# Patient Record
Sex: Female | Born: 1990
Health system: Southern US, Community
[De-identification: ages and names within clinical notes are randomized; demographics above are authoritative.]

## PROBLEM LIST (undated history)

## (undated) ENCOUNTER — Inpatient Hospital Stay (HOSPITAL_COMMUNITY): Payer: Self-pay

## (undated) DIAGNOSIS — D573 Sickle-cell trait: Secondary | ICD-10-CM

## (undated) DIAGNOSIS — F4321 Adjustment disorder with depressed mood: Secondary | ICD-10-CM

## (undated) DIAGNOSIS — T4145XA Adverse effect of unspecified anesthetic, initial encounter: Secondary | ICD-10-CM

## (undated) DIAGNOSIS — S32009A Unspecified fracture of unspecified lumbar vertebra, initial encounter for closed fracture: Secondary | ICD-10-CM

## (undated) DIAGNOSIS — Z789 Other specified health status: Secondary | ICD-10-CM

## (undated) DIAGNOSIS — F4310 Post-traumatic stress disorder, unspecified: Secondary | ICD-10-CM

## (undated) DIAGNOSIS — S329XXA Fracture of unspecified parts of lumbosacral spine and pelvis, initial encounter for closed fracture: Secondary | ICD-10-CM

## (undated) DIAGNOSIS — T8859XA Other complications of anesthesia, initial encounter: Secondary | ICD-10-CM

## (undated) DIAGNOSIS — S129XXA Fracture of neck, unspecified, initial encounter: Secondary | ICD-10-CM

## (undated) DIAGNOSIS — R87612 Low grade squamous intraepithelial lesion on cytologic smear of cervix (LGSIL): Secondary | ICD-10-CM

## (undated) DIAGNOSIS — S069XAA Unspecified intracranial injury with loss of consciousness status unknown, initial encounter: Secondary | ICD-10-CM

## (undated) DIAGNOSIS — S069X9A Unspecified intracranial injury with loss of consciousness of unspecified duration, initial encounter: Secondary | ICD-10-CM

## (undated) DIAGNOSIS — O3440 Maternal care for other abnormalities of cervix, unspecified trimester: Secondary | ICD-10-CM

---
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
## 2011-11-01 ENCOUNTER — Encounter (HOSPITAL_COMMUNITY): Payer: Self-pay | Admitting: Obstetrics and Gynecology

## 2011-11-01 ENCOUNTER — Inpatient Hospital Stay (HOSPITAL_COMMUNITY)
Admission: AD | Admit: 2011-11-01 | Discharge: 2011-11-01 | Disposition: A | Discharge status: Home / Self Care | Payer: Medicaid Other | Source: Ambulatory Visit | Attending: Obstetrics & Gynecology | Admitting: Obstetrics & Gynecology

## 2011-11-01 DIAGNOSIS — Z3202 Encounter for pregnancy test, result negative: Secondary | ICD-10-CM | POA: Insufficient documentation

## 2011-11-01 DIAGNOSIS — K5900 Constipation, unspecified: Secondary | ICD-10-CM

## 2011-11-01 HISTORY — DX: Other specified health status: Z78.9

## 2011-11-01 NOTE — MAU Provider Note (Signed)
History     CSN: [id number]  Arrival date and time: 11/01/11 1319   None     Chief Complaint  Patient presents with  . Possible Pregnancy  . Constipation   [name] 21 y.o. G0P0000   HPI  Possible pregnancy- Patient presents today to be tested for pregnancy. She reports that her abdomen has been getting bigger for about 1 month. She is also having pelvic cramping at night. She is current on the depo shot. In August she had 1 episode of vaginal bleeding and pink/orange color when she wiped. Because she has never bled with the depo she thought she was pregnant and took 2 home pregnancy tests that were positive. She went to the health department in September and was given her next depo shot. She is currently sexually active with 1 female partner. She has no concerns for STIs.  Patient has reported to the nurse that she has wanted to become pregnant.   Constipation- Patient has been having difficult bowel movements over the last month. She has had straining that at times feels like she is straining through her vaginal area. She reports having 1-2 BM per day and has had one today. She has also had bright red blood on her stool. She has had no rectal pain or itching.     OB History    Grav Para Term Preterm Abortions TAB SAB Ect Mult Living   0 0 0 0 0 0 0 0 0 0       Past Medical History  Diagnosis Date  . No pertinent past medical history     Past Surgical History  Procedure Date  . No past surgeries     No family history on file.  History  Substance Use Topics  . Smoking status: Never Smoker   . Smokeless tobacco: Not on file  . Alcohol Use: No    Allergies: No Known Allergies  Prescriptions prior to admission  Medication Sig Dispense Refill  . medroxyPROGESTERone (DEPO-PROVERA) 150 MG/ML injection Inject 150 mg into the muscle every 3 (three) months. Patient had in Sept.2013        Review of Systems  Constitutional: Negative for fever, chills and  malaise/fatigue.  Eyes: Negative for blurred vision and double vision.  Respiratory: Negative for shortness of breath.   Cardiovascular: Negative for chest pain, palpitations and leg swelling.  Gastrointestinal: Positive for nausea, vomiting, abdominal pain, constipation and blood in stool. Negative for diarrhea and melena.  Genitourinary: Negative for dysuria, urgency and frequency.  Musculoskeletal: Negative for myalgias.  Neurological: Negative for dizziness, loss of consciousness, weakness and headaches.   Physical Exam   Blood pressure 119/68, pulse 88, temperature 98.5 F (36.9 C), temperature source Oral, resp. rate 18, height 5\' 8"  (1.727 m), weight 73.936 kg (163 lb).  Physical Exam  Nursing note and vitals reviewed. Constitutional: She is oriented to person, place, and time. She appears well-developed and well-nourished. No distress.  HENT:  Head: Normocephalic and atraumatic.  Eyes: Conjunctivae normal and EOM are normal. No scleral icterus.  Neck: Normal range of motion. Neck supple.  Cardiovascular: Normal rate, regular rhythm, normal heart sounds and intact distal pulses.  Exam reveals no gallop and no friction rub.   No murmur heard. Respiratory: Effort normal and breath sounds normal. No respiratory distress. She has no wheezes. She has no rales.  GI: Soft. Bowel sounds are normal. She exhibits no distension and no mass. There is no hepatosplenomegaly. There is no tenderness. There is  no rebound and no guarding.  Genitourinary: Rectum normal and uterus normal. Uterus is not enlarged and not tender.       No stool felt in rectal vault.   Musculoskeletal: Normal range of motion. She exhibits no edema and no tenderness.  Neurological: She is alert and oriented to person, place, and time.  Skin: Skin is warm and dry. No rash noted. She is not diaphoretic. No erythema.    MAU Course  Procedures  Results for orders placed during the hospital encounter of 11/01/11 (from  the past 24 hour(s))  POCT PREGNANCY, URINE     Status: Normal   Collection Time   11/01/11  1:48 PM      Component Value Range   Preg Test, Ur NEGATIVE  NEGATIVE    Urine pregnancy test was negative. We offered to do a blood pregnancy test however the patient declined. Patient expressed to the nurse that she had wanted to become pregnant. She now appears upset that she is not.   Assessment and Plan  Negative pregnancy- Urine pregnancy test indicated that the patient is not pregnant. She is visibly upset and declined having any further testing. Patient will be discharged.   Constipation- Patient is having symptoms of constipation. There were no signs of acute abdominal abnormality. She was instructed to try using Colace.   Winfield Cunas 11/01/2011, 2:48 PM   Evaluation and management procedures were performed by PA-S under my supervision/collaboration. Chart reviewed, patient examined by me and I agree with management and plan. spoke with her regarding desire to become pregnant and stopping Depo. I offerred her a blood test for pregnancy and she declined. Danae Orleans, CNM 11/01/2011 3:59 PM

## 2011-11-01 NOTE — MAU Note (Signed)
Pt presents to MAU with chief complaint of constipation and concerns about pregnancy. Pt is using depo for birth control and is concerned that she is pregnant. Pt says her stomach seems bigger and has been having a difficult time using the bathroom to have a bowel movement.  Pt had a BM yesterday that was hard and difficult to pass.  Pt denies bleeding at this time. Requests blood test for pregnancy

## 2011-11-01 NOTE — MAU Note (Signed)
Past 2 months stomach has been getting bigger. Has been on depo (last was 09/13). Never had periods, had some bleeding- in August.  Went in Sept, test was negative (pee test), got another depo.  Is now concerned that she may be pregnant.  occ has cramping- none today.

## 2012-06-22 ENCOUNTER — Emergency Department (INDEPENDENT_AMBULATORY_CARE_PROVIDER_SITE_OTHER)
Admission: EM | Admit: 2012-06-22 | Discharge: 2012-06-22 | Disposition: A | Discharge status: Home / Self Care | Payer: Self-pay | Source: Home / Self Care | Attending: Family Medicine | Admitting: Family Medicine

## 2012-06-22 ENCOUNTER — Encounter (HOSPITAL_COMMUNITY): Payer: Self-pay | Admitting: *Deleted

## 2012-06-22 DIAGNOSIS — L42 Pityriasis rosea: Secondary | ICD-10-CM

## 2012-06-22 MED ORDER — LORATADINE 10 MG PO TABS: 10.0000 mg | ORAL_TABLET | Freq: Every day | ORAL | Status: DC

## 2012-06-22 NOTE — ED Notes (Addendum)
C/o rash started on L upper arm on Monday and then spread all over her body with itching.  Pt. Thought it was an allergic reaction.  It appears like Pityriasis Rosea. Accidentally charted under Nira Conn RN name. Vassie Moselle 06/22/2012

## 2012-06-22 NOTE — ED Provider Notes (Signed)
History     CSN: [id number]  Arrival date & time 06/22/12  1507   First MD Initiated Contact with Patient 06/22/12 1646      Chief Complaint  Patient presents with  . Rash    (Consider location/radiation/quality/duration/timing/severity/associated sxs/prior treatment) HPI Comments: Pt developed rash 5 days ago. First a large patch on L upper inner arm appeared, then hundreds of smaller lesions appeared on the rest of her body.  The lesions are mildly itchy. No other aggravating factors or associated sx.   Patient is a 22 y.o. female presenting with rash. The history is provided by the patient.  Rash Relieved by:  None tried Worsened by:  Nothing tried Ineffective treatments:  None tried Associated symptoms: no chills and no fever     Past Medical History  Diagnosis Date  . No pertinent past medical history     Past Surgical History  Procedure Laterality Date  . No past surgeries      History reviewed. No pertinent family history.  History  Substance Use Topics  . Smoking status: Never Smoker   . Smokeless tobacco: Not on file  . Alcohol Use: No    OB History   Grav Para Term Preterm Abortions TAB SAB Ect Mult Living   0 0 0 0 0 0 0 0 0 0       Review of Systems  Constitutional: Negative for fever and chills.  Skin: Positive for rash.    Allergies  Review of patient's allergies indicates no known allergies.  Home Medications   Current Outpatient Rx  Name  Route  Sig  Dispense  Refill  . loratadine (CLARITIN) 10 MG tablet   Oral   Take 1 tablet (10 mg total) by mouth daily.   30 tablet   2   . medroxyPROGESTERone (DEPO-PROVERA) 150 MG/ML injection   Intramuscular   Inject 150 mg into the muscle every 3 (three) months. Patient had in Sept.2013           BP 108/65  Pulse 72  Temp(Src) 98.9 F (37.2 C) (Oral)  Resp 16  SpO2 100%  Physical Exam  Constitutional: She appears well-developed and well-nourished. No distress.  Skin: Skin is warm  and dry. Rash noted. Rash is maculopapular.  Larger 2cm diameter patch on L upper inner arm. Hundreds of scattered smaller lesions on rest of body including face.  Rash is slightly raised, some with scaly border. Christmas tree pattern is noted in rash distribution on back.  C/w pityriasis rosea.     ED Course  Procedures (including critical care time)  Labs Reviewed - No data to display No results found.   1. Pityriasis rosea       MDM  rx claritin 10mg  po daily #30, 2 refills.        Cathlyn Parsons, NP 06/22/12 1655

## 2012-06-23 NOTE — ED Provider Notes (Signed)
Medical screening examination/treatment/procedure(s) were performed by resident physician or non-physician practitioner and as supervising physician I was immediately available for consultation/collaboration.   Terrin Meddaugh DOUGLAS MD.   Brando Taves D Cane Dubray, MD 06/23/12 1109 

## 2013-06-21 ENCOUNTER — Inpatient Hospital Stay (HOSPITAL_COMMUNITY)
Admission: AD | Admit: 2013-06-21 | Discharge: 2013-06-21 | Disposition: A | Discharge status: Home / Self Care | Payer: Medicaid Other | Source: Ambulatory Visit | Attending: Obstetrics & Gynecology | Admitting: Obstetrics & Gynecology

## 2013-06-21 ENCOUNTER — Encounter (HOSPITAL_COMMUNITY): Payer: Self-pay | Admitting: *Deleted

## 2013-06-21 DIAGNOSIS — N390 Urinary tract infection, site not specified: Secondary | ICD-10-CM | POA: Insufficient documentation

## 2013-06-21 DIAGNOSIS — R109 Unspecified abdominal pain: Secondary | ICD-10-CM | POA: Insufficient documentation

## 2013-06-21 DIAGNOSIS — Z3202 Encounter for pregnancy test, result negative: Secondary | ICD-10-CM | POA: Insufficient documentation

## 2013-06-21 DIAGNOSIS — Z87891 Personal history of nicotine dependence: Secondary | ICD-10-CM | POA: Insufficient documentation

## 2013-06-21 LAB — URINALYSIS, ROUTINE W REFLEX MICROSCOPIC
BILIRUBIN URINE: NEGATIVE
Glucose, UA: NEGATIVE mg/dL
KETONES UR: NEGATIVE mg/dL
NITRITE: POSITIVE — AB
PH: 6 (ref 5.0–8.0)
Protein, ur: NEGATIVE mg/dL
Specific Gravity, Urine: 1.01 (ref 1.005–1.030)
Urobilinogen, UA: 0.2 mg/dL (ref 0.0–1.0)

## 2013-06-21 LAB — POCT PREGNANCY, URINE: PREG TEST UR: NEGATIVE

## 2013-06-21 LAB — ABO/RH: ABO/RH(D): A POS

## 2013-06-21 LAB — CBC
HEMATOCRIT: 29.3 % — AB (ref 36.0–46.0)
HEMOGLOBIN: 10.7 g/dL — AB (ref 12.0–15.0)
MCH: 30.6 pg (ref 26.0–34.0)
MCHC: 36.5 g/dL — ABNORMAL HIGH (ref 30.0–36.0)
MCV: 83.7 fL (ref 78.0–100.0)
PLATELETS: 324 10*3/uL (ref 150–400)
RBC: 3.5 MIL/uL — AB (ref 3.87–5.11)
RDW: 15 % (ref 11.5–15.5)
WBC: 13.2 10*3/uL — AB (ref 4.0–10.5)

## 2013-06-21 LAB — URINE MICROSCOPIC-ADD ON

## 2013-06-21 LAB — HCG, QUANTITATIVE, PREGNANCY: hCG, Beta Chain, Quant, S: 1 m[IU]/mL (ref <5)

## 2013-06-21 MED ORDER — CIPROFLOXACIN HCL 500 MG PO TABS: 500.0000 mg | ORAL_TABLET | Freq: Two times a day (BID) | ORAL | Status: DC

## 2013-06-21 NOTE — MAU Note (Addendum)
PT  SAYS SHER LMP  WAS 5-2.    SHE HAS DONE  2 HPT-    POSITIVE.     VOMITING   STARTED TODAY.  LAST SEX-   YESTERDAY.  LAST DEPO -  WAS 2014.    NO DR-  [facility]  [facility]-   DONE LAST YEAR.    CRAMPING  STARTED AT 0300  WHEN SHE STARTED VOMITING.

## 2013-06-21 NOTE — Discharge Instructions (Signed)
Urinary Tract Infection  Urinary tract infections (UTIs) can develop anywhere along your urinary tract. Your urinary tract is your body's drainage system for removing wastes and extra water. Your urinary tract includes two kidneys, two ureters, a bladder, and a urethra. Your kidneys are a pair of bean-shaped organs. Each kidney is about the size of your fist. They are located below your ribs, one on each side of your spine.  CAUSES  Infections are caused by microbes, which are microscopic organisms, including fungi, viruses, and bacteria. These organisms are so small that they can only be seen through a microscope. Bacteria are the microbes that most commonly cause UTIs.  SYMPTOMS   Symptoms of UTIs may vary by age and gender of the patient and by the location of the infection. Symptoms in young women typically include a frequent and intense urge to urinate and a painful, burning feeling in the bladder or urethra during urination. Older women and men are more likely to be tired, shaky, and weak and have muscle aches and abdominal pain. A fever may mean the infection is in your kidneys. Other symptoms of a kidney infection include pain in your back or sides below the ribs, nausea, and vomiting.  DIAGNOSIS  To diagnose a UTI, your caregiver will ask you about your symptoms. Your caregiver also will ask to provide a urine sample. The urine sample will be tested for bacteria and white blood cells. White blood cells are made by your body to help fight infection.  TREATMENT   Typically, UTIs can be treated with medication. Because most UTIs are caused by a bacterial infection, they usually can be treated with the use of antibiotics. The choice of antibiotic and length of treatment depend on your symptoms and the type of bacteria causing your infection.  HOME CARE INSTRUCTIONS   If you were prescribed antibiotics, take them exactly as your caregiver instructs you. Finish the medication even if you feel better after you  have only taken some of the medication.   Drink enough water and fluids to keep your urine clear or pale yellow.   Avoid caffeine, tea, and carbonated beverages. They tend to irritate your bladder.   Empty your bladder often. Avoid holding urine for long periods of time.   Empty your bladder before and after sexual intercourse.   After a bowel movement, women should cleanse from front to back. Use each tissue only once.  SEEK MEDICAL CARE IF:    You have back pain.   You develop a fever.   Your symptoms do not begin to resolve within 3 days.  SEEK IMMEDIATE MEDICAL CARE IF:    You have severe back pain or lower abdominal pain.   You develop chills.   You have nausea or vomiting.   You have continued burning or discomfort with urination.  MAKE SURE YOU:    Understand these instructions.   Will watch your condition.   Will get help right away if you are not doing well or get worse.  Document Released: 10/20/2004 Document Revised: 07/12/2011 Document Reviewed: 02/18/2011  Exit[employer] Patient Information 2014 [employer].

## 2013-06-21 NOTE — MAU Provider Note (Signed)
History     CSN: [id number]  Arrival date and time: 06/21/13 0786   First Provider Initiated Contact with Patient 06/21/13 [phone]      No chief complaint on file.  HPI [name] is a 23 y.o. G1P0000 who presents today with lower abdominal pain. She states that she took a pregnancy test at home, and it was positive. Her LMP was 05/25/13.   Past Medical History  Diagnosis Date  . No pertinent past medical history   . Medical history non-contributory     Past Surgical History  Procedure Laterality Date  . No past surgeries      History reviewed. No pertinent family history.  History  Substance Use Topics  . Smoking status: Former Games developer  . Smokeless tobacco: Not on file  . Alcohol Use: No    Allergies: No Known Allergies  Prescriptions prior to admission  Medication Sig Dispense Refill  . loratadine (CLARITIN) 10 MG tablet Take 1 tablet (10 mg total) by mouth daily.  30 tablet  2  . medroxyPROGESTERone (DEPO-PROVERA) 150 MG/ML injection Inject 150 mg into the muscle every 3 (three) months. Patient had in Sept.2013        ROS Physical Exam   Blood pressure 114/66, pulse 87, temperature 98.4 F (36.9 C), temperature source Oral, resp. rate 20, height 5\' 8"  (1.727 m), weight 82.555 kg (182 lb), last menstrual period 05/25/2013, unknown if currently breastfeeding.  Physical Exam  Nursing note and vitals reviewed. Constitutional: She is oriented to person, place, and time. She appears well-developed and well-nourished. No distress.  Cardiovascular: Normal rate.   Respiratory: Effort normal.  GI: Soft. There is no tenderness. There is no rebound.  Neurological: She is alert and oriented to person, place, and time.  Skin: Skin is warm and dry.  Psychiatric: She has a normal mood and affect.    MAU Course  Procedures  Results for orders placed during the hospital encounter of 06/21/13 (from the past 24 hour(s))  URINALYSIS, ROUTINE W REFLEX MICROSCOPIC     Status:  Abnormal   Collection Time    06/21/13  5:30 AM      Result Value Ref Range   Color, Urine YELLOW  YELLOW   APPearance HAZY (*) CLEAR   Specific Gravity, Urine 1.010  1.005 - 1.030   pH 6.0  5.0 - 8.0   Glucose, UA NEGATIVE  NEGATIVE mg/dL   Hgb urine dipstick TRACE (*) NEGATIVE   Bilirubin Urine NEGATIVE  NEGATIVE   Ketones, ur NEGATIVE  NEGATIVE mg/dL   Protein, ur NEGATIVE  NEGATIVE mg/dL   Urobilinogen, UA 0.2  0.0 - 1.0 mg/dL   Nitrite POSITIVE (*) NEGATIVE   Leukocytes, UA SMALL (*) NEGATIVE  URINE MICROSCOPIC-ADD ON     Status: Abnormal   Collection Time    06/21/13  5:30 AM      Result Value Ref Range   Squamous Epithelial / LPF MANY (*) RARE   WBC, UA 7-10  <3 WBC/hpf   Bacteria, UA MANY (*) RARE  HCG, QUANTITATIVE, PREGNANCY     Status: None   Collection Time    06/21/13  6:00 AM      Result Value Ref Range   hCG, Beta Chain, Quant, S 1  <5 mIU/mL  CBC     Status: Abnormal   Collection Time    06/21/13  6:00 AM      Result Value Ref Range   WBC 13.2 (*) 4.0 - 10.5 K/uL  RBC 3.50 (*) 3.87 - 5.11 MIL/uL   Hemoglobin 10.7 (*) 12.0 - 15.0 g/dL   HCT 16.129.3 (*) 09.636.0 - 04.546.0 %   MCV 83.7  78.0 - 100.0 fL   MCH 30.6  26.0 - 34.0 pg   MCHC 36.5 (*) 30.0 - 36.0 g/dL   RDW 40.915.0  81.111.5 - 91.415.5 %   Platelets 324  150 - 400 K/uL  ABO/RH     Status: None   Collection Time    06/21/13  6:00 AM      Result Value Ref Range   ABO/RH(D) A POS       Assessment and Plan   1. UTI (lower urinary tract infection)      Medication List         ciprofloxacin 500 MG tablet  Commonly known as:  CIPRO  Take 1 tablet (500 mg total) by mouth 2 (two) times daily.       Follow-up Information   Follow up with [facility]. (If symptoms worsen)    Specialty:  Emergency Medicine   Contact information:   [id number] [place] [id number] [phone]       Tawnya CrookHeather Donovan Hogan 06/21/2013, 7:12 AM

## 2013-06-21 NOTE — MAU Note (Signed)
NOT IN LOBBBY

## 2013-06-26 NOTE — MAU Provider Note (Signed)
Attestation of Attending Supervision of Advanced Practitioner (CNM/NP): Evaluation and management procedures were performed by the Advanced Practitioner under my supervision and collaboration. I have reviewed the Advanced Practitioner's note and chart, and I agree with the management and plan.  Lesly Dukes 9:57 AM

## 2013-11-25 ENCOUNTER — Encounter (HOSPITAL_COMMUNITY): Payer: Self-pay | Admitting: *Deleted

## 2014-06-11 ENCOUNTER — Encounter (HOSPITAL_COMMUNITY): Payer: Self-pay | Admitting: *Deleted

## 2014-06-11 ENCOUNTER — Emergency Department (HOSPITAL_COMMUNITY)
Admission: EM | Admit: 2014-06-11 | Discharge: 2014-06-11 | Disposition: A | Discharge status: Home / Self Care | Payer: BLUE CROSS/BLUE SHIELD | Attending: Emergency Medicine | Admitting: Emergency Medicine

## 2014-06-11 DIAGNOSIS — K088 Other specified disorders of teeth and supporting structures: Secondary | ICD-10-CM | POA: Diagnosis present

## 2014-06-11 DIAGNOSIS — Z87891 Personal history of nicotine dependence: Secondary | ICD-10-CM | POA: Diagnosis not present

## 2014-06-11 DIAGNOSIS — K0520 Aggressive periodontitis, unspecified: Secondary | ICD-10-CM | POA: Insufficient documentation

## 2014-06-11 DIAGNOSIS — Z792 Long term (current) use of antibiotics: Secondary | ICD-10-CM | POA: Diagnosis not present

## 2014-06-11 MED ORDER — AMOXICILLIN 500 MG PO CAPS: 500.0000 mg | ORAL_CAPSULE | Freq: Three times a day (TID) | ORAL | Status: DC

## 2014-06-11 MED ORDER — NAPROXEN 500 MG PO TABS: 500.0000 mg | ORAL_TABLET | Freq: Two times a day (BID) | ORAL | Status: DC

## 2014-06-11 MED ORDER — HYDROCODONE-ACETAMINOPHEN 5-325 MG PO TABS: 1.0000 | ORAL_TABLET | Freq: Four times a day (QID) | ORAL | Status: DC | PRN

## 2014-06-11 NOTE — ED Provider Notes (Signed)
CSN: [id number]     Arrival date & time 06/11/14  1501 History  This chart was scribed for Kerrie BuffaloHope Neese, NP working with No att. providers found by Elveria Risingimelie Horne, ED Scribe. This patient was seen in room TR06C/TR06C and the patient's care was started at 3:14 PM.   Chief Complaint  Patient presents with  . Dental Pain   The history is provided by the patient. No language interpreter was used.   HPI Comments: Margart SicklesJasmine Delahunt is a 24 y.o. female who presents to the Emergency Department complaining of dental pain at lower left wisdom tooth. Patient reports severe pain upon wakening this morning mildly relieved with treatment of Advil. Patient states that she experiences her worst pain at night which endures into the morning. Patient denies recent cold symptoms including cough, congestion, ear pain, or sore throat. Patient denies additional medical complaints.    Past Medical History  Diagnosis Date  . No pertinent past medical history   . Medical history non-contributory    Past Surgical History  Procedure Laterality Date  . No past surgeries     History reviewed. No pertinent family history. History  Substance Use Topics  . Smoking status: Former Games developermoker  . Smokeless tobacco: Not on file  . Alcohol Use: No   OB History    Gravida Para Term Preterm AB TAB SAB Ectopic Multiple Living   1 0 0 0 0 0 0 0 0 0      Review of Systems  Constitutional: Negative for fever and chills.  HENT: Positive for dental problem. Negative for ear pain, sore throat and trouble swallowing.   Gastrointestinal: Negative for nausea, vomiting and diarrhea.  Musculoskeletal: Negative for neck pain.  Neurological: Negative for headaches.      Allergies  Review of patient's allergies indicates no known allergies.  Home Medications   Prior to Admission medications   Medication Sig Start Date End Date Taking? Authorizing Provider  amoxicillin (AMOXIL) 500 MG capsule Take 1 capsule (500 mg total) by mouth 3  (three) times daily. 06/11/14   [name], NP  ciprofloxacin (CIPRO) 500 MG tablet Take 1 tablet (500 mg total) by mouth 2 (two) times daily. 06/21/13   Armando ReichertHeather D Hogan, CNM  HYDROcodone-acetaminophen (NORCO) 5-325 MG per tablet Take 1 tablet by mouth every 6 (six) hours as needed for moderate pain. 06/11/14   [name], NP  naproxen (NAPROSYN) 500 MG tablet Take 1 tablet (500 mg total) by mouth 2 (two) times daily. 06/11/14   [name], NP   BP 119/61 mmHg  Pulse 74  Temp(Src) 98.9 F (37.2 C) (Oral)  Resp 22  SpO2 99% Physical Exam  Constitutional: She is oriented to person, place, and time. She appears well-developed and well-nourished.  HENT:  Head: Atraumatic.  Right Ear: Tympanic membrane normal.  Left Ear: Tympanic membrane normal.  Mouth/Throat: Oropharynx is clear and moist.  Wisdom tooth partially erupted with red, swollen surrounding gum.  Eyes: EOM are normal.  Neck: Neck supple.  Anterior cervical node, palpable on left.   Cardiovascular: Normal rate and regular rhythm.   Pulmonary/Chest: Effort normal and breath sounds normal.  Abdominal: Soft. There is no tenderness.  Musculoskeletal: Normal range of motion.  Neurological: She is alert and oriented to person, place, and time. No cranial nerve deficit.  Skin: Skin is warm and dry.  Nursing note and vitals reviewed.   ED Course  Procedures (including critical care time)  COORDINATION OF CARE: 3:22 PM- will  prescribe antibiotic and narcotic. Discussed treatment plan with patient at bedside and patient agreed to plan.    MDM  24 y.o. female with dental pain due to partial eruption of the lower left third molar that appears to have infection. Stable for d/c without fever and does not appear toxic. Discussed with the patient clinical findings and plan of care and all questioned fully answered. She will follow up with a dentist or return here if any problems arise.  Final diagnoses:  Acute pericoronitis   I  personally performed the services described in this documentation, which was scribed in my presence. The recorded information has been reviewed and is accurate.    [address] Neese, NP 06/11/14 1806  Eber HongBrian Miller, MD 06/12/14 [phone]

## 2014-06-11 NOTE — ED Notes (Signed)
Pt c/o left lower dental pain. No other complaints.

## 2014-06-11 NOTE — Discharge Instructions (Signed)
See a dentist as soon as possible. Do not drive or go to school when taking the narcotic.

## 2014-07-16 ENCOUNTER — Emergency Department (INDEPENDENT_AMBULATORY_CARE_PROVIDER_SITE_OTHER)
Admission: EM | Admit: 2014-07-16 | Discharge: 2014-07-16 | Disposition: A | Discharge status: Home / Self Care | Payer: BLUE CROSS/BLUE SHIELD | Source: Home / Self Care | Attending: Family Medicine | Admitting: Family Medicine

## 2014-07-16 ENCOUNTER — Encounter (HOSPITAL_COMMUNITY): Payer: Self-pay | Admitting: Emergency Medicine

## 2014-07-16 DIAGNOSIS — K088 Other specified disorders of teeth and supporting structures: Secondary | ICD-10-CM

## 2014-07-16 DIAGNOSIS — K0889 Other specified disorders of teeth and supporting structures: Secondary | ICD-10-CM

## 2014-07-16 MED ORDER — NAPROXEN 500 MG PO TABS: 500.0000 mg | ORAL_TABLET | Freq: Two times a day (BID) | ORAL | Status: DC

## 2014-07-16 MED ORDER — HYDROCODONE-ACETAMINOPHEN 5-325 MG PO TABS: 1.0000 | ORAL_TABLET | Freq: Four times a day (QID) | ORAL | Status: DC | PRN

## 2014-07-16 MED ORDER — AMOXICILLIN 500 MG PO CAPS: 500.0000 mg | ORAL_CAPSULE | Freq: Three times a day (TID) | ORAL | Status: DC

## 2014-07-16 NOTE — ED Provider Notes (Signed)
[name] is a 24 y.o. female who presents to Urgent Care today for tooth pain. Patient has severe left lower tooth pain. She was seen in the emergency department a month ago for the same problem and was treated with amoxicillin naproxen and a small amount of hydrocodone. This helped until recently the pain came back. No fevers or chills. She does not have dental insurance nor dentist. She has not attempted to contact the dentist.   Past Medical History  Diagnosis Date  . No pertinent past medical history   . Medical history non-contributory    Past Surgical History  Procedure Laterality Date  . No past surgeries     History  Substance Use Topics  . Smoking status: Former Games developer  . Smokeless tobacco: Not on file  . Alcohol Use: No   ROS as above Medications: No current facility-administered medications for this encounter.   Current Outpatient Prescriptions  Medication Sig Dispense Refill  . amoxicillin (AMOXIL) 500 MG capsule Take 1 capsule (500 mg total) by mouth 3 (three) times daily. 30 capsule 0  . HYDROcodone-acetaminophen (NORCO) 5-325 MG per tablet Take 1 tablet by mouth every 6 (six) hours as needed for moderate pain. 6 tablet 0  . naproxen (NAPROSYN) 500 MG tablet Take 1 tablet (500 mg total) by mouth 2 (two) times daily. 30 tablet 0   No Known Allergies   Exam:  BP 116/74 mmHg  Pulse 105  Temp(Src) 99.1 F (37.3 C) (Oral)  Resp 12  SpO2 97% Gen: Well NAD HEENT: EOMI,  MMM l just posterior to the left lower rear most molar tender area. Lungs: Normal work of breathing. CTABL Heart: RRR no MRG Abd: NABS, Soft. Nondistended, Nontender Exts: Brisk capillary refill, warm and well perfused.   No results found for this or any previous visit (from the past 24 hour(s)). No results found.  Assessment and Plan: 24 y.o. female with tooth pain likely impacted wisdom tooth. Treat with amoxicillin naproxen and 6 tablets of hydrocodone. Gave patient information for  dentist. Informed her that this problem will continue to recur until she gets it addressed definitively.  Discussed warning signs or symptoms. Please see discharge instructions. Patient expresses understanding.     Rodolph Bong, MD 07/16/14 [phone]

## 2014-07-16 NOTE — Discharge Instructions (Signed)
Thank you for coming in today. Call or go to the emergency room if you get worse, have trouble breathing, have chest pains, or palpitations.  Please contact a dentist ASAP.  This problem will keep coming back until you get it taking care of.    Dental Pain A tooth ache may be caused by cavities (tooth decay). Cavities expose the nerve of the tooth to air and hot or cold temperatures. It may come from an infection or abscess (also called a boil or furuncle) around your tooth. It is also often caused by dental caries (tooth decay). This causes the pain you are having. DIAGNOSIS  Your caregiver can diagnose this problem by exam. TREATMENT   If caused by an infection, it may be treated with medications which kill germs (antibiotics) and pain medications as prescribed by your caregiver. Take medications as directed.  Only take over-the-counter or prescription medicines for pain, discomfort, or fever as directed by your caregiver.  Whether the tooth ache today is caused by infection or dental disease, you should see your dentist as soon as possible for further care. SEEK MEDICAL CARE IF: The exam and treatment you received today has been provided on an emergency basis only. This is not a substitute for complete medical or dental care. If your problem worsens or new problems (symptoms) appear, and you are unable to meet with your dentist, call or return to this location. SEEK IMMEDIATE MEDICAL CARE IF:   You have a fever.  You develop redness and swelling of your face, jaw, or neck.  You are unable to open your mouth.  You have severe pain uncontrolled by pain medicine. MAKE SURE YOU:   Understand these instructions.  Will watch your condition.  Will get help right away if you are not doing well or get worse. Document Released: 01/10/2005 Document Revised: 04/04/2011 Document Reviewed: 08/29/2007 [facility] Patient Information 2015 [place], [place]. This information is not intended to  replace advice given to you by your health care provider. Make sure you discuss any questions you have with your health care provider.   Proofreader Services:  [facility] [phone] ([phone])  [phone] [address]  Please call Dr. Lawrence Marseilles office [phone] or cell [phone] [address] [place]  Cost for tooth removal $200 includes exam, Xray, and extraction and follow up visit.  Bring list of current medications with you.   [facility] - [address] [phone]  2100 [facility]  [address] [place], [place], [place], [address]  [phone], Ext. [phone]  2nd and 4th Thursday of the month at 6:30am (Simple extractions only - no wisdom teeth or surgery) First come/First serve -First 10 clients served  [facility] [place], [place] and [place] residents only)  [address] [place], [place], [address]  [phone]  [facility]  [phone]  [facility]  [phone]  [facility] Department - [facility]  [phone]  Please call [facility] at [phone] to get the details to get your tooth pulled.

## 2014-07-16 NOTE — ED Notes (Signed)
Pt was treated a month ago in the ED for tooth pain.  She completed all medications and the pain and swelling has returned.  She has pain in her left lower jaw.

## 2014-08-18 ENCOUNTER — Encounter (HOSPITAL_COMMUNITY): Payer: Self-pay | Admitting: Emergency Medicine

## 2014-08-18 ENCOUNTER — Emergency Department (HOSPITAL_COMMUNITY)
Admission: EM | Admit: 2014-08-18 | Discharge: 2014-08-18 | Disposition: A | Discharge status: Home / Self Care | Payer: BLUE CROSS/BLUE SHIELD | Attending: Emergency Medicine | Admitting: Emergency Medicine

## 2014-08-18 DIAGNOSIS — Z3202 Encounter for pregnancy test, result negative: Secondary | ICD-10-CM | POA: Diagnosis not present

## 2014-08-18 DIAGNOSIS — B9689 Other specified bacterial agents as the cause of diseases classified elsewhere: Secondary | ICD-10-CM

## 2014-08-18 DIAGNOSIS — N949 Unspecified condition associated with female genital organs and menstrual cycle: Secondary | ICD-10-CM

## 2014-08-18 DIAGNOSIS — N72 Inflammatory disease of cervix uteri: Secondary | ICD-10-CM

## 2014-08-18 DIAGNOSIS — R350 Frequency of micturition: Secondary | ICD-10-CM | POA: Diagnosis present

## 2014-08-18 DIAGNOSIS — Z87891 Personal history of nicotine dependence: Secondary | ICD-10-CM | POA: Insufficient documentation

## 2014-08-18 DIAGNOSIS — N760 Acute vaginitis: Secondary | ICD-10-CM

## 2014-08-18 LAB — URINALYSIS, ROUTINE W REFLEX MICROSCOPIC
BILIRUBIN URINE: NEGATIVE
GLUCOSE, UA: NEGATIVE mg/dL
HGB URINE DIPSTICK: NEGATIVE
Ketones, ur: NEGATIVE mg/dL
Nitrite: NEGATIVE
PROTEIN: NEGATIVE mg/dL
Specific Gravity, Urine: 1.013 (ref 1.005–1.030)
Urobilinogen, UA: 1 mg/dL (ref 0.0–1.0)
pH: 6 (ref 5.0–8.0)

## 2014-08-18 LAB — POC URINE PREG, ED: Preg Test, Ur: NEGATIVE

## 2014-08-18 LAB — WET PREP, GENITAL
Trich, Wet Prep: NONE SEEN
YEAST WET PREP: NONE SEEN

## 2014-08-18 LAB — URINE MICROSCOPIC-ADD ON

## 2014-08-18 MED ORDER — METRONIDAZOLE 500 MG PO TABS: 500.0000 mg | ORAL_TABLET | Freq: Two times a day (BID) | ORAL | Status: DC

## 2014-08-18 MED ORDER — AZITHROMYCIN 250 MG PO TABS
1000.0000 mg | ORAL_TABLET | Freq: Once | ORAL | Status: AC
  Administered 2014-08-18: 1000 mg via ORAL
  Filled 2014-08-18: qty 4

## 2014-08-18 MED ORDER — CEFTRIAXONE SODIUM 250 MG IJ SOLR
250.0000 mg | Freq: Once | INTRAMUSCULAR | Status: AC
  Administered 2014-08-18: 250 mg via INTRAMUSCULAR
  Filled 2014-08-18: qty 250

## 2014-08-18 NOTE — Discharge Instructions (Signed)
Take ibuprofen as needed for pain. Follow up with the health department or your GYN doctor. Your culture results will take 48 to 72 hours to result.

## 2014-08-18 NOTE — ED Provider Notes (Signed)
CSN: [id number]     Arrival date & time 08/18/14  1321 History  This chart was scribed for non-physician practitioner, Kerrie Buffalo NP-C, working with Purvis Sheffield, MD, by Abel Presto, ED Scribe. This patient was seen in room TR10C/TR10C and the patient's care was started at 4:18 PM.     Chief Complaint  Patient presents with  . Urinary Frequency     The history is provided by the patient. No language interpreter was used.   HPI Comments: [name] is a 24 y.o. female who presents to the Emergency Department complaining of mild dysuria and vaginal irritation when voiding with onset 3 days ago. Pt states she may have cut herself while shaving before onset and reports burning/stinging sensation when voiding. She reports recent dark urine. Pt last had intercourse yesterday but denies pain at that time. She has not changed partners recently. She denies fever, chills, SOB.  Past Medical History  Diagnosis Date  . No pertinent past medical history   . Medical history non-contributory    Past Surgical History  Procedure Laterality Date  . No past surgeries     No family history on file. History  Substance Use Topics  . Smoking status: Former Games developer  . Smokeless tobacco: Not on file  . Alcohol Use: No   OB History    Gravida Para Term Preterm AB TAB SAB Ectopic Multiple Living       Review of Systems  Constitutional: Negative for chills.  Respiratory: Negative for shortness of breath.   Genitourinary: Positive for dysuria, vaginal discharge, genital sores and vaginal pain (burning when voiding).  all other systems negative    Allergies  Review of patient's allergies indicates no known allergies.  Home Medications   Prior to Admission medications   Medication Sig Start Date End Date Taking? Authorizing Provider  metroNIDAZOLE (FLAGYL) 500 MG tablet Take 1 tablet (500 mg total) by mouth 2 (two) times daily. 08/18/14   Bennetta Rudden Orlene Och, NP   BP 114/62  mmHg  Pulse 66  Temp(Src) 98.3 F (36.8 C) (Oral)  Resp 16  SpO2 100%  LMP 08/07/2014 Physical Exam  Constitutional: She is oriented to person, place, and time. She appears well-developed and well-nourished. No distress.  HENT:  Head: Normocephalic and atraumatic.  Eyes: EOM are normal.  Neck: Normal range of motion.  Cardiovascular: Normal rate, regular rhythm and normal heart sounds.   Pulmonary/Chest: Effort normal and breath sounds normal.  Abdominal: Soft. She exhibits no distension. There is no tenderness.  Genitourinary: There is lesion (to labia minor) on the left labia. Uterus is not enlarged. Cervix exhibits discharge. Cervix exhibits no motion tenderness. Right adnexum displays no tenderness. Left adnexum displays no tenderness. Vaginal discharge (yellow frothy) found.  Cervix inflamed  Musculoskeletal: Normal range of motion.  Neurological: She is alert and oriented to person, place, and time.  Skin: Skin is warm and dry.  Psychiatric: She has a normal mood and affect. Her behavior is normal. Judgment normal.  Nursing note and vitals reviewed.  Chaperone present during exam  ED Course  Procedures (including critical care time) DIAGNOSTIC STUDIES: Oxygen Saturation is 97% on room air, normal by my interpretation.    COORDINATION OF CARE: 4:20 PM Discussed treatment plan with patient at beside, the patient agrees with the plan and has no further questions at this time.   Labs Review Results for orders placed or performed during the hospital encounter  of 08/18/14 (from the past 24 hour(s))  Urinalysis, Routine w reflex microscopic (not at [facility])     Status: Abnormal   Collection Time: 08/18/14  2:15 PM  Result Value Ref Range   Color, Urine YELLOW YELLOW   APPearance HAZY (A) CLEAR   Specific Gravity, Urine 1.013 1.005 - 1.030   pH 6.0 5.0 - 8.0   Glucose, UA NEGATIVE NEGATIVE mg/dL   Hgb urine dipstick NEGATIVE NEGATIVE   Bilirubin Urine NEGATIVE NEGATIVE    Ketones, ur NEGATIVE NEGATIVE mg/dL   Protein, ur NEGATIVE NEGATIVE mg/dL   Urobilinogen, UA 1.0 0.0 - 1.0 mg/dL   Nitrite NEGATIVE NEGATIVE   Leukocytes, UA SMALL (A) NEGATIVE  Urine microscopic-add on     Status: Abnormal   Collection Time: 08/18/14  2:15 PM  Result Value Ref Range   Squamous Epithelial / LPF MANY (A) RARE   WBC, UA 3-6 <3 WBC/hpf   Bacteria, UA FEW (A) RARE  POC Urine Pregnancy, ED (do NOT order at [facility])     Status: None   Collection Time: 08/18/14  2:29 PM  Result Value Ref Range   Preg Test, Ur NEGATIVE NEGATIVE  Wet prep, genital     Status: Abnormal   Collection Time: 08/18/14  4:46 PM  Result Value Ref Range   Yeast Wet Prep HPF POC NONE SEEN NONE SEEN   Trich, Wet Prep NONE SEEN NONE SEEN   Clue Cells Wet Prep HPF POC MODERATE (A) NONE SEEN   WBC, Wet Prep HPF POC MANY (A) NONE SEEN      MDM  24 y.o. female with dysuria and vaginal lesion consistent with HSV. Inflamed cervix and yellow vaginal d/c. Will treat with Rocephin 250 mg IM, Zithromax 1 gram PO and Flagyl. She will follow up with health department. Discussed with the patient and all questioned fully answered. She will return if any problems arise.  Final diagnoses:  Genital lesion, female  Bacterial vaginosis  Cervicitis   I personally performed the services described in this documentation, which was scribed in my presence. The recorded information has been reviewed and is accurate.     [address] [place], [place] 08/18/14 2022  Purvis Sheffield, MD 08/19/14 [phone]

## 2014-08-18 NOTE — ED Notes (Signed)
Pt. Stated, when I pee i burns and I did cut myself a couple of days ago when i was shaving down there.

## 2014-08-19 LAB — GC/CHLAMYDIA PROBE AMP (~~LOC~~) NOT AT ARMC
CHLAMYDIA, DNA PROBE: NEGATIVE
Neisseria Gonorrhea: NEGATIVE

## 2014-08-19 LAB — RPR: RPR Ser Ql: NONREACTIVE

## 2014-08-19 LAB — HIV ANTIBODY (ROUTINE TESTING W REFLEX): HIV SCREEN 4TH GENERATION: NONREACTIVE

## 2014-08-20 LAB — HERPES SIMPLEX VIRUS CULTURE: Culture: DETECTED

## 2014-08-21 ENCOUNTER — Telehealth: Payer: Self-pay | Admitting: Emergency Medicine

## 2014-08-21 NOTE — Telephone Encounter (Signed)
Post ED Visit - Positive Culture Follow-up: Successful Patient Follow-Up  Culture assessed and recommendations reviewed by:  Celedonio Miyamoto, Pharm.D., BCPS-AQ ID  Georgina Pillion, Pharm.D., BCPS  [place], [address].D., BCPS, AAHIVP  Estella Husk, Pharm.D., BCPS, AAHIVP  Tegan Magsam, Pharm.D.  Isaac Bliss, Pharm.D.  Positive Herpes culture   Patient discharged without antimicrobial prescription and treatment is now indicated  Organism is resistant to prescribed ED discharge antimicrobial  Patient with positive blood cultures  Changes discussed with ED provider: Santiago Glad PA New antibiotic prescription: Valtrex one gram PO BID x 10 days Called to [facility] [phone]   Contacted patient, date 08/21/14, time 1603   Jiles Harold 08/21/2014, 4:12 PM

## 2014-08-21 NOTE — Progress Notes (Signed)
ED Antimicrobial Stewardship Positive Culture Follow Up   [name] is an 24 y.o. female who presented to [facility] on 08/18/2014 with a chief complaint of  Chief Complaint  Patient presents with  . Urinary Frequency    Recent Results (from the past 720 hour(s))  Herpes simplex virus culture     Status: None   Collection Time: 08/18/14  4:44 PM  Result Value Ref Range Status   Specimen Description VAGINA  Final   Special Requests NONE  Final   Culture   Final    Herpes Simplex Type 2 detected. Performed at [facility]    Report Status 08/20/2014 FINAL  Final  Wet prep, genital     Status: Abnormal   Collection Time: 08/18/14  4:46 PM  Result Value Ref Range Status   Yeast Wet Prep HPF POC NONE SEEN NONE SEEN Final   Trich, Wet Prep NONE SEEN NONE SEEN Final   Clue Cells Wet Prep HPF POC MODERATE (A) NONE SEEN Final   WBC, Wet Prep HPF POC MANY (A) NONE SEEN Final   Patient tested positive for Herpes simplex virus.  Treat with Valtrex 1 gm PO BID x 10 days.  ED Provider: Santiago Glad, PA  Newton Pigg 08/21/2014, 8:46 AM Infectious Diseases Pharmacist Phone# [phone]

## 2015-04-28 ENCOUNTER — Encounter (HOSPITAL_COMMUNITY): Payer: Self-pay

## 2015-04-28 ENCOUNTER — Emergency Department (HOSPITAL_COMMUNITY): Payer: BLUE CROSS/BLUE SHIELD

## 2015-04-28 ENCOUNTER — Emergency Department (HOSPITAL_COMMUNITY)
Admission: EM | Admit: 2015-04-28 | Discharge: 2015-04-28 | Disposition: A | Discharge status: Home / Self Care | Payer: BLUE CROSS/BLUE SHIELD | Attending: Emergency Medicine | Admitting: Emergency Medicine

## 2015-04-28 DIAGNOSIS — Y998 Other external cause status: Secondary | ICD-10-CM | POA: Insufficient documentation

## 2015-04-28 DIAGNOSIS — S90512A Abrasion, left ankle, initial encounter: Secondary | ICD-10-CM | POA: Diagnosis not present

## 2015-04-28 DIAGNOSIS — Z87891 Personal history of nicotine dependence: Secondary | ICD-10-CM | POA: Diagnosis not present

## 2015-04-28 DIAGNOSIS — Y9389 Activity, other specified: Secondary | ICD-10-CM | POA: Insufficient documentation

## 2015-04-28 DIAGNOSIS — S99912A Unspecified injury of left ankle, initial encounter: Secondary | ICD-10-CM | POA: Diagnosis not present

## 2015-04-28 DIAGNOSIS — M546 Pain in thoracic spine: Secondary | ICD-10-CM

## 2015-04-28 DIAGNOSIS — Y92410 Unspecified street and highway as the place of occurrence of the external cause: Secondary | ICD-10-CM | POA: Insufficient documentation

## 2015-04-28 DIAGNOSIS — M25572 Pain in left ankle and joints of left foot: Secondary | ICD-10-CM

## 2015-04-28 DIAGNOSIS — S299XXA Unspecified injury of thorax, initial encounter: Secondary | ICD-10-CM | POA: Diagnosis not present

## 2015-04-28 MED ORDER — METHOCARBAMOL 500 MG PO TABS
1000.0000 mg | ORAL_TABLET | Freq: Once | ORAL | Status: AC
  Administered 2015-04-28: 1000 mg via ORAL
  Filled 2015-04-28: qty 2

## 2015-04-28 MED ORDER — OXYCODONE-ACETAMINOPHEN 5-325 MG PO TABS: 1.0000 | ORAL_TABLET | ORAL | Status: DC | PRN

## 2015-04-28 MED ORDER — KETOROLAC TROMETHAMINE 60 MG/2ML IM SOLN
60.0000 mg | Freq: Once | INTRAMUSCULAR | Status: AC
  Administered 2015-04-28: 60 mg via INTRAMUSCULAR
  Filled 2015-04-28: qty 2

## 2015-04-28 MED ORDER — METHOCARBAMOL 500 MG PO TABS: 500.0000 mg | ORAL_TABLET | Freq: Two times a day (BID) | ORAL | Status: DC

## 2015-04-28 MED ORDER — IBUPROFEN 800 MG PO TABS: 800.0000 mg | ORAL_TABLET | Freq: Three times a day (TID) | ORAL | Status: DC
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
## 2015-06-28 ENCOUNTER — Encounter (HOSPITAL_COMMUNITY): Admission: EM | Disposition: A | Discharge status: Rehabilitation Facility | Payer: Self-pay | Source: Home / Self Care

## 2015-06-28 ENCOUNTER — Emergency Department (HOSPITAL_COMMUNITY): Payer: BLUE CROSS/BLUE SHIELD

## 2015-06-28 ENCOUNTER — Inpatient Hospital Stay (HOSPITAL_COMMUNITY): Payer: BLUE CROSS/BLUE SHIELD | Admitting: Anesthesiology

## 2015-06-28 ENCOUNTER — Inpatient Hospital Stay (HOSPITAL_COMMUNITY): Payer: BLUE CROSS/BLUE SHIELD

## 2015-06-28 ENCOUNTER — Encounter (HOSPITAL_COMMUNITY): Payer: Self-pay

## 2015-06-28 ENCOUNTER — Inpatient Hospital Stay (HOSPITAL_COMMUNITY)
Admission: EM | Admit: 2015-06-28 | Discharge: 2015-07-14 | DRG: 956 | Disposition: A | Discharge status: Rehabilitation Facility | Payer: BLUE CROSS/BLUE SHIELD | Attending: Surgery | Admitting: Surgery

## 2015-06-28 DIAGNOSIS — D62 Acute posthemorrhagic anemia: Secondary | ICD-10-CM | POA: Diagnosis not present

## 2015-06-28 DIAGNOSIS — S3210XA Unspecified fracture of sacrum, initial encounter for closed fracture: Secondary | ICD-10-CM | POA: Diagnosis present

## 2015-06-28 DIAGNOSIS — Y906 Blood alcohol level of 120-199 mg/100 ml: Secondary | ICD-10-CM | POA: Diagnosis present

## 2015-06-28 DIAGNOSIS — F1010 Alcohol abuse, uncomplicated: Secondary | ICD-10-CM | POA: Diagnosis present

## 2015-06-28 DIAGNOSIS — Z978 Presence of other specified devices: Secondary | ICD-10-CM

## 2015-06-28 DIAGNOSIS — R402232 Coma scale, best verbal response, inappropriate words, at arrival to emergency department: Secondary | ICD-10-CM | POA: Diagnosis present

## 2015-06-28 DIAGNOSIS — S32119A Unspecified Zone I fracture of sacrum, initial encounter for closed fracture: Secondary | ICD-10-CM | POA: Diagnosis present

## 2015-06-28 DIAGNOSIS — S32029A Unspecified fracture of second lumbar vertebra, initial encounter for closed fracture: Secondary | ICD-10-CM | POA: Diagnosis present

## 2015-06-28 DIAGNOSIS — F17200 Nicotine dependence, unspecified, uncomplicated: Secondary | ICD-10-CM | POA: Diagnosis present

## 2015-06-28 DIAGNOSIS — S7222XA Displaced subtrochanteric fracture of left femur, initial encounter for closed fracture: Secondary | ICD-10-CM | POA: Diagnosis present

## 2015-06-28 DIAGNOSIS — S329XXA Fracture of unspecified parts of lumbosacral spine and pelvis, initial encounter for closed fracture: Secondary | ICD-10-CM | POA: Diagnosis present

## 2015-06-28 DIAGNOSIS — S27322A Contusion of lung, bilateral, initial encounter: Secondary | ICD-10-CM | POA: Diagnosis present

## 2015-06-28 DIAGNOSIS — S069X3S Unspecified intracranial injury with loss of consciousness of 1 hour to 5 hours 59 minutes, sequela: Secondary | ICD-10-CM | POA: Diagnosis not present

## 2015-06-28 DIAGNOSIS — R402342 Coma scale, best motor response, flexion withdrawal, at arrival to emergency department: Secondary | ICD-10-CM | POA: Diagnosis present

## 2015-06-28 DIAGNOSIS — F4321 Adjustment disorder with depressed mood: Secondary | ICD-10-CM | POA: Diagnosis not present

## 2015-06-28 DIAGNOSIS — S2249XA Multiple fractures of ribs, unspecified side, initial encounter for closed fracture: Secondary | ICD-10-CM

## 2015-06-28 DIAGNOSIS — S2239XA Fracture of one rib, unspecified side, initial encounter for closed fracture: Secondary | ICD-10-CM

## 2015-06-28 DIAGNOSIS — S82899B Other fracture of unspecified lower leg, initial encounter for open fracture type I or II: Secondary | ICD-10-CM

## 2015-06-28 DIAGNOSIS — S2243XS Multiple fractures of ribs, bilateral, sequela: Secondary | ICD-10-CM | POA: Diagnosis not present

## 2015-06-28 DIAGNOSIS — S069X1A Unspecified intracranial injury with loss of consciousness of 30 minutes or less, initial encounter: Secondary | ICD-10-CM | POA: Diagnosis not present

## 2015-06-28 DIAGNOSIS — T18108A Unspecified foreign body in esophagus causing other injury, initial encounter: Secondary | ICD-10-CM | POA: Diagnosis present

## 2015-06-28 DIAGNOSIS — S32432A Displaced fracture of anterior column [iliopubic] of left acetabulum, initial encounter for closed fracture: Secondary | ICD-10-CM | POA: Diagnosis present

## 2015-06-28 DIAGNOSIS — K567 Ileus, unspecified: Secondary | ICD-10-CM | POA: Diagnosis not present

## 2015-06-28 DIAGNOSIS — R509 Fever, unspecified: Secondary | ICD-10-CM

## 2015-06-28 DIAGNOSIS — Z4659 Encounter for fitting and adjustment of other gastrointestinal appliance and device: Secondary | ICD-10-CM

## 2015-06-28 DIAGNOSIS — S12600A Unspecified displaced fracture of seventh cervical vertebra, initial encounter for closed fracture: Secondary | ICD-10-CM | POA: Diagnosis present

## 2015-06-28 DIAGNOSIS — S3282XA Multiple fractures of pelvis without disruption of pelvic ring, initial encounter for closed fracture: Secondary | ICD-10-CM | POA: Diagnosis present

## 2015-06-28 DIAGNOSIS — S82832A Other fracture of upper and lower end of left fibula, initial encounter for closed fracture: Secondary | ICD-10-CM | POA: Diagnosis present

## 2015-06-28 DIAGNOSIS — Z87898 Personal history of other specified conditions: Secondary | ICD-10-CM

## 2015-06-28 DIAGNOSIS — S82142C Displaced bicondylar fracture of left tibia, initial encounter for open fracture type IIIA, IIIB, or IIIC: Principal | ICD-10-CM

## 2015-06-28 DIAGNOSIS — R402421 Glasgow coma scale score 9-12, in the field [EMT or ambulance]: Secondary | ICD-10-CM | POA: Diagnosis present

## 2015-06-28 DIAGNOSIS — S32810A Multiple fractures of pelvis with stable disruption of pelvic ring, initial encounter for closed fracture: Secondary | ICD-10-CM

## 2015-06-28 DIAGNOSIS — S9305XA Dislocation of left ankle joint, initial encounter: Secondary | ICD-10-CM | POA: Diagnosis present

## 2015-06-28 DIAGNOSIS — S022XXA Fracture of nasal bones, initial encounter for closed fracture: Secondary | ICD-10-CM | POA: Diagnosis present

## 2015-06-28 DIAGNOSIS — S72302A Unspecified fracture of shaft of left femur, initial encounter for closed fracture: Secondary | ICD-10-CM

## 2015-06-28 DIAGNOSIS — S270XXA Traumatic pneumothorax, initial encounter: Secondary | ICD-10-CM | POA: Diagnosis present

## 2015-06-28 DIAGNOSIS — S50811A Abrasion of right forearm, initial encounter: Secondary | ICD-10-CM | POA: Diagnosis present

## 2015-06-28 DIAGNOSIS — S32009A Unspecified fracture of unspecified lumbar vertebra, initial encounter for closed fracture: Secondary | ICD-10-CM | POA: Diagnosis present

## 2015-06-28 DIAGNOSIS — S3289XA Fracture of other parts of pelvis, initial encounter for closed fracture: Secondary | ICD-10-CM | POA: Diagnosis present

## 2015-06-28 DIAGNOSIS — J939 Pneumothorax, unspecified: Secondary | ICD-10-CM

## 2015-06-28 DIAGNOSIS — S8252XA Displaced fracture of medial malleolus of left tibia, initial encounter for closed fracture: Secondary | ICD-10-CM | POA: Diagnosis present

## 2015-06-28 DIAGNOSIS — S32008A Other fracture of unspecified lumbar vertebra, initial encounter for closed fracture: Secondary | ICD-10-CM | POA: Diagnosis not present

## 2015-06-28 DIAGNOSIS — R402122 Coma scale, eyes open, to pain, at arrival to emergency department: Secondary | ICD-10-CM | POA: Diagnosis present

## 2015-06-28 DIAGNOSIS — S32442A Displaced fracture of posterior column [ilioischial] of left acetabulum, initial encounter for closed fracture: Secondary | ICD-10-CM | POA: Diagnosis present

## 2015-06-28 DIAGNOSIS — M542 Cervicalgia: Secondary | ICD-10-CM

## 2015-06-28 DIAGNOSIS — S299XXA Unspecified injury of thorax, initial encounter: Secondary | ICD-10-CM

## 2015-06-28 DIAGNOSIS — Z23 Encounter for immunization: Secondary | ICD-10-CM

## 2015-06-28 DIAGNOSIS — S3282XS Multiple fractures of pelvis without disruption of pelvic ring, sequela: Secondary | ICD-10-CM | POA: Diagnosis not present

## 2015-06-28 DIAGNOSIS — F4311 Post-traumatic stress disorder, acute: Secondary | ICD-10-CM | POA: Diagnosis not present

## 2015-06-28 DIAGNOSIS — S32811A Multiple fractures of pelvis with unstable disruption of pelvic ring, initial encounter for closed fracture: Secondary | ICD-10-CM | POA: Diagnosis not present

## 2015-06-28 DIAGNOSIS — Z419 Encounter for procedure for purposes other than remedying health state, unspecified: Secondary | ICD-10-CM

## 2015-06-28 DIAGNOSIS — S32402A Unspecified fracture of left acetabulum, initial encounter for closed fracture: Secondary | ICD-10-CM | POA: Diagnosis present

## 2015-06-28 DIAGNOSIS — S31030A Puncture wound without foreign body of lower back and pelvis without penetration into retroperitoneum, initial encounter: Secondary | ICD-10-CM | POA: Diagnosis present

## 2015-06-28 DIAGNOSIS — S2243XA Multiple fractures of ribs, bilateral, initial encounter for closed fracture: Secondary | ICD-10-CM | POA: Diagnosis present

## 2015-06-28 DIAGNOSIS — S129XXA Fracture of neck, unspecified, initial encounter: Secondary | ICD-10-CM | POA: Diagnosis present

## 2015-06-28 DIAGNOSIS — J9601 Acute respiratory failure with hypoxia: Secondary | ICD-10-CM | POA: Diagnosis present

## 2015-06-28 DIAGNOSIS — IMO0002 Reserved for concepts with insufficient information to code with codable children: Secondary | ICD-10-CM

## 2015-06-28 DIAGNOSIS — S82142S Displaced bicondylar fracture of left tibia, sequela: Secondary | ICD-10-CM | POA: Diagnosis not present

## 2015-06-28 DIAGNOSIS — S129XXS Fracture of neck, unspecified, sequela: Secondary | ICD-10-CM | POA: Diagnosis not present

## 2015-06-28 HISTORY — PX: I & D EXTREMITY: SHX5045

## 2015-06-28 HISTORY — PX: EXTERNAL FIXATION LEG: SHX1549

## 2015-06-28 HISTORY — PX: CLOSED REDUCTION NASAL FRACTURE: SHX5365

## 2015-06-28 HISTORY — PX: LARYNGOSCOPY AND BRONCHOSCOPY: SHX5659

## 2015-06-28 HISTORY — PX: ESOPHAGOSCOPY: SHX5534

## 2015-06-28 LAB — CBC WITH DIFFERENTIAL/PLATELET
Basophils Absolute: 0 10*3/uL (ref 0.0–0.1)
Basophils Relative: 0 %
EOS ABS: 0 10*3/uL (ref 0.0–0.7)
Eosinophils Relative: 0 %
HEMATOCRIT: 37 % (ref 36.0–46.0)
HEMOGLOBIN: 12.9 g/dL (ref 12.0–15.0)
LYMPHS ABS: 0.9 10*3/uL (ref 0.7–4.0)
LYMPHS PCT: 7 %
MCH: 28.7 pg (ref 26.0–34.0)
MCHC: 34.9 g/dL (ref 30.0–36.0)
MCV: 82.4 fL (ref 78.0–100.0)
MONOS PCT: 5 %
Monocytes Absolute: 0.7 10*3/uL (ref 0.1–1.0)
NEUTROS PCT: 88 %
Neutro Abs: 11.1 10*3/uL — ABNORMAL HIGH (ref 1.7–7.7)
Platelets: 167 10*3/uL (ref 150–400)
RBC: 4.49 MIL/uL (ref 3.87–5.11)
RDW: 14.6 % (ref 11.5–15.5)
WBC: 12.8 10*3/uL — ABNORMAL HIGH (ref 4.0–10.5)

## 2015-06-28 LAB — I-STAT ARTERIAL BLOOD GAS, ED
ACID-BASE DEFICIT: 12 mmol/L — AB (ref 0.0–2.0)
Acid-base deficit: 16 mmol/L — ABNORMAL HIGH (ref 0.0–2.0)
BICARBONATE: 16.6 meq/L — AB (ref 20.0–24.0)
Bicarbonate: 12.6 mEq/L — ABNORMAL LOW (ref 20.0–24.0)
O2 SAT: 94 %
O2 Saturation: 97 %
PCO2 ART: 37.9 mmHg (ref 35.0–45.0)
PO2 ART: 108 mmHg — AB (ref 80.0–100.0)
Patient temperature: 96.6
TCO2: 18 mmol/L (ref 0–100)
TCO2: 14 mmol/L (ref 0–100)
pCO2 arterial: 51.6 mmHg — ABNORMAL HIGH (ref 35.0–45.0)
pH, Arterial: 7.115 — CL (ref 7.350–7.450)
pH, Arterial: 7.122 — CL (ref 7.350–7.450)
pO2, Arterial: 92 mmHg (ref 80.0–100.0)

## 2015-06-28 LAB — POCT I-STAT 7, (LYTES, BLD GAS, ICA,H+H)
ACID-BASE DEFICIT: 15 mmol/L — AB (ref 0.0–2.0)
Acid-base deficit: 10 mmol/L — ABNORMAL HIGH (ref 0.0–2.0)
Acid-base deficit: 11 mmol/L — ABNORMAL HIGH (ref 0.0–2.0)
BICARBONATE: 13.7 meq/L — AB (ref 20.0–24.0)
BICARBONATE: 16.6 meq/L — AB (ref 20.0–24.0)
Bicarbonate: 15.2 meq/L — ABNORMAL LOW (ref 20.0–24.0)
CALCIUM ION: 0.96 mmol/L — AB (ref 1.12–1.23)
CALCIUM ION: 0.76 mmol/L — AB (ref 1.12–1.23)
CALCIUM ION: 0.8 mmol/L — AB (ref 1.12–1.23)
HCT: 24 % — ABNORMAL LOW (ref 36.0–46.0)
HCT: 22 % — ABNORMAL LOW (ref 36.0–46.0)
HEMATOCRIT: 35 % — AB (ref 36.0–46.0)
Hemoglobin: 8.2 g/dL — ABNORMAL LOW (ref 12.0–15.0)
Hemoglobin: 7.5 g/dL — ABNORMAL LOW (ref 12.0–15.0)
Hemoglobin: 11.9 g/dL — ABNORMAL LOW (ref 12.0–15.0)
O2 SAT: 100 %
O2 Saturation: 100 %
O2 Saturation: 97 %
PH ART: 7.088 — AB (ref 7.350–7.450)
PO2 ART: 239 mmHg — AB (ref 80.0–100.0)
PO2 ART: 235 mmHg — AB (ref 80.0–100.0)
POTASSIUM: 3.9 mmol/L (ref 3.5–5.1)
Patient temperature: 36
Patient temperature: 34.2
Potassium: 3.8 mmol/L (ref 3.5–5.1)
Potassium: 3.7 mmol/L (ref 3.5–5.1)
SODIUM: 147 mmol/L — AB (ref 135–145)
SODIUM: 145 mmol/L (ref 135–145)
Sodium: 146 mmol/L — ABNORMAL HIGH (ref 135–145)
TCO2: 15 mmol/L (ref 0–100)
TCO2: 18 mmol/L (ref 0–100)
TCO2: 16 mmol/L (ref 0–100)
pCO2 arterial: 44.8 mmHg (ref 35.0–45.0)
pCO2 arterial: 34 mmHg — ABNORMAL LOW (ref 35.0–45.0)
pCO2 arterial: 31.6 mmHg — ABNORMAL LOW (ref 35.0–45.0)
pH, Arterial: 7.281 — ABNORMAL LOW (ref 7.350–7.450)
pH, Arterial: 7.276 — ABNORMAL LOW (ref 7.350–7.450)
pO2, Arterial: 88 mmHg (ref 80.0–100.0)

## 2015-06-28 LAB — BLOOD GAS, ARTERIAL
ACID-BASE DEFICIT: 1.9 mmol/L (ref 0.0–2.0)
Acid-base deficit: 7.6 mmol/L — ABNORMAL HIGH (ref 0.0–2.0)
BICARBONATE: 18.6 meq/L — AB (ref 20.0–24.0)
Bicarbonate: 22.9 mEq/L (ref 20.0–24.0)
Drawn by: 441371
Drawn by: 441371
FIO2: 1
FIO2: 0.8
LHR: 14 {breaths}/min
LHR: 18 {breaths}/min
O2 SAT: 95.8 %
O2 SAT: 98.6 %
PATIENT TEMPERATURE: 98.6
PATIENT TEMPERATURE: 98.6
PCO2 ART: 45.8 mmHg — AB (ref 35.0–45.0)
PCO2 ART: 42.1 mmHg (ref 35.0–45.0)
PEEP/CPAP: 5 cmH2O
PEEP: 5 cmH2O
PO2 ART: 127 mmHg — AB (ref 80.0–100.0)
TCO2: 20 mmol/L (ref 0–100)
TCO2: 24.2 mmol/L (ref 0–100)
VT: 600 mL
VT: 550 mL
pH, Arterial: 7.232 — ABNORMAL LOW (ref 7.350–7.450)
pH, Arterial: 7.354 (ref 7.350–7.450)
pO2, Arterial: 91.5 mmHg (ref 80.0–100.0)

## 2015-06-28 LAB — COMPREHENSIVE METABOLIC PANEL
ALBUMIN: 3.3 g/dL — AB (ref 3.5–5.0)
ALK PHOS: 50 U/L (ref 38–126)
ALT: 218 U/L — AB (ref 14–54)
AST: 315 U/L — AB (ref 15–41)
Anion gap: 17 — ABNORMAL HIGH (ref 5–15)
BILIRUBIN TOTAL: 1.1 mg/dL (ref 0.3–1.2)
BUN: 11 mg/dL (ref 6–20)
CALCIUM: 8 mg/dL — AB (ref 8.9–10.3)
CO2: 14 mmol/L — ABNORMAL LOW (ref 22–32)
CREATININE: 1.21 mg/dL — AB (ref 0.44–1.00)
Chloride: 109 mmol/L (ref 101–111)
GFR calc Af Amer: >60 mL/min (ref >60)
GLUCOSE: 170 mg/dL — AB (ref 65–99)
Potassium: 3.4 mmol/L — ABNORMAL LOW (ref 3.5–5.1)
Sodium: 140 mmol/L (ref 135–145)
TOTAL PROTEIN: 6.4 g/dL — AB (ref 6.5–8.1)

## 2015-06-28 LAB — BASIC METABOLIC PANEL
Anion gap: 5 (ref 5–15)
BUN: 7 mg/dL (ref 6–20)
CHLORIDE: 115 mmol/L — AB (ref 101–111)
CO2: 23 mmol/L (ref 22–32)
CREATININE: 0.85 mg/dL (ref 0.44–1.00)
Calcium: 6.7 mg/dL — ABNORMAL LOW (ref 8.9–10.3)
GFR calc Af Amer: >60 mL/min (ref >60)
GFR calc non Af Amer: >60 mL/min (ref >60)
GLUCOSE: 98 mg/dL (ref 65–99)
Potassium: 3.9 mmol/L (ref 3.5–5.1)
SODIUM: 143 mmol/L (ref 135–145)

## 2015-06-28 LAB — I-STAT CG4 LACTIC ACID, ED: LACTIC ACID, VENOUS: 7.52 mmol/L — AB (ref 0.5–2.0)

## 2015-06-28 LAB — URINALYSIS, ROUTINE W REFLEX MICROSCOPIC
BILIRUBIN URINE: NEGATIVE
Glucose, UA: NEGATIVE mg/dL
KETONES UR: NEGATIVE mg/dL
Leukocytes, UA: NEGATIVE
NITRITE: NEGATIVE
Protein, ur: 100 mg/dL — AB
SPECIFIC GRAVITY, URINE: 1.028 (ref 1.005–1.030)
pH: 6 (ref 5.0–8.0)

## 2015-06-28 LAB — CBC
HCT: 20.7 % — ABNORMAL LOW (ref 36.0–46.0)
Hemoglobin: 7.4 g/dL — ABNORMAL LOW (ref 12.0–15.0)
MCH: 30.5 pg (ref 26.0–34.0)
MCHC: 35.7 g/dL (ref 30.0–36.0)
MCV: 85.2 fL (ref 78.0–100.0)
PLATELETS: 178 10*3/uL (ref 150–400)
RBC: 2.43 MIL/uL — ABNORMAL LOW (ref 3.87–5.11)
RDW: 15.7 % — AB (ref 11.5–15.5)
WBC: 19.2 10*3/uL — AB (ref 4.0–10.5)

## 2015-06-28 LAB — I-STAT CHEM 8, ED
BUN: 11 mg/dL (ref 6–20)
CHLORIDE: 107 mmol/L (ref 101–111)
CREATININE: 1.3 mg/dL — AB (ref 0.44–1.00)
Calcium, Ion: 0.98 mmol/L — ABNORMAL LOW (ref 1.12–1.23)
Glucose, Bld: 168 mg/dL — ABNORMAL HIGH (ref 65–99)
HEMATOCRIT: 23 % — AB (ref 36.0–46.0)
Hemoglobin: 7.8 g/dL — ABNORMAL LOW (ref 12.0–15.0)
POTASSIUM: 3.3 mmol/L — AB (ref 3.5–5.1)
Sodium: 144 mmol/L (ref 135–145)
TCO2: 15 mmol/L (ref 0–100)

## 2015-06-28 LAB — PROTIME-INR
INR: 1.34 (ref 0.00–1.49)
INR: 1.38 (ref 0.00–1.49)
Prothrombin Time: 16.7 seconds — ABNORMAL HIGH (ref 11.6–15.2)
Prothrombin Time: 17.1 seconds — ABNORMAL HIGH (ref 11.6–15.2)

## 2015-06-28 LAB — ETHANOL: Alcohol, Ethyl (B): 140 mg/dL — ABNORMAL HIGH (ref <5)

## 2015-06-28 LAB — ABO/RH: ABO/RH(D): A POS

## 2015-06-28 LAB — URINE MICROSCOPIC-ADD ON

## 2015-06-28 LAB — CDS SEROLOGY

## 2015-06-28 LAB — MRSA PCR SCREENING: MRSA by PCR: NEGATIVE

## 2015-06-28 LAB — TRIGLYCERIDES: Triglycerides: 145 mg/dL (ref <150)

## 2015-06-28 SURGERY — IRRIGATION AND DEBRIDEMENT EXTREMITY
Anesthesia: General | Site: Throat

## 2015-06-28 MED ORDER — METOCLOPRAMIDE HCL 5 MG PO TABS: 5.0000 mg | ORAL_TABLET | Freq: Three times a day (TID) | ORAL | Status: DC | PRN

## 2015-06-28 MED ORDER — FENTANYL CITRATE (PF) 100 MCG/2ML IJ SOLN
50.0000 ug | Freq: Once | INTRAMUSCULAR | Status: AC
  Administered 2015-06-28: 50 ug via INTRAVENOUS

## 2015-06-28 MED ORDER — ROCURONIUM BROMIDE 100 MG/10ML IV SOLN
INTRAVENOUS | Status: DC | PRN
  Administered 2015-06-28 (×3): 50 mg via INTRAVENOUS

## 2015-06-28 MED ORDER — PHENOL 1.4 % MT LIQD
1.0000 | OROMUCOSAL | Status: DC | PRN
Start: 2015-06-28 — End: 2015-07-03

## 2015-06-28 MED ORDER — MIDAZOLAM HCL 2 MG/2ML IJ SOLN: 2.0000 mg | INTRAMUSCULAR | Status: DC | PRN

## 2015-06-28 MED ORDER — SODIUM CHLORIDE 0.9 % IV SOLN
Freq: Once | INTRAVENOUS | Status: AC
  Administered 2015-06-28: 05:00:00 via INTRAVENOUS

## 2015-06-28 MED ORDER — SENNOSIDES 8.8 MG/5ML PO SYRP: 5.0000 mL | ORAL_SOLUTION | Freq: Two times a day (BID) | ORAL | Status: DC | PRN

## 2015-06-28 MED ORDER — PNEUMOCOCCAL VAC POLYVALENT 25 MCG/0.5ML IJ INJ
0.5000 mL | INJECTION | INTRAMUSCULAR | Status: DC
  Filled 2015-06-28 (×3): qty 0.5

## 2015-06-28 MED ORDER — CEFAZOLIN SODIUM-DEXTROSE 2-4 GM/100ML-% IV SOLN
INTRAVENOUS | Status: AC
  Administered 2015-06-29: 2 g via INTRAVENOUS
  Filled 2015-06-28: qty 100

## 2015-06-28 MED ORDER — MIDAZOLAM HCL 2 MG/2ML IJ SOLN
1.0000 mg | Freq: Once | INTRAMUSCULAR | Status: AC
  Administered 2015-06-28: 1 mg via INTRAVENOUS

## 2015-06-28 MED ORDER — FENTANYL CITRATE (PF) 250 MCG/5ML IJ SOLN
INTRAMUSCULAR | Status: AC
Start: 2015-06-28 — End: 2015-06-28
  Filled 2015-06-28: qty 5

## 2015-06-28 MED ORDER — KCL IN DEXTROSE-NACL 20-5-0.45 MEQ/L-%-% IV SOLN
INTRAVENOUS | Status: DC
  Administered 2015-06-28 – 2015-07-07 (×13): via INTRAVENOUS
  Filled 2015-06-28 (×23): qty 1000

## 2015-06-28 MED ORDER — MIDAZOLAM HCL 2 MG/2ML IJ SOLN
INTRAMUSCULAR | Status: AC
  Filled 2015-06-28: qty 2

## 2015-06-28 MED ORDER — PROPOFOL 10 MG/ML IV BOLUS
INTRAVENOUS | Status: DC | PRN
  Administered 2015-06-28: 25 mg via INTRAVENOUS

## 2015-06-28 MED ORDER — DEXTROSE 5 % IV SOLN
10.0000 mg | INTRAVENOUS | Status: DC | PRN
  Administered 2015-06-28: 80 ug/min via INTRAVENOUS

## 2015-06-28 MED ORDER — PROPOFOL 10 MG/ML IV BOLUS
INTRAVENOUS | Status: AC
  Filled 2015-06-28: qty 20

## 2015-06-28 MED ORDER — MIDAZOLAM HCL 5 MG/5ML IJ SOLN
INTRAMUSCULAR | Status: DC | PRN
  Administered 2015-06-28 (×2): 2 mg via INTRAVENOUS

## 2015-06-28 MED ORDER — ACETAMINOPHEN 325 MG PO TABS
650.0000 mg | ORAL_TABLET | Freq: Four times a day (QID) | ORAL | Status: DC | PRN
  Administered 2015-06-29 – 2015-07-04 (×4): 650 mg via ORAL
  Filled 2015-06-28 (×4): qty 2

## 2015-06-28 MED ORDER — FENTANYL CITRATE (PF) 100 MCG/2ML IJ SOLN
INTRAMUSCULAR | Status: AC
  Filled 2015-06-28: qty 2

## 2015-06-28 MED ORDER — PROPOFOL 1000 MG/100ML IV EMUL
0.0000 ug/kg/min | INTRAVENOUS | Status: DC
  Administered 2015-06-28 – 2015-06-29 (×6): 50 ug/kg/min via INTRAVENOUS
  Administered 2015-06-29: 45 ug/kg/min via INTRAVENOUS
  Administered 2015-06-29 – 2015-06-30 (×4): 50 ug/kg/min via INTRAVENOUS
  Administered 2015-06-30: 40 ug/kg/min via INTRAVENOUS
  Administered 2015-06-30 (×2): 50 ug/kg/min via INTRAVENOUS
  Administered 2015-06-30: 12:00:00 via INTRAVENOUS
  Administered 2015-06-30: 40 ug/kg/min via INTRAVENOUS
  Administered 2015-07-01: 50 ug/kg/min via INTRAVENOUS
  Administered 2015-07-01: 40 ug/kg/min via INTRAVENOUS
  Filled 2015-06-28 (×18): qty 100

## 2015-06-28 MED ORDER — FAMOTIDINE IN NACL 20-0.9 MG/50ML-% IV SOLN
20.0000 mg | Freq: Two times a day (BID) | INTRAVENOUS | Status: DC
  Administered 2015-06-28 – 2015-07-09 (×13): 20 mg via INTRAVENOUS
  Filled 2015-06-28 (×21): qty 50

## 2015-06-28 MED ORDER — ANTISEPTIC ORAL RINSE SOLUTION (CORINZ)
7.0000 mL | Freq: Four times a day (QID) | OROMUCOSAL | Status: DC
  Administered 2015-06-28 – 2015-07-01 (×10): 7 mL via OROMUCOSAL

## 2015-06-28 MED ORDER — FENTANYL CITRATE (PF) 2500 MCG/50ML IJ SOLN: 25.0000 ug/h | INTRAMUSCULAR | Status: DC

## 2015-06-28 MED ORDER — BISACODYL 10 MG RE SUPP: 10.0000 mg | Freq: Every day | RECTAL | Status: DC | PRN

## 2015-06-28 MED ORDER — LACTATED RINGERS IV SOLN
INTRAVENOUS | Status: DC | PRN
  Administered 2015-06-28 (×3): via INTRAVENOUS

## 2015-06-28 MED ORDER — MIDAZOLAM HCL 2 MG/2ML IJ SOLN
2.0000 mg | INTRAMUSCULAR | Status: DC | PRN
  Administered 2015-06-29 – 2015-07-01 (×4): 2 mg via INTRAVENOUS
  Filled 2015-06-28 (×4): qty 2

## 2015-06-28 MED ORDER — PROPOFOL 10 MG/ML IV BOLUS: INTRAVENOUS | Status: DC | PRN

## 2015-06-28 MED ORDER — CEFAZOLIN SODIUM 1 G IJ SOLR
INTRAMUSCULAR | Status: DC | PRN
  Administered 2015-06-28 (×2): 2 g via INTRAMUSCULAR

## 2015-06-28 MED ORDER — SODIUM CHLORIDE 0.9 % IV SOLN
INTRAVENOUS | Status: AC | PRN
  Administered 2015-06-28: 1000 mL via INTRAVENOUS

## 2015-06-28 MED ORDER — ONDANSETRON HCL 4 MG/2ML IJ SOLN: 4.0000 mg | Freq: Four times a day (QID) | INTRAMUSCULAR | Status: DC | PRN

## 2015-06-28 MED ORDER — MIDAZOLAM HCL 2 MG/2ML IJ SOLN
INTRAMUSCULAR | Status: AC
  Administered 2015-06-28: 2 mg via INTRAVENOUS
  Filled 2015-06-28: qty 2

## 2015-06-28 MED ORDER — 0.9 % SODIUM CHLORIDE (POUR BTL) OPTIME
TOPICAL | Status: DC | PRN
  Administered 2015-06-28: 1000 mL

## 2015-06-28 MED ORDER — TETANUS-DIPHTH-ACELL PERTUSSIS 5-2.5-18.5 LF-MCG/0.5 IM SUSP
0.5000 mL | Freq: Once | INTRAMUSCULAR | Status: AC
  Administered 2015-06-28: 0.5 mL via INTRAMUSCULAR
  Filled 2015-06-28: qty 0.5

## 2015-06-28 MED ORDER — FENTANYL CITRATE (PF) 100 MCG/2ML IJ SOLN: 50.0000 ug | Freq: Once | INTRAMUSCULAR | Status: DC

## 2015-06-28 MED ORDER — LACTATED RINGERS IV SOLN
INTRAVENOUS | Status: DC | PRN
  Administered 2015-06-28: 10:00:00 via INTRAVENOUS

## 2015-06-28 MED ORDER — MENTHOL 3 MG MT LOZG: 1.0000 | LOZENGE | OROMUCOSAL | Status: DC | PRN

## 2015-06-28 MED ORDER — PANTOPRAZOLE SODIUM 40 MG PO TBEC
40.0000 mg | DELAYED_RELEASE_TABLET | Freq: Two times a day (BID) | ORAL | Status: DC
  Administered 2015-06-29 – 2015-07-14 (×19): 40 mg via ORAL
  Filled 2015-06-28 (×22): qty 1

## 2015-06-28 MED ORDER — SODIUM CHLORIDE 0.9 % IR SOLN
Status: DC | PRN
  Administered 2015-06-28: 3000 mL

## 2015-06-28 MED ORDER — FENTANYL BOLUS VIA INFUSION
50.0000 ug | INTRAVENOUS | Status: DC | PRN
  Administered 2015-06-29 – 2015-07-01 (×4): 50 ug via INTRAVENOUS
  Filled 2015-06-28: qty 50

## 2015-06-28 MED ORDER — IOPAMIDOL (ISOVUE-300) INJECTION 61%
INTRAVENOUS | Status: AC
  Administered 2015-06-28: 04:00:00
  Filled 2015-06-28: qty 100

## 2015-06-28 MED ORDER — SODIUM CHLORIDE 0.9 % IV SOLN
INTRAVENOUS | Status: DC | PRN
  Administered 2015-06-28: 09:00:00 via INTRAVENOUS

## 2015-06-28 MED ORDER — CEFAZOLIN SODIUM 1-5 GM-% IV SOLN
1.0000 g | Freq: Once | INTRAVENOUS | Status: AC
  Administered 2015-06-28: 1 g via INTRAVENOUS
  Filled 2015-06-28: qty 50

## 2015-06-28 MED ORDER — CHLORHEXIDINE GLUCONATE 0.12% ORAL RINSE (MEDLINE KIT)
15.0000 mL | Freq: Two times a day (BID) | OROMUCOSAL | Status: DC
  Administered 2015-06-28 – 2015-07-01 (×6): 15 mL via OROMUCOSAL

## 2015-06-28 MED ORDER — ENOXAPARIN SODIUM 40 MG/0.4ML ~~LOC~~ SOLN: 40.0000 mg | SUBCUTANEOUS | Status: DC

## 2015-06-28 MED ORDER — SODIUM BICARBONATE 8.4 % IV SOLN
INTRAVENOUS | Status: DC | PRN
  Administered 2015-06-28 (×2): 50 meq via INTRAVENOUS

## 2015-06-28 MED ORDER — ONDANSETRON HCL 4 MG PO TABS: 4.0000 mg | ORAL_TABLET | Freq: Four times a day (QID) | ORAL | Status: DC | PRN

## 2015-06-28 MED ORDER — PROPOFOL 10 MG/ML IV BOLUS
INTRAVENOUS | Status: DC | PRN
  Administered 2015-06-28: 50 ug/kg/min via INTRAVENOUS

## 2015-06-28 MED ORDER — FENTANYL CITRATE (PF) 100 MCG/2ML IJ SOLN
INTRAMUSCULAR | Status: DC | PRN
  Administered 2015-06-28: 250 ug via INTRAVENOUS

## 2015-06-28 MED ORDER — ROCURONIUM BROMIDE 50 MG/5ML IV SOLN
INTRAVENOUS | Status: AC | PRN
  Administered 2015-06-28: 100 mg via INTRAVENOUS

## 2015-06-28 MED ORDER — METOCLOPRAMIDE HCL 5 MG/ML IJ SOLN: 5.0000 mg | Freq: Three times a day (TID) | INTRAMUSCULAR | Status: DC | PRN

## 2015-06-28 MED ORDER — FENTANYL CITRATE (PF) 2500 MCG/50ML IJ SOLN
25.0000 ug/h | INTRAMUSCULAR | Status: DC
  Administered 2015-06-28: 50 ug/h via INTRAVENOUS
  Administered 2015-06-29: 75 ug/h via INTRAVENOUS
  Administered 2015-06-30 – 2015-07-01 (×2): 150 ug/h via INTRAVENOUS
  Filled 2015-06-28 (×5): qty 50

## 2015-06-28 MED ORDER — SODIUM CHLORIDE 0.9 % IV SOLN
INTRAVENOUS | Status: DC | PRN
  Administered 2015-06-28 (×3): via INTRAVENOUS

## 2015-06-28 MED ORDER — CEFAZOLIN SODIUM 1-5 GM-% IV SOLN
1.0000 g | Freq: Three times a day (TID) | INTRAVENOUS | Status: DC
  Filled 2015-06-28 (×2): qty 50

## 2015-06-28 MED ORDER — ETOMIDATE 2 MG/ML IV SOLN
INTRAVENOUS | Status: AC | PRN
  Administered 2015-06-28: 20 mg via INTRAVENOUS

## 2015-06-28 MED ORDER — SODIUM CHLORIDE 0.9 % IV BOLUS (SEPSIS)
1000.0000 mL | Freq: Once | INTRAVENOUS | Status: AC
  Administered 2015-06-28: 1000 mL via INTRAVENOUS

## 2015-06-28 MED ORDER — ACETAMINOPHEN 650 MG RE SUPP: 650.0000 mg | Freq: Four times a day (QID) | RECTAL | Status: DC | PRN

## 2015-06-28 MED ORDER — CEFAZOLIN SODIUM-DEXTROSE 2-4 GM/100ML-% IV SOLN: 2.0000 g | Freq: Four times a day (QID) | INTRAVENOUS | Status: DC

## 2015-06-28 MED ORDER — SODIUM CHLORIDE 0.9 % IV SOLN: Freq: Once | INTRAVENOUS | Status: DC

## 2015-06-28 MED ORDER — MIDAZOLAM HCL 2 MG/2ML IJ SOLN
2.0000 mg | Freq: Once | INTRAMUSCULAR | Status: AC
  Administered 2015-06-28: 2 mg via INTRAVENOUS

## 2015-06-28 MED ORDER — FENTANYL BOLUS VIA INFUSION: 50.0000 ug | INTRAVENOUS | Status: DC | PRN

## 2015-06-28 SURGICAL SUPPLY — 92 items
BANDAGE ELASTIC 4 VELCRO ST LF (GAUZE/BANDAGES/DRESSINGS) ×20 IMPLANT
BANDAGE ELASTIC 6 VELCRO ST LF (GAUZE/BANDAGES/DRESSINGS) ×20 IMPLANT
BANDAGE ESMARK 6X9 LF (GAUZE/BANDAGES/DRESSINGS) ×3 IMPLANT
BAR GLASS FIBER EXFX 11X350 (EXFIX) ×8 IMPLANT
BAR GLASS FIBER EXFX 11X500 (MISCELLANEOUS) ×10 IMPLANT
BNDG COHESIVE 6X5 TAN STRL LF (GAUZE/BANDAGES/DRESSINGS) ×5 IMPLANT
BNDG ESMARK 6X9 LF (GAUZE/BANDAGES/DRESSINGS) ×5
BNDG GAUZE ELAST 4 BULKY (GAUZE/BANDAGES/DRESSINGS) ×10 IMPLANT
CLAMP BLUE BAR TO BAR (MISCELLANEOUS) ×10 IMPLANT
CLEANER TIP ELECTROSURG 2X2 (MISCELLANEOUS) ×5 IMPLANT
CLOSURE WOUND 1/2 X4 (GAUZE/BANDAGES/DRESSINGS) ×2
COVER MAYO STAND STRL (DRAPES) ×5 IMPLANT
COVER SURGICAL LIGHT HANDLE (MISCELLANEOUS) ×5 IMPLANT
CUFF TOURNIQUET SINGLE 18IN (TOURNIQUET CUFF) ×5 IMPLANT
CUFF TOURNIQUET SINGLE 24IN (TOURNIQUET CUFF) IMPLANT
CUFF TOURNIQUET SINGLE 34IN LL (TOURNIQUET CUFF) IMPLANT
CUFF TOURNIQUET SINGLE 44IN (TOURNIQUET CUFF) IMPLANT
DRAPE C-ARM 42X72 X-RAY (DRAPES) ×10 IMPLANT
DRAPE PROXIMA HALF (DRAPES) ×15 IMPLANT
DRAPE SURG 17X23 STRL (DRAPES) ×5 IMPLANT
DRAPE U-SHAPE 47X51 STRL (DRAPES) ×5 IMPLANT
DRSG EMULSION OIL 3X3 NADH (GAUZE/BANDAGES/DRESSINGS) ×5 IMPLANT
DRSG PAD ABDOMINAL 8X10 ST (GAUZE/BANDAGES/DRESSINGS) ×5 IMPLANT
DURAPREP 26ML APPLICATOR (WOUND CARE) ×5 IMPLANT
ELECT REM PT RETURN 9FT ADLT (ELECTROSURGICAL) ×5
ELECTRODE REM PT RTRN 9FT ADLT (ELECTROSURGICAL) ×3 IMPLANT
EVACUATOR 1/8 PVC DRAIN (DRAIN) IMPLANT
FACESHIELD WRAPAROUND (MASK) ×5 IMPLANT
GAUZE SPONGE 4X4 12PLY STRL (GAUZE/BANDAGES/DRESSINGS) ×5 IMPLANT
GAUZE XEROFORM 1X8 LF (GAUZE/BANDAGES/DRESSINGS) ×5 IMPLANT
GAUZE XEROFORM 5X9 LF (GAUZE/BANDAGES/DRESSINGS) ×5 IMPLANT
GLOVE BIOGEL PI IND STRL 7.5 (GLOVE) ×3 IMPLANT
GLOVE BIOGEL PI IND STRL 8 (GLOVE) ×3 IMPLANT
GLOVE BIOGEL PI INDICATOR 7.5 (GLOVE) ×2
GLOVE BIOGEL PI INDICATOR 8 (GLOVE) ×2
GLOVE ECLIPSE 8.0 STRL XLNG CF (GLOVE) ×5 IMPLANT
GLOVE ORTHO TXT STRL SZ7.5 (GLOVE) ×5 IMPLANT
GLOVE SURG ORTHO 8.0 STRL STRW (GLOVE) ×5 IMPLANT
GOWN STRL REIN 3XL XLG LVL4 (GOWN DISPOSABLE) ×5 IMPLANT
GOWN STRL REUS W/ TWL LRG LVL3 (GOWN DISPOSABLE) ×9 IMPLANT
GOWN STRL REUS W/TWL LRG LVL3 (GOWN DISPOSABLE) ×6
HALF PIN 5.0X160 (PIN) ×10 IMPLANT
HANDPIECE INTERPULSE COAX TIP (DISPOSABLE)
KIT BASIN OR (CUSTOM PROCEDURE TRAY) ×5 IMPLANT
KIT ROOM TURNOVER OR (KITS) ×5 IMPLANT
MANIFOLD NEPTUNE II (INSTRUMENTS) ×5 IMPLANT
NEEDLE 22X1 1/2 (OR ONLY) (NEEDLE) IMPLANT
NS IRRIG 1000ML POUR BTL (IV SOLUTION) ×5 IMPLANT
PACK ORTHO EXTREMITY (CUSTOM PROCEDURE TRAY) ×5 IMPLANT
PAD ARMBOARD 7.5X6 YLW CONV (MISCELLANEOUS) ×10 IMPLANT
PADDING CAST ABS 4INX4YD NS (CAST SUPPLIES) ×2
PADDING CAST ABS COTTON 4X4 ST (CAST SUPPLIES) ×3 IMPLANT
PADDING CAST COTTON 6X4 STRL (CAST SUPPLIES) ×5 IMPLANT
PIN CLAMP 2BAR 75MM BLUE (PIN) ×12 IMPLANT
PIN HALF 5.0X200MM (PIN) ×10 IMPLANT
PIN HALF YELLOW 5X160X35 (PIN) ×10 IMPLANT
SET HNDPC FAN SPRY TIP SCT (DISPOSABLE) IMPLANT
SPLINT NASAL THERMO PLAST (MISCELLANEOUS) ×5 IMPLANT
SPLINT PLASTER CAST XFAST 5X30 (CAST SUPPLIES) ×3 IMPLANT
SPLINT PLASTER XFAST SET 5X30 (CAST SUPPLIES) ×2
SPONGE LAP 18X18 X RAY DECT (DISPOSABLE) ×15 IMPLANT
SPONGE LAP 4X18 X RAY DECT (DISPOSABLE) ×5 IMPLANT
SPONGE SCRUB IODOPHOR (GAUZE/BANDAGES/DRESSINGS) ×5 IMPLANT
STAPLER VISISTAT 35W (STAPLE) IMPLANT
STOCKINETTE IMPERVIOUS 9X36 MD (GAUZE/BANDAGES/DRESSINGS) ×5 IMPLANT
STOCKINETTE IMPERVIOUS LG (DRAPES) ×5 IMPLANT
STRIP CLOSURE SKIN 1/2X4 (GAUZE/BANDAGES/DRESSINGS) ×8 IMPLANT
SUCTION FRAZIER HANDLE 10FR (MISCELLANEOUS)
SUCTION TUBE FRAZIER 10FR DISP (MISCELLANEOUS) IMPLANT
SUT ETHILON 3 0 PS 1 (SUTURE) ×15 IMPLANT
SUT SILK 2 0 FS (SUTURE) IMPLANT
SUT VIC AB 0 CT1 27 (SUTURE)
SUT VIC AB 0 CT1 27XBRD ANBCTR (SUTURE) IMPLANT
SUT VIC AB 2-0 CT1 27 (SUTURE)
SUT VIC AB 2-0 CT1 TAPERPNT 27 (SUTURE) IMPLANT
SYR CONTROL 10ML LL (SYRINGE) ×5 IMPLANT
TOWEL OR 17X24 6PK STRL BLUE (TOWEL DISPOSABLE) ×10 IMPLANT
TOWEL OR 17X26 10 PK STRL BLUE (TOWEL DISPOSABLE) ×10 IMPLANT
TUBE ANAEROBIC SPECIMEN COL (MISCELLANEOUS) IMPLANT
TUBE CONNECTING 12'X1/4 (SUCTIONS) ×2
TUBE CONNECTING 12X1/4 (SUCTIONS) ×8 IMPLANT
TUBING CYSTO DISP (UROLOGICAL SUPPLIES) ×5 IMPLANT
UNDERPAD 30X30 INCONTINENT (UNDERPADS AND DIAPERS) ×5 IMPLANT
WATER STERILE IRR 1000ML POUR (IV SOLUTION) ×10 IMPLANT
YANKAUER SUCT BULB TIP NO VENT (SUCTIONS) ×10 IMPLANT
bar to bat clamp (Bar) ×8 IMPLANT
blue 75mm pin clamp (Pin) ×15 IMPLANT
orange pin 5x200x65 (Pin) ×10 IMPLANT
xtrafit 11x350mm bar (Bar) ×10 IMPLANT
xtrafit 11x500mm bar (Bar) ×8 IMPLANT
yellow half pin 5x 160x55 (Pin) ×8 IMPLANT
yellow half pin 5x160x35 (Pin) ×8 IMPLANT
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
## 2015-06-28 NOTE — OR Nursing (Addendum)
Time out for Dr  Suszanne Connerseoh was done by Azucena CecilBurton and Oswin Johal [employer].
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
## 2015-06-28 NOTE — Progress Notes (Signed)
Patient is back from the OR and is hemodynamically stable.  Has an arterial line in place.  Last hemoglobin was good.  Lost lingual piercing.  Getting CXR.  Will recheck labs later.  Will give one unit of cryoprecipitate.  Marta LamasJames O. Gae BonWyatt, III, MD, FACS [phone] Trauma Surgeon
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
## 2015-06-28 NOTE — ED Notes (Signed)
See trauma narrator 

## 2015-06-28 NOTE — Anesthesia Postprocedure Evaluation (Signed)
Anesthesia Post Note  Patient: [name]  Procedure(s) Performed: Procedure(s) (LRB): IRRIGATION AND DEBRIDEMENT EXTREMITY (Left) EXTERNAL FIXATION LEG (Left) CLOSED REDUCTION NASoSepto FRACTURE (N/A) LARYNGOSCOPY AND BRONCHOSCOPY (N/A) ESOPHAGOSCOPY (N/A)  Patient location during evaluation: ICU Anesthesia Type: General Level of consciousness: sedated and patient remains intubated per anesthesia plan Vital Signs Assessment: post-procedure vital signs reviewed and stable Respiratory status: patient on ventilator - see flowsheet for VS Cardiovascular status: stable Anesthetic complications: no    Last Vitals:  Filed Vitals:   06/28/15 0630 06/28/15 0635  BP: 108/65 101/64  Pulse: 124 119  Temp: 35.8 C 35.8 C  Resp: 28 27    Last Pain: There were no vitals filed for this visit.               Alanda AmassFRIEDMAN,Onyinyechi Huante

## 2015-06-28 NOTE — Progress Notes (Signed)
   06/28/15 0900  Clinical Encounter Type  Visited With Family  Visit Type Follow-up;Spiritual support;Patient in surgery  Spiritual Encounters  Spiritual Needs Emotional  Stress Factors  Family Stress Factors Lack of knowledge  Chaplain meet Dr. Lindie SpruceWyatt and family by consult room 2 on second floor.  Instructed family that the patient was not in a room yet and still in the OR. The family was emotional but understood the information. Chaplain offered spiritual support and emotional support to family. [facility] and then decided to wait in the main waiting room until the patient was assigned to a room. Chaplain advised she would be checking back with the family throughout the day.  Rosezella FloridaLisa M Cattleya Dobratz  06/28/2015  9:04 AM
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
## 2015-06-28 NOTE — Progress Notes (Signed)
   06/28/15 2000  Clinical Encounter Type  Visited With Family  Visit Type Follow-up;Spiritual support;Social support  Spiritual Encounters  Spiritual Needs Emotional  Stress Factors  Family Stress Factors Health changes  Chaplain met with family in waiting room lobby by the [place].  Large family gathered for the patient.  Chaplain offered ministry of presence and informed them that spiritual care was available all evening.  Chaplain informed family that she would have on coming chaplain check with them later in the evening.  [name]  06/28/2015  8:05 PM
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
## 2015-06-28 NOTE — Brief Op Note (Signed)
06/28/2015  10:29 AM  PATIENT:  Margart SicklesJasmine Raymundo  25 y.o. female  PRE-OPERATIVE DIAGNOSIS:  open left tibia/fibula fracture NASAL FRACTURE  POST-OPERATIVE DIAGNOSIS:  open left tibia/fibula fracture NASAL FRACTURE  PROCEDURE:  Procedure(s): IRRIGATION AND DEBRIDEMENT EXTREMITY (Left) EXTERNAL FIXATION LEG (Left) CLOSED REDUCTION NASoSepto FRACTURE (N/A) LARYNGOSCOPY AND BRONCHOSCOPY (N/A) ESOPHAGOSCOPY (N/A)  SURGEON:  Surgeon(s) and Role: Panel 1:    * Samson FredericBrian Dexton Zwilling, MD - Primary  Panel 2:    * Newman PiesSu Teoh, MD - Primary  PHYSICIAN ASSISTANT: JAckie Bissel, [name]  ASSISTANTS: none   ANESTHESIA:   general  EBL:  Total I/O In: 8148 [I.V.:4600; Blood:3548] Out: 2250 [Urine:1500; Blood:750]  BLOOD ADMINISTERED: PRBCs, FFP  DRAINS: none   LOCAL MEDICATIONS USED:  NONE  SPECIMEN:  No Specimen  DISPOSITION OF SPECIMEN:  N/A  COUNTS:  YES  TOURNIQUET:  * No tourniquets in log *  DICTATION: .Other Dictation: Dictation Number not provided by system  PLAN OF CARE: Admit to inpatient   PATIENT DISPOSITION:  ICU - intubated and critically ill.   Delay start of Pharmacological VTE agent (>24hrs) due to surgical blood loss or risk of bleeding: yes
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
## 2015-06-28 NOTE — Progress Notes (Signed)
   06/28/15 1000  Clinical Encounter Type  Visited With Family  Visit Type Follow-up;Spiritual support;Patient in surgery  Spiritual Encounters  Spiritual Needs Emotional  Stress Factors  Family Stress Factors Lack of knowledge  Family called for Chaplain to check on status of patient.  Chaplain spoke to nurse in OR and was told to tell family patient was stable but still in OR.  Chaplain relayed information to the family. They would like an update from the doctor as soon as possible on status of patient. Chaplain will followup with family throughout the day.  [name]  06/28/2015  10:03 AM

## 2015-06-29 ENCOUNTER — Encounter (HOSPITAL_COMMUNITY): Payer: Self-pay | Admitting: Anesthesiology

## 2015-06-29 ENCOUNTER — Encounter (HOSPITAL_COMMUNITY): Payer: Self-pay | Admitting: Orthopedic Surgery

## 2015-06-29 ENCOUNTER — Inpatient Hospital Stay (HOSPITAL_COMMUNITY): Payer: BLUE CROSS/BLUE SHIELD

## 2015-06-29 LAB — BASIC METABOLIC PANEL
ANION GAP: 6 (ref 5–15)
BUN: <5 mg/dL — ABNORMAL LOW (ref 6–20)
CHLORIDE: 112 mmol/L — AB (ref 101–111)
CO2: 24 mmol/L (ref 22–32)
Calcium: 6.7 mg/dL — ABNORMAL LOW (ref 8.9–10.3)
Creatinine, Ser: 0.85 mg/dL (ref 0.44–1.00)
GFR calc non Af Amer: >60 mL/min (ref >60)
Glucose, Bld: 105 mg/dL — ABNORMAL HIGH (ref 65–99)
POTASSIUM: 3.7 mmol/L (ref 3.5–5.1)
SODIUM: 142 mmol/L (ref 135–145)

## 2015-06-29 LAB — PREPARE PLATELET PHERESIS: UNIT DIVISION: 0

## 2015-06-29 LAB — CBC WITH DIFFERENTIAL/PLATELET
BASOS PCT: 0 %
Basophils Absolute: 0 10*3/uL (ref 0.0–0.1)
EOS ABS: 0.2 10*3/uL (ref 0.0–0.7)
Eosinophils Relative: 1 %
HCT: 35.5 % — ABNORMAL LOW (ref 36.0–46.0)
HEMOGLOBIN: 12.3 g/dL (ref 12.0–15.0)
Lymphocytes Relative: 11 %
Lymphs Abs: 1.2 10*3/uL (ref 0.7–4.0)
MCH: 28.5 pg (ref 26.0–34.0)
MCHC: 34.6 g/dL (ref 30.0–36.0)
MCV: 82.4 fL (ref 78.0–100.0)
Monocytes Absolute: 0.8 10*3/uL (ref 0.1–1.0)
Monocytes Relative: 7 %
NEUTROS ABS: 9.4 10*3/uL — AB (ref 1.7–7.7)
NEUTROS PCT: 81 %
Platelets: 154 10*3/uL (ref 150–400)
RBC: 4.31 MIL/uL (ref 3.87–5.11)
RDW: 15.5 % (ref 11.5–15.5)
WBC: 11.5 10*3/uL — AB (ref 4.0–10.5)

## 2015-06-29 LAB — PREPARE CRYOPRECIPITATE: UNIT DIVISION: 0

## 2015-06-29 LAB — LACTIC ACID, PLASMA: Lactic Acid, Venous: 0.8 mmol/L (ref 0.5–2.0)

## 2015-06-29 LAB — BLOOD PRODUCT ORDER (VERBAL) VERIFICATION

## 2015-06-29 MED ORDER — SODIUM CHLORIDE 0.9% FLUSH
10.0000 mL | Freq: Two times a day (BID) | INTRAVENOUS | Status: DC
  Administered 2015-06-30: 20 mL
  Administered 2015-07-01 – 2015-07-04 (×5): 10 mL
  Administered 2015-07-04 – 2015-07-05 (×2): 40 mL
  Administered 2015-07-05 – 2015-07-13 (×10): 10 mL

## 2015-06-29 MED ORDER — IPRATROPIUM-ALBUTEROL 0.5-2.5 (3) MG/3ML IN SOLN
3.0000 mL | Freq: Four times a day (QID) | RESPIRATORY_TRACT | Status: DC
  Administered 2015-06-29 – 2015-07-02 (×13): 3 mL via RESPIRATORY_TRACT
  Filled 2015-06-29 (×12): qty 3

## 2015-06-29 MED ORDER — GENTAMICIN SULFATE 40 MG/ML IJ SOLN
520.0000 mg | Freq: Once | INTRAMUSCULAR | Status: AC
  Administered 2015-06-29: 520 mg via INTRAVENOUS
  Filled 2015-06-29: qty 13

## 2015-06-29 MED ORDER — CEFAZOLIN SODIUM-DEXTROSE 2-4 GM/100ML-% IV SOLN
2.0000 g | Freq: Three times a day (TID) | INTRAVENOUS | Status: DC
  Administered 2015-06-29 – 2015-07-03 (×12): 2 g via INTRAVENOUS
  Filled 2015-06-29 (×16): qty 100

## 2015-06-29 MED ORDER — SODIUM CHLORIDE 0.9% FLUSH
10.0000 mL | INTRAVENOUS | Status: DC | PRN
  Administered 2015-07-08 – 2015-07-13 (×4): 10 mL
  Administered 2015-07-14: 20 mL
  Filled 2015-06-29 (×5): qty 40

## 2015-06-29 NOTE — Progress Notes (Signed)
PT Cancellation Note  Patient Details Name: Margart SicklesJasmine Nola MRN: [id number] DOB: 1991-01-10   Cancelled Treatment:    Reason Eval/Treat Not Completed: Patient not medically ready (for OR today, will hold per nsg)   Fabio AsaWerner, Gavyn Ybarra J 06/29/2015, 7:38 AM Charlotte Crumbevon Karron Alvizo, PT DPT  [phone]

## 2015-06-29 NOTE — Progress Notes (Signed)
OT Cancellation Note  Patient Details Name: [name] MRN: [id number] DOB: 1990/09/01   Cancelled Treatment:    Reason Eval/Treat Not Completed: Patient not medically ready (Scheduled for surgery today.)  [facility],HILLARY  Lalah Durango, OTR/L  [phone] 06/29/2015 06/29/2015, 7:51 AM

## 2015-06-29 NOTE — Procedures (Signed)
Pt transported from 3M14 to CT then back to room without complications.

## 2015-06-29 NOTE — Progress Notes (Signed)
Pt was transported to CT with 2 RN assist, Rn and trasporter on bed wit vent , O2, & monitor. Patient was returned to her room without incident or injury. Family was updated at bedside

## 2015-06-29 NOTE — Anesthesia Preprocedure Evaluation (Deleted)
Anesthesia Evaluation    Airway       Dental   Pulmonary Current Smoker,          Cardiovascular     Neuro/Psych    GI/Hepatic   Endo/Other    Renal/GU      Musculoskeletal   Abdominal   Peds  Hematology   Anesthesia Other Findings   Reproductive/Obstetrics                           Anesthesia Physical Anesthesia Plan Anesthesia Quick Evaluation  

## 2015-06-29 NOTE — Progress Notes (Addendum)
Patient ID: [name], female   DOB: [birth year], 25 y.o.   MRN: [id number] Follow up - Trauma Critical Care  Patient Details:    [name] is an 25 y.o. female.  Lines/tubes : Airway 7.5 mm (Active)  Secured at (cm) 24 cm 06/29/2015  8:22 AM  Measured From Lips 06/29/2015  8:22 AM  Secured Location Center 06/29/2015  8:22 AM  Secured By [employer] 06/29/2015  8:22 AM  Tube Holder Repositioned Yes 06/29/2015  8:22 AM  Cuff Pressure (cm H2O) 30 cm H2O 06/28/2015  4:22 PM  Site Condition Dry 06/29/2015  8:22 AM     Arterial Line 06/28/15 Left Radial (Active)  Site Assessment Clean;Dry;Intact 06/29/2015  8:10 AM  Line Status Pulsatile blood flow 06/29/2015  8:10 AM  Art Line Waveform Appropriate 06/29/2015  8:10 AM  Art Line Interventions Zeroed and calibrated;Leveled;Connections checked and tightened;Flushed per protocol 06/29/2015  8:10 AM  Color/Movement/Sensation Capillary refill less than 3 sec 06/29/2015  8:10 AM  Dressing Type Gauze;Transparent;Occlusive 06/29/2015  8:10 AM  Dressing Status Clean;Dry;Intact 06/29/2015  8:10 AM  Dressing Change Due 07/05/15 06/29/2015  8:10 AM     Urethral Catheter Alma Downs, RN Latex;Straight-tip;Temperature probe 14 Fr. (Active)  Indication for Insertion or Continuance of Catheter Peri-operative use for selective surgical procedure;Unstable spinal/crush injuries 06/29/2015  8:00 AM  Site Assessment Clean;Intact;Dry 06/29/2015  8:00 AM  Catheter Maintenance Bag below level of bladder;Catheter secured;Drainage bag/tubing not touching floor 06/29/2015  8:00 AM  Collection Container Standard drainage bag 06/29/2015  8:00 AM  Securement Method Securing device (Describe) 06/29/2015  8:00 AM  Urinary Catheter Interventions Unclamped 06/29/2015  8:00 AM  Output (mL) 275 mL 06/29/2015  8:00 AM    Microbiology/Sepsis markers: Results for orders placed or performed during the hospital encounter of 06/28/15  MRSA PCR Screening     Status: None   Collection Time:  06/28/15 11:00 AM  Result Value Ref Range Status   MRSA by PCR NEGATIVE NEGATIVE Final    Comment:        The GeneXpert MRSA Assay (FDA approved for NASAL specimens only), is one component of a comprehensive MRSA colonization surveillance program. It is not intended to diagnose MRSA infection nor to guide or monitor treatment for MRSA infections.     Anti-infectives:  Anti-infectives    Start     Dose/Rate Route Frequency Ordered Stop   06/28/15 1200  ceFAZolin (ANCEF) IVPB 2g/100 mL premix  Status:  Discontinued     2 g 200 mL/hr over 30 Minutes Intravenous Every 6 hours 06/28/15 1155 06/28/15 1211   06/28/15 1200  ceFAZolin (ANCEF) IVPB 1 g/50 mL premix  Status:  Discontinued     1 g 100 mL/hr over 30 Minutes Intravenous Every 8 hours 06/28/15 1055 06/28/15 1211   06/28/15 1041  ceFAZolin (ANCEF) 2-4 GM/100ML-% IVPB    Comments:  Alanda Amass   : cabinet override      06/28/15 1041 06/28/15 2244   06/28/15 0430  ceFAZolin (ANCEF) IVPB 1 g/50 mL premix     1 g 100 mL/hr over 30 Minutes Intravenous  Once 06/28/15 0420 06/28/15 0520      Best Practice/Protocols:  VTE Prophylaxis: Mechanical Continous Sedation  Consults: Treatment Team:  Samson Frederic, MD Myrene Galas, MD Newman Pies, MD    Studies:CXR - Stable support apparatus. No pneumothorax. Again noted infiltrate or lung contusion right lung. Atelectasis or lung contusion in left base. Bilateral rib fractures. Metallic foreign body midline lower  mediastinal probable within distal esophagus.  Subjective:    Overnight Issues:  stable Objective:  Vital signs for last 24 hours: Temp:  [94.5 F (34.7 C)-99.9 F (37.7 C)] 99.7 F (37.6 C) (06/05 0900) Pulse Rate:  [86-111] 105 (06/05 0900) Resp:  [0-25] 21 (06/05 0900) BP: (88-120)/(61-101) 111/76 mmHg (06/05 0900) SpO2:  [96 %-100 %] 98 % (06/05 0900) Arterial Line BP: (96-138)/(54-89) 133/71 mmHg (06/05 0900) FiO2 (%):  [40 %-100 %] 40 % (06/05  0822) Weight:  [99.7 kg (219 lb 12.8 oz)] 99.7 kg (219 lb 12.8 oz) (06/04 1300)  Hemodynamic parameters for last 24 hours:    Intake/Output from previous day: 06/04 0701 - 06/05 0700 In: 12289.5 [I.V.:8571.5; ZOXWR:6045Blood:3668; IV Piggyback:50] Out: 4098 [JXBJY:[id number] [Urine:4585; Blood:750]  Intake/Output this shift: Total I/O In: 159.4 [I.V.:159.4] Out: 275 [Urine:275]  Vent settings for last 24 hours: Vent Mode:  [-] CPAP;PSV FiO2 (%):  [40 %-100 %] 40 % Set Rate:  [14 bmp-18 bmp] 18 bmp Vt Set:  [550 mL-600 mL] 550 mL PEEP:  [5 cmH20] 5 cmH20 Pressure Support:  [10 cmH20] 10 cmH20 Plateau Pressure:  [13 cmH20-20 cmH20] 13 cmH20  Physical Exam:  General: on vent Neuro: RN reports F/C during Wake-up assessment HEENT/Neck: ETT and collar Resp: clear to auscultation bilaterally CVS: RRR GI: soft, NT, ND  Results for orders placed or performed during the hospital encounter of 06/28/15 (from the past 24 hour(s))  I-STAT 7, (LYTES, BLD GAS, ICA, H+H)     Status: Abnormal   Collection Time: 06/28/15  9:34 AM  Result Value Ref Range   pH, Arterial 7.276 (L) 7.350 - 7.450   pCO2 arterial 31.6 (L) 35.0 - 45.0 mmHg   pO2, Arterial 88.0 80.0 - 100.0 mmHg   Bicarbonate 15.2 (L) 20.0 - 24.0 mEq/L   TCO2 16 0 - 100 mmol/L   O2 Saturation 97.0 %   Acid-base deficit 11.0 (H) 0.0 - 2.0 mmol/L   Sodium 145 135 - 145 mmol/L   Potassium 3.9 3.5 - 5.1 mmol/L   Calcium, Ion 0.80 (L) 1.12 - 1.23 mmol/L   HCT 35.0 (L) 36.0 - 46.0 %   Hemoglobin 11.9 (L) 12.0 - 15.0 g/dL   Patient temperature 56.234.2 C    Sample type ARTERIAL   Blood gas, arterial     Status: Abnormal   Collection Time: 06/28/15 10:53 AM  Result Value Ref Range   FIO2 1.00    Delivery systems VENTILATOR    Mode PRESSURE REGULATED VOLUME CONTROL    VT 600 mL   LHR 14 resp/min   Peep/cpap 5.0 cm H20   pH, Arterial 7.232 (L) 7.350 - 7.450   pCO2 arterial 45.8 (H) 35.0 - 45.0 mmHg   pO2, Arterial 91.5 80.0 - 100.0 mmHg   Bicarbonate 18.6  (L) 20.0 - 24.0 mEq/L   TCO2 20.0 0 - 100 mmol/L   Acid-base deficit 7.6 (H) 0.0 - 2.0 mmol/L   O2 Saturation 95.8 %   Patient temperature 98.6    Collection site A-LINE    Drawn by [phone]    Sample type ARTERIAL DRAW    Allens test (pass/fail) PASS PASS  MRSA PCR Screening     Status: None   Collection Time: 06/28/15 11:00 AM  Result Value Ref Range   MRSA by PCR NEGATIVE NEGATIVE  Prepare cryoprecipitate     Status: None   Collection Time: 06/28/15 11:41 AM  Result Value Ref Range   Unit Number [id number]    Blood  Component Type CRYPOOL THAW    Unit division 00    Status of Unit ISSUED,FINAL    Transfusion Status OK TO TRANSFUSE   Triglycerides     Status: None   Collection Time: 06/28/15  3:45 PM  Result Value Ref Range   Triglycerides 145 <150 mg/dL  CBC with Differential/Platelet     Status: Abnormal   Collection Time: 06/28/15  3:45 PM  Result Value Ref Range   WBC 12.8 (H) 4.0 - 10.5 K/uL   RBC 4.49 3.87 - 5.11 MIL/uL   Hemoglobin 12.9 12.0 - 15.0 g/dL   HCT 16.1 09.6 - 04.5 %   MCV 82.4 78.0 - 100.0 fL   MCH 28.7 26.0 - 34.0 pg   MCHC 34.9 30.0 - 36.0 g/dL   RDW 40.9 81.1 - 91.4 %   Platelets 167 150 - 400 K/uL   Neutrophils Relative % 88 %   Neutro Abs 11.1 (H) 1.7 - 7.7 K/uL   Lymphocytes Relative 7 %   Lymphs Abs 0.9 0.7 - 4.0 K/uL   Monocytes Relative 5 %   Monocytes Absolute 0.7 0.1 - 1.0 K/uL   Eosinophils Relative 0 %   Eosinophils Absolute 0.0 0.0 - 0.7 K/uL   Basophils Relative 0 %   Basophils Absolute 0.0 0.0 - 0.1 K/uL  Basic metabolic panel     Status: Abnormal   Collection Time: 06/28/15  3:45 PM  Result Value Ref Range   Sodium 143 135 - 145 mmol/L   Potassium 3.9 3.5 - 5.1 mmol/L   Chloride 115 (H) 101 - 111 mmol/L   CO2 23 22 - 32 mmol/L   Glucose, Bld 98 65 - 99 mg/dL   BUN 7 6 - 20 mg/dL   Creatinine, Ser 7.82 0.44 - 1.00 mg/dL   Calcium 6.7 (L) 8.9 - 10.3 mg/dL   GFR calc non Af Amer >60 >60 mL/min   GFR calc Af Amer >60 >60  mL/min   Anion gap 5 5 - 15  Protime-INR     Status: Abnormal   Collection Time: 06/28/15  3:45 PM  Result Value Ref Range   Prothrombin Time 17.1 (H) 11.6 - 15.2 seconds   INR 1.38 0.00 - 1.49  Blood gas, arterial     Status: Abnormal   Collection Time: 06/28/15  4:32 PM  Result Value Ref Range   FIO2 0.80    Delivery systems VENTILATOR    Mode PRESSURE REGULATED VOLUME CONTROL    VT 550 mL   LHR 18 resp/min   Peep/cpap 5.0 cm H20   pH, Arterial 7.354 7.350 - 7.450   pCO2 arterial 42.1 35.0 - 45.0 mmHg   pO2, Arterial 127 (H) 80.0 - 100.0 mmHg   Bicarbonate 22.9 20.0 - 24.0 mEq/L   TCO2 24.2 0 - 100 mmol/L   Acid-base deficit 1.9 0.0 - 2.0 mmol/L   O2 Saturation 98.6 %   Patient temperature 98.6    Collection site A-LINE    Drawn by [id number]    Sample type ARTERIAL DRAW    Allens test (pass/fail) PASS PASS  CBC with Differential/Platelet     Status: Abnormal   Collection Time: 06/29/15  5:08 AM  Result Value Ref Range   WBC 11.5 (H) 4.0 - 10.5 K/uL   RBC 4.31 3.87 - 5.11 MIL/uL   Hemoglobin 12.3 12.0 - 15.0 g/dL   HCT 08.6 (L) 57.8 - 46.9 %   MCV 82.4 78.0 - 100.0 fL   MCH 28.5  26.0 - 34.0 pg   MCHC 34.6 30.0 - 36.0 g/dL   RDW 16.1 09.6 - 04.5 %   Platelets 154 150 - 400 K/uL   Neutrophils Relative % 81 %   Neutro Abs 9.4 (H) 1.7 - 7.7 K/uL   Lymphocytes Relative 11 %   Lymphs Abs 1.2 0.7 - 4.0 K/uL   Monocytes Relative 7 %   Monocytes Absolute 0.8 0.1 - 1.0 K/uL   Eosinophils Relative 1 %   Eosinophils Absolute 0.2 0.0 - 0.7 K/uL   Basophils Relative 0 %   Basophils Absolute 0.0 0.0 - 0.1 K/uL  Basic metabolic panel     Status: Abnormal   Collection Time: 06/29/15  5:08 AM  Result Value Ref Range   Sodium 142 135 - 145 mmol/L   Potassium 3.7 3.5 - 5.1 mmol/L   Chloride 112 (H) 101 - 111 mmol/L   CO2 24 22 - 32 mmol/L   Glucose, Bld 105 (H) 65 - 99 mg/dL   BUN <5 (L) 6 - 20 mg/dL   Creatinine, Ser 4.09 0.44 - 1.00 mg/dL   Calcium 6.7 (L) 8.9 - 10.3 mg/dL    GFR calc non Af Amer >60 >60 mL/min   GFR calc Af Amer >60 >60 mL/min   Anion gap 6 5 - 15    Assessment & Plan: Present on Admission:  . Pelvic fracture (HCC)   LOS: 1 day   Additional comments:I reviewed the patient's new clinical lab test results. and CXR PHBC R 1,2, 4-10 and L 1,3,5 rib FX and pulm contusions/B occult PTX - no PTX on CXR this AM, see below Vent dependent resp failure - wean but will not extubate as further OR pending, schedule BDs Pelvic FX with R obturator ring and L acetabulum and sacral ala with SI diastasis - Dr. Carola Frost to evaluate L tibial plateau and fibula FX - S/P spanning ex fix by Dr. Linna Caprice, Dr. Carola Frost to evaluate C7 TVP FX - Dr. Wynetta Emery to evaluate, collar for now Nasal FX - per Dr. Suszanne Conners L2 TVP FX Foreign body in esophagus - tongue ring connector, appears now in distal esophagus. Should pass. FEN - TF if not going to OR VTE - consider Lovenox pending OR plan DIspo - ICU  Critical Care Total Time*: [address] Minutes  [name], MD, MPH, FACS Trauma: [phone] General Surgery: [phone]  06/29/2015  *Care during the described time interval was provided by me. I have reviewed this patient's available data, including medical history, events of note, physical examination and test results as part of my evaluation.

## 2015-06-29 NOTE — Progress Notes (Signed)
Orthopedic Tech Progress Note Patient Details:  [name] 05-14-1990 [id number]  Musculoskeletal Traction Type of Traction: Skeletal (Balanced Suspension) Traction Location: LLE Traction Weight: 30 lbs    Jennye MoccasinHughes, Jovany Disano Craig 06/29/2015, 9:10 PM

## 2015-06-29 NOTE — Progress Notes (Signed)
Patient ID: [name], female   DOB: [birth year], 25 y.o.   MRN: [id number] I spoke with her parents at the bedside. Her mother will be taking leave from work to care [facility] after D/C. [name] Head, MD, MPH, FACS Trauma: [phone] General Surgery: [phone]
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
## 2015-06-29 NOTE — Progress Notes (Addendum)
Subjective: Pt is intubated and sedated.  Objective: Vital signs in last 24 hours: Temp:  [94.5 F (34.7 C)-99.9 F (37.7 C)] 99.9 F (37.7 C) (06/05 0700) Pulse Rate:  [86-105] 104 (06/05 0700) Resp:  [0-25] 5 (06/05 0700) BP: (88-120)/(61-101) 102/76 mmHg (06/05 0700) SpO2:  [96 %-100 %] 96 % (06/05 0700) Arterial Line BP: (96-138)/(54-89) 123/67 mmHg (06/05 0700) FiO2 (%):  [60 %-100 %] 60 % (06/05 0600) Weight:  [99.7 kg (219 lb 12.8 oz)] 99.7 kg (219 lb 12.8 oz) (06/04 1300)  Physical Exam  General: Intubated, sedated Head: Normocephalic.  Nasal splint in place. Ears: Normal auricles and EACs. Mouth: ET tube in place. No intraoral lesion or laceration. Multiple extremity abrasions Neurological: Sedated, nonresponsive.   Recent Labs  06/28/15 1545 06/29/15 0508  WBC 12.8* 11.5*  HGB 12.9 12.3  HCT 37.0 35.5*  PLT 167 154    Recent Labs  06/28/15 1545 06/29/15 0508  NA 143 142  K 3.9 3.7  CL 115* 112*  CO2 23 24  GLUCOSE 98 105*  BUN 7 <5*  CREATININE 0.85 0.85  CALCIUM 6.7* 6.7*    Medications:  I have reviewed the patient's current medications. Scheduled: . sodium chloride   Intravenous Once  . antiseptic oral rinse  7 mL Mouth Rinse QID  . chlorhexidine gluconate (SAGE KIT)  15 mL Mouth Rinse BID  . pantoprazole  40 mg Oral BID   Or  . famotidine (PEPCID) IV  20 mg Intravenous BID  . fentaNYL (SUBLIMAZE) injection  50 mcg Intravenous Once  . pneumococcal 23 valent vaccine  0.5 mL Intramuscular Tomorrow-1000   JRP:[id number] **OR** acetaminophen, bisacodyl, fentaNYL, menthol-cetylpyridinium **OR** phenol, metoCLOPramide **OR** metoCLOPramide (REGLAN) injection, midazolam, ondansetron **OR** ondansetron (ZOFRAN) IV, sennosides  Assessment/Plan: POD #1 s/p closed reduction of nasal septal fractures. Her metallic esophageal foreign body is now at the level of the Diaphragm. No other ENT intervention at this time. Will remove nasal splint later  this week.   LOS: 1 day   Disha Cottam,SUI W 06/29/2015, 7:36 AM
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
## 2015-06-29 NOTE — Progress Notes (Signed)
Peripherally Inserted Central Catheter/Midline Placement  The IV Nurse has discussed with the patient and/or persons authorized to consent for the patient, the purpose of this procedure and the potential benefits and risks involved with this procedure.  The benefits include less needle sticks, lab draws from the catheter and patient may be discharged home with the catheter.  Risks include, but not limited to, infection, bleeding, blood clot (thrombus formation), and puncture of an artery; nerve damage and irregular heat beat.  Alternatives to this procedure were also discussed.  PICC/Midline Placement Documentation        Lisabeth DevoidGibbs, Macie Baum Jeanette 06/29/2015, 2:08 PM Consent obtained by Stacie Glazeobin Joyce, RN from family at bedside, father.

## 2015-06-29 NOTE — Anesthesia Pain Management Evaluation Note (Addendum)
  Anesthesia Pain Consult Note  Patient: [name], 25 y.o., female  Consult Requested by: Trauma Md, MD  Reason for Consult: bilateral rib fractures after trauma  Other injuries: C7 TVP fx, R1,2,4-10 and L 1,3,5 rib fx, L2 TVP fx, pelvic fx, L tibial plateau and fibula fx, L femur fx, L ankle fx  Level of Consciousness: sedated however awake and follows commands per nursing reports when sedation weaned  Pain: reported as severe   Last Vitals:  Filed Vitals:   06/29/15 1900 06/29/15 2000  BP: 104/61 95/58  Pulse: 110 108  Temp: 38 C 38 C  Resp: 0 10    No Known Allergies  Physical exam: PULM expiratory rhonchi, bilateral breath sounds, intubated  CARDIO Rhythm: regular  OTHER Left sided LE fractures in fixation    I have reviewed the patient's medications listed below. . sodium chloride   Intravenous Once  . antiseptic oral rinse  7 mL Mouth Rinse QID  .  ceFAZolin (ANCEF) IV  2 g Intravenous Q8H  . chlorhexidine gluconate (SAGE KIT)  15 mL Mouth Rinse BID  . pantoprazole  40 mg Oral BID   Or  . famotidine (PEPCID) IV  20 mg Intravenous BID  . fentaNYL (SUBLIMAZE) injection  50 mcg Intravenous Once  . ipratropium-albuterol  3 mL Nebulization Q6H  . pneumococcal 23 valent vaccine  0.5 mL Intramuscular Tomorrow-1000  . sodium chloride flush  10-40 mL Intracatheter Q12H   . dextrose 5 % and 0.45 % NaCl with KCl 20 mEq/L 125 mL/hr at 06/29/15 2000  . fentaNYL infusion INTRAVENOUS 75 mcg/hr (06/29/15 2000)  . propofol (DIPRIVAN) infusion 50 mcg/kg/min (06/29/15 2112)   acetaminophen **OR** acetaminophen, bisacodyl, fentaNYL, menthol-cetylpyridinium **OR** phenol, metoCLOPramide **OR** metoCLOPramide (REGLAN) injection, midazolam, ondansetron **OR** ondansetron (ZOFRAN) IV, sennosides, sodium chloride flush  History reviewed. No pertinent past medical history. Past Surgical History  Procedure Laterality Date  . I&d extremity Left 06/28/2015    Procedure: IRRIGATION  AND DEBRIDEMENT EXTREMITY;  Surgeon: Rod Can, MD;  Location: [place];  Service: Orthopedics;  Laterality: Left;  . External fixation leg Left 06/28/2015    Procedure: EXTERNAL FIXATION LEG;  Surgeon: Rod Can, MD;  Location: [place];  Service: Orthopedics;  Laterality: Left;  . Closed reduction nasal fracture N/A 06/28/2015    Procedure: CLOSED REDUCTION NASoSepto FRACTURE;  Surgeon: Leta Baptist, MD;  Location: [place] OR;  Service: ENT;  Laterality: N/A;  . Laryngoscopy and bronchoscopy N/A 06/28/2015    Procedure: LARYNGOSCOPY AND BRONCHOSCOPY;  Surgeon: Leta Baptist, MD;  Location: [facility] OR;  Service: ENT;  Laterality: N/A;  . Esophagoscopy N/A 06/28/2015    Procedure: ESOPHAGOSCOPY;  Surgeon: Leta Baptist, MD;  Location: [place];  Service: ENT;  Laterality: N/A;    reports that she has been smoking.  She does not have any smokeless tobacco history on file. Her alcohol and drug histories are not on file.    Plan: Patient scheduled for surgery with Dr Marcelino Scot on 6/6. Will evaluate patient during surgical procedure for epidural placement to help manage pain secondary to extensive bilateral rib fractures. Will obtain an ABG to determine phase of pulmonary contusions and discuss long term orthopedic plan to detirmine time frame for potential extubation.     [name] 06/29/2015

## 2015-06-30 ENCOUNTER — Inpatient Hospital Stay (HOSPITAL_COMMUNITY): Payer: BLUE CROSS/BLUE SHIELD | Admitting: Anesthesiology

## 2015-06-30 ENCOUNTER — Encounter (HOSPITAL_COMMUNITY): Admission: EM | Disposition: A | Discharge status: Rehabilitation Facility | Payer: Self-pay | Source: Home / Self Care

## 2015-06-30 ENCOUNTER — Inpatient Hospital Stay (HOSPITAL_COMMUNITY): Payer: BLUE CROSS/BLUE SHIELD

## 2015-06-30 ENCOUNTER — Encounter (HOSPITAL_COMMUNITY): Payer: Self-pay | Admitting: Certified Registered"

## 2015-06-30 HISTORY — PX: IRRIGATION AND DEBRIDEMENT FOOT: SHX6602

## 2015-06-30 HISTORY — PX: ORIF ANKLE FRACTURE: SHX5408

## 2015-06-30 HISTORY — PX: ORIF PELVIC FRACTURE: SHX2128

## 2015-06-30 HISTORY — PX: APPLICATION OF WOUND VAC: SHX5189

## 2015-06-30 HISTORY — PX: I & D EXTREMITY: SHX5045

## 2015-06-30 LAB — PREPARE FRESH FROZEN PLASMA
UNIT DIVISION: 0
UNIT DIVISION: 0
UNIT DIVISION: 0
UNIT DIVISION: 0
UNIT DIVISION: 0
UNIT DIVISION: 0
UNIT DIVISION: 0
UNIT DIVISION: 0
UNIT DIVISION: 0
Unit division: 0
Unit division: 0
Unit division: 0
Unit division: 0
Unit division: 0

## 2015-06-30 LAB — CBC
HCT: 34.9 % — ABNORMAL LOW (ref 36.0–46.0)
HEMATOCRIT: 35.7 % — AB (ref 36.0–46.0)
HEMOGLOBIN: 11.9 g/dL — AB (ref 12.0–15.0)
Hemoglobin: 11.7 g/dL — ABNORMAL LOW (ref 12.0–15.0)
MCH: 28.4 pg (ref 26.0–34.0)
MCH: 28.9 pg (ref 26.0–34.0)
MCHC: 33.3 g/dL (ref 30.0–36.0)
MCHC: 33.5 g/dL (ref 30.0–36.0)
MCV: 85.2 fL (ref 78.0–100.0)
MCV: 86.2 fL (ref 78.0–100.0)
PLATELETS: 120 10*3/uL — AB (ref 150–400)
Platelets: 131 10*3/uL — ABNORMAL LOW (ref 150–400)
RBC: 4.19 MIL/uL (ref 3.87–5.11)
RBC: 4.05 MIL/uL (ref 3.87–5.11)
RDW: 16.6 % — AB (ref 11.5–15.5)
RDW: 17 % — AB (ref 11.5–15.5)
WBC: 13.2 10*3/uL — AB (ref 4.0–10.5)
WBC: 15 10*3/uL — ABNORMAL HIGH (ref 4.0–10.5)

## 2015-06-30 LAB — POCT I-STAT 7, (LYTES, BLD GAS, ICA,H+H)
ACID-BASE DEFICIT: 5 mmol/L — AB (ref 0.0–2.0)
Bicarbonate: 20.8 meq/L (ref 20.0–24.0)
Calcium, Ion: 1.1 mmol/L — ABNORMAL LOW (ref 1.12–1.23)
HCT: 32 % — ABNORMAL LOW (ref 36.0–46.0)
Hemoglobin: 10.9 g/dL — ABNORMAL LOW (ref 12.0–15.0)
O2 SAT: 99 %
PCO2 ART: 39.6 mmHg (ref 35.0–45.0)
PH ART: 7.32 — AB (ref 7.350–7.450)
PO2 ART: 141 mmHg — AB (ref 80.0–100.0)
POTASSIUM: 4.5 mmol/L (ref 3.5–5.1)
Patient temperature: 35.3
Sodium: 142 mmol/L (ref 135–145)
TCO2: 22 mmol/L (ref 0–100)

## 2015-06-30 LAB — BASIC METABOLIC PANEL
ANION GAP: 8 (ref 5–15)
BUN: <5 mg/dL — ABNORMAL LOW (ref 6–20)
CHLORIDE: 111 mmol/L (ref 101–111)
CO2: 22 mmol/L (ref 22–32)
Calcium: 7.9 mg/dL — ABNORMAL LOW (ref 8.9–10.3)
Creatinine, Ser: 0.84 mg/dL (ref 0.44–1.00)
GFR calc non Af Amer: >60 mL/min (ref >60)
GLUCOSE: 110 mg/dL — AB (ref 65–99)
POTASSIUM: 3.9 mmol/L (ref 3.5–5.1)
Sodium: 141 mmol/L (ref 135–145)

## 2015-06-30 LAB — BLOOD PRODUCT ORDER (VERBAL) VERIFICATION

## 2015-06-30 LAB — LACTIC ACID, PLASMA: Lactic Acid, Venous: 1.3 mmol/L (ref 0.5–2.0)

## 2015-06-30 LAB — GENTAMICIN LEVEL, RANDOM: Gentamicin Rm: 2.8 ug/mL

## 2015-06-30 SURGERY — IRRIGATION AND DEBRIDEMENT EXTREMITY
Anesthesia: General | Site: Leg Lower | Laterality: Left

## 2015-06-30 MED ORDER — MIDAZOLAM HCL 5 MG/5ML IJ SOLN
INTRAMUSCULAR | Status: DC | PRN
  Administered 2015-06-30 (×2): 2 mg via INTRAVENOUS

## 2015-06-30 MED ORDER — PROPOFOL 10 MG/ML IV BOLUS
INTRAVENOUS | Status: AC
  Filled 2015-06-30: qty 20

## 2015-06-30 MED ORDER — SODIUM CHLORIDE 0.9 % IR SOLN
Status: DC | PRN
  Administered 2015-06-30: 1000 mL

## 2015-06-30 MED ORDER — SODIUM CHLORIDE 0.9 % IR SOLN
Status: DC | PRN
  Administered 2015-06-30 (×3): 3000 mL

## 2015-06-30 MED ORDER — CEFAZOLIN SODIUM 1 G IJ SOLR
INTRAMUSCULAR | Status: AC
Start: 2015-06-30 — End: 2015-06-30
  Filled 2015-06-30: qty 20

## 2015-06-30 MED ORDER — MIDAZOLAM HCL 2 MG/2ML IJ SOLN
INTRAMUSCULAR | Status: AC
  Filled 2015-06-30: qty 2

## 2015-06-30 MED ORDER — ROCURONIUM BROMIDE 50 MG/5ML IV SOLN
INTRAVENOUS | Status: AC
Start: 2015-06-30 — End: 2015-06-30
  Filled 2015-06-30: qty 2

## 2015-06-30 MED ORDER — ROCURONIUM BROMIDE 50 MG/5ML IV SOLN
INTRAVENOUS | Status: AC
  Filled 2015-06-30: qty 1

## 2015-06-30 MED ORDER — MIDAZOLAM HCL 2 MG/2ML IJ SOLN
INTRAMUSCULAR | Status: AC
Start: 2015-06-30 — End: 2015-06-30
  Filled 2015-06-30: qty 2

## 2015-06-30 MED ORDER — DEXTROSE 5 % IV SOLN
520.0000 mg | INTRAVENOUS | Status: DC
  Filled 2015-06-30: qty 13

## 2015-06-30 MED ORDER — GENTAMICIN SULFATE 40 MG/ML IJ SOLN
INTRAMUSCULAR | Status: AC
Start: 2015-06-30 — End: 2015-06-30
  Filled 2015-06-30: qty 6

## 2015-06-30 MED ORDER — ROCURONIUM BROMIDE 100 MG/10ML IV SOLN
INTRAVENOUS | Status: DC | PRN
  Administered 2015-06-30 (×4): 50 mg via INTRAVENOUS

## 2015-06-30 MED ORDER — VANCOMYCIN HCL 1000 MG IV SOLR
INTRAVENOUS | Status: AC
Start: 2015-06-30 — End: 2015-06-30
  Filled 2015-06-30: qty 1000

## 2015-06-30 MED ORDER — ALBUMIN HUMAN 5 % IV SOLN
INTRAVENOUS | Status: DC | PRN
  Administered 2015-06-30: 09:00:00 via INTRAVENOUS

## 2015-06-30 MED ORDER — FENTANYL CITRATE (PF) 250 MCG/5ML IJ SOLN
INTRAMUSCULAR | Status: AC
  Filled 2015-06-30: qty 5

## 2015-06-30 MED ORDER — GENTAMICIN SULFATE 40 MG/ML IJ SOLN
INTRAMUSCULAR | Status: DC | PRN
  Administered 2015-06-30: 240 mg

## 2015-06-30 MED ORDER — FENTANYL CITRATE (PF) 100 MCG/2ML IJ SOLN
INTRAMUSCULAR | Status: DC | PRN
  Administered 2015-06-30: 50 ug via INTRAVENOUS
  Administered 2015-06-30 (×2): 100 ug via INTRAVENOUS

## 2015-06-30 MED ORDER — GENTAMICIN SULFATE 40 MG/ML IJ SOLN
520.0000 mg | INTRAMUSCULAR | Status: DC
  Administered 2015-06-30 – 2015-07-05 (×5): 520 mg via INTRAVENOUS
  Filled 2015-06-30 (×5): qty 13

## 2015-06-30 MED ORDER — VANCOMYCIN HCL 1000 MG IV SOLR
INTRAVENOUS | Status: DC | PRN
  Administered 2015-06-30: 1000 mg

## 2015-06-30 MED ORDER — SODIUM CHLORIDE 0.9 % IJ SOLN
INTRAMUSCULAR | Status: AC
Start: 2015-06-30 — End: 2015-06-30
  Filled 2015-06-30: qty 10

## 2015-06-30 SURGICAL SUPPLY — 108 items
BANDAGE ACE 4X5 VEL STRL LF (GAUZE/BANDAGES/DRESSINGS) ×6 IMPLANT
BANDAGE ELASTIC 4 VELCRO ST LF (GAUZE/BANDAGES/DRESSINGS) ×6 IMPLANT
BANDAGE ELASTIC 6 VELCRO ST LF (GAUZE/BANDAGES/DRESSINGS) ×12 IMPLANT
BIT DRILL 2 QR W/DEPTH MARKS (BIT) ×6
BIT DRILL 2MM QR W/DEPTH MARKS (BIT) ×4 IMPLANT
BLADE SURG 10 STRL SS (BLADE) ×12 IMPLANT
BLADE SURG ROTATE 9660 (MISCELLANEOUS) IMPLANT
BNDG COHESIVE 4X5 TAN STRL (GAUZE/BANDAGES/DRESSINGS) ×6 IMPLANT
BNDG GAUZE ELAST 4 BULKY (GAUZE/BANDAGES/DRESSINGS) ×18 IMPLANT
BNDG GAUZE STRTCH 6 (GAUZE/BANDAGES/DRESSINGS) IMPLANT
BONE CEMENT PALACOSE (Orthopedic Implant) ×12 IMPLANT
BRUSH SCRUB DISP (MISCELLANEOUS) ×12 IMPLANT
CEMENT BONE PALACOSE (Orthopedic Implant) ×8 IMPLANT
COLLECTOR WOUND DRAINAGE MED (WOUND CARE) ×12 IMPLANT
COVER SURGICAL LIGHT HANDLE (MISCELLANEOUS) ×6 IMPLANT
DRAIN CHANNEL 15F RND FF W/TCR (WOUND CARE) IMPLANT
DRAPE C-ARM 42X72 X-RAY (DRAPES) ×12 IMPLANT
DRAPE C-ARMOR (DRAPES) ×6 IMPLANT
DRAPE IMP U-DRAPE 54X76 (DRAPES) ×6 IMPLANT
DRAPE INCISE IOBAN 66X45 STRL (DRAPES) ×6 IMPLANT
DRAPE LAPAROTOMY TRNSV 102X78 (DRAPE) ×6 IMPLANT
DRAPE U-SHAPE 47X51 STRL (DRAPES) ×6 IMPLANT
DRSG ADAPTIC 3X8 NADH LF (GAUZE/BANDAGES/DRESSINGS) IMPLANT
DRSG MEPILEX BORDER 4X4 (GAUZE/BANDAGES/DRESSINGS) ×6 IMPLANT
DRSG MEPITEL 4X7.2 (GAUZE/BANDAGES/DRESSINGS) ×6 IMPLANT
DRSG PAD ABDOMINAL 8X10 ST (GAUZE/BANDAGES/DRESSINGS) IMPLANT
DRSG VAC ATS MED SENSATRAC (GAUZE/BANDAGES/DRESSINGS) ×6 IMPLANT
ELECT CAUTERY BLADE 6.4 (BLADE) IMPLANT
ELECT REM PT RETURN 9FT ADLT (ELECTROSURGICAL) ×6
ELECTRODE REM PT RTRN 9FT ADLT (ELECTROSURGICAL) ×4 IMPLANT
EVACUATOR SILICONE 100CC (DRAIN) IMPLANT
GAUZE SPONGE 4X4 12PLY STRL (GAUZE/BANDAGES/DRESSINGS) IMPLANT
GLOVE BIO SURGEON STRL SZ7.5 (GLOVE) ×12 IMPLANT
GLOVE BIO SURGEON STRL SZ8 (GLOVE) ×12 IMPLANT
GLOVE BIOGEL PI IND STRL 7.5 (GLOVE) ×4 IMPLANT
GLOVE BIOGEL PI IND STRL 8 (GLOVE) ×8 IMPLANT
GLOVE BIOGEL PI INDICATOR 7.5 (GLOVE) ×2
GLOVE BIOGEL PI INDICATOR 8 (GLOVE) ×4
GLOVE ECLIPSE 6.5 STRL STRAW (GLOVE) ×12 IMPLANT
GOWN STRL REUS W/ TWL LRG LVL3 (GOWN DISPOSABLE) ×20 IMPLANT
GOWN STRL REUS W/ TWL XL LVL3 (GOWN DISPOSABLE) ×8 IMPLANT
GOWN STRL REUS W/TWL LRG LVL3 (GOWN DISPOSABLE) ×10
GOWN STRL REUS W/TWL XL LVL3 (GOWN DISPOSABLE) ×4
GUIDEPIN 2.8X300 THRD TIP OIC (Screw) ×12 IMPLANT
HANDPIECE INTERPULSE COAX TIP (DISPOSABLE) ×2
KIT BASIN OR (CUSTOM PROCEDURE TRAY) ×6 IMPLANT
KIT PREVENA INCISION MGT 13 (CANNISTER) ×6 IMPLANT
KIT ROOM TURNOVER OR (KITS) ×6 IMPLANT
MANIFOLD NEPTUNE II (INSTRUMENTS) ×6 IMPLANT
NEEDLE 18GX1X1/2 (RX/OR ONLY) (NEEDLE) ×6 IMPLANT
NS IRRIG 1000ML POUR BTL (IV SOLUTION) ×12 IMPLANT
PACK ORTHO EXTREMITY (CUSTOM PROCEDURE TRAY) ×6 IMPLANT
PACK TOTAL JOINT (CUSTOM PROCEDURE TRAY) IMPLANT
PACK UNIVERSAL I (CUSTOM PROCEDURE TRAY) ×6 IMPLANT
PAD ARMBOARD 7.5X6 YLW CONV (MISCELLANEOUS) ×12 IMPLANT
PAD NEG PRESSURE SENSATRAC (MISCELLANEOUS) ×6 IMPLANT
PADDING CAST ABS 4INX4YD NS (CAST SUPPLIES) ×2
PADDING CAST ABS 6INX4YD NS (CAST SUPPLIES) ×4
PADDING CAST ABS COTTON 4X4 ST (CAST SUPPLIES) ×4 IMPLANT
PADDING CAST ABS COTTON 6X4 NS (CAST SUPPLIES) ×8 IMPLANT
PADDING CAST COTTON 6X4 STRL (CAST SUPPLIES) IMPLANT
PEG CORT LOCK 2.3X36MM (Peg) ×8 IMPLANT
PEG CORTICAL 2.3MMX36MM (Peg) ×4 IMPLANT
PIN TROCAR POINT 2MM-1 (PIN) ×6 IMPLANT
PLATE PEG HOOK LOCK 3H 59MM (Plate) ×4 IMPLANT
PLATE PEG HOOK LOCKING 3HOLE (Plate) ×2 IMPLANT
PREVENA INCISION MGT 90 150 (MISCELLANEOUS) ×6 IMPLANT
RING TRICUSPID OVAL (Prosthesis & Implant Heart) ×6 IMPLANT
SCREW BONE NL 2.7X34MM HEXA (Screw) ×4 IMPLANT
SCREW CANN 7.3X120 32MM THRD (Screw) ×6 IMPLANT
SCREW CANN 7.3X150 32MM THRD (Screw) ×6 IMPLANT
SCREW CANNULATED 7.3X160MM (Screw) ×6 IMPLANT
SCREW CORTICAL HEXALOBE 2.7X28 (Screw) ×6 IMPLANT
SCREW NL 3.5X10 (Screw) ×6 IMPLANT
SCREW NLOCK 40X2.7 (Screw) ×4 IMPLANT
SCREW NONLOCK 2.7X40MM (Screw) ×2 IMPLANT
SCREW VARIABLE 2.7X30MM (Screw) ×6 IMPLANT
SET HNDPC FAN SPRY TIP SCT (DISPOSABLE) ×4 IMPLANT
SPLINT FIBERGLASS 4X30 (CAST SUPPLIES) ×6 IMPLANT
SPONGE GAUZE 4X4 12PLY STER LF (GAUZE/BANDAGES/DRESSINGS) ×12 IMPLANT
SPONGE LAP 18X18 X RAY DECT (DISPOSABLE) ×18 IMPLANT
STAPLER VISISTAT 35W (STAPLE) ×6 IMPLANT
STOCKINETTE IMPERVIOUS 9X36 MD (GAUZE/BANDAGES/DRESSINGS) ×6 IMPLANT
SUCTION FRAZIER HANDLE 10FR (MISCELLANEOUS) ×4
SUCTION TUBE FRAZIER 10FR DISP (MISCELLANEOUS) ×8 IMPLANT
SUT ETHILON 2 0 FS 18 (SUTURE) ×12 IMPLANT
SUT ETHILON 3 0 PS 1 (SUTURE) ×24 IMPLANT
SUT PDS AB 0 CT 36 (SUTURE) ×18 IMPLANT
SUT PDS AB 2-0 CT1 27 (SUTURE) ×18 IMPLANT
SUT VIC AB 0 CT1 27 (SUTURE)
SUT VIC AB 0 CT1 27XBRD ANBCTR (SUTURE) IMPLANT
SUT VIC AB 1 CT1 18XCR BRD 8 (SUTURE) IMPLANT
SUT VIC AB 1 CT1 8-18 (SUTURE)
SUT VIC AB 2-0 CT1 27 (SUTURE)
SUT VIC AB 2-0 CT1 TAPERPNT 27 (SUTURE) IMPLANT
SWAB COLLECTION DEVICE MRSA (MISCELLANEOUS) ×6 IMPLANT
SYR CONTROL 10ML LL (SYRINGE) ×6 IMPLANT
TAPE CLOTH SURG 4X10 WHT LF (GAUZE/BANDAGES/DRESSINGS) ×6 IMPLANT
TOWEL OR 17X24 6PK STRL BLUE (TOWEL DISPOSABLE) ×6 IMPLANT
TOWEL OR 17X26 10 PK STRL BLUE (TOWEL DISPOSABLE) ×12 IMPLANT
TRAY FOLEY CATH 16FRSI W/METER (SET/KITS/TRAYS/PACK) IMPLANT
TUBE ANAEROBIC SPECIMEN COL (MISCELLANEOUS) ×6 IMPLANT
TUBE CONNECTING 12'X1/4 (SUCTIONS) ×2
TUBE CONNECTING 12X1/4 (SUCTIONS) ×10 IMPLANT
UNDERPAD 30X30 INCONTINENT (UNDERPADS AND DIAPERS) ×6 IMPLANT
WASHER OIC 13MM 6 PACK (Screw) ×18 IMPLANT
WIRE K 1.6MM 144256 (MISCELLANEOUS) ×12 IMPLANT
YANKAUER SUCT BULB TIP NO VENT (SUCTIONS) ×12 IMPLANT

## 2015-06-30 NOTE — OR Nursing (Signed)
RN contacted [facility] (spoke with Okey Regalarol) to provide patient update, plans for transfer to [facility] Room 14
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
## 2015-06-30 NOTE — Progress Notes (Signed)
OT Cancellation Note  Patient Details Name: [name] MRN: [id number] DOB: 09-29-1990   Cancelled Treatment:    Reason Eval/Treat Not Completed: Patient not medically ready (in traction currently pending workup)  Felecia ShellingJones, Itay Mella B   Maddix Kliewer, Brynn   OTR/L Pager: [phone] Office: [phone] .  06/30/2015, 7:10 AM

## 2015-06-30 NOTE — Progress Notes (Signed)
Pt arrived back to [facility] from OR at this time, placed pt back on vent previous settings.
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
## 2015-06-30 NOTE — Progress Notes (Signed)
PT Cancellation Note  Patient Details Name: Margart SicklesJasmine Cupples MRN: [id number] DOB: November 18, 1990   Cancelled Treatment:    Reason Eval/Treat Not Completed: Patient not medically ready (probably return to OR, pt currently in traction will hold)   Fabio AsaWerner, Draysen Weygandt J 06/30/2015, 7:29 AM Charlotte Crumbevon Javonta Gronau, PT DPT  [phone]
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
## 2015-06-30 NOTE — Transfer of Care (Signed)
Immediate Anesthesia Transfer of Care Note  Patient: [name]  Procedure(s) Performed: Procedure(s) with comments: IRRIGATION AND DEBRIDEMENT 3A TIBIA PROXIMAL PLATEAU AND SHAFT WITH  EXTERNAL FIXATOR ADJUSTMENT (Left)  TRANS-SACRAL SCREWS  (Bilateral) OPEN REDUCTION INTERNAL FIXATION (ORIF) ANTERIOR COLUMN ACETABULUM (Left) APPLICATION OF WOUND VAC  (Left) - L ankle and L calf IRRIGATION AND DEBRIDEMENT OPEN 3A LEFT ANKLE JOINT DISLOCATION AND OPEN LEFT BIMALLEOUS FRACTURE  (Left)  Patient Location: NICU  Anesthesia Type:General  Level of Consciousness: Patient remains intubated per anesthesia plan  Airway & Oxygen Therapy: Patient remains intubated per anesthesia plan and Patient placed on Ventilator (see vital sign flow sheet for setting)  Post-op Assessment: Report given to RN and Post -op Vital signs reviewed and stable  Post vital signs: Reviewed and stable  Last Vitals:  Filed Vitals:   06/30/15 0737 06/30/15 0800  BP: 143/79 117/78  Pulse: 95 107  Temp:  37.7 C  Resp: 24 24    Last Pain:  Filed Vitals:   06/30/15 0814  PainSc: Asleep         Complications: No apparent anesthesia complications

## 2015-06-30 NOTE — Anesthesia Postprocedure Evaluation (Signed)
Anesthesia Post Note  Patient: [name]  Procedure(s) Performed: Procedure(s) (LRB): IRRIGATION AND DEBRIDEMENT 3A TIBIA PROXIMAL PLATEAU AND SHAFT WITH  EXTERNAL FIXATOR ADJUSTMENT (Left)  TRANS-SACRAL SCREWS  (Bilateral) OPEN REDUCTION INTERNAL FIXATION (ORIF) ANTERIOR COLUMN ACETABULUM (Left) APPLICATION OF WOUND VAC  (Left) IRRIGATION AND DEBRIDEMENT OPEN 3A LEFT ANKLE JOINT DISLOCATION AND OPEN LEFT BIMALLEOUS FRACTURE  (Left)  Patient location during evaluation: SICU Anesthesia Type: General Level of consciousness: sedated Pain management: pain level controlled Vital Signs Assessment: post-procedure vital signs reviewed and stable Respiratory status: patient remains intubated per anesthesia plan Cardiovascular status: stable Anesthetic complications: no    Last Vitals:  Filed Vitals:   06/30/15 0737 06/30/15 0800  BP: 143/79 117/78  Pulse: 95 107  Temp:  37.7 C  Resp: 24 24    Last Pain:  Filed Vitals:   06/30/15 0814  PainSc: Asleep                 [name] DAVID

## 2015-06-30 NOTE — Progress Notes (Signed)
Pharmacy Antibiotic Note   9-hr vanc level appropriate for Q24H dosing of gent.     Recent Labs Lab 06/28/15 0353 06/28/15 0407 06/28/15 1545 06/29/15 0508 06/29/15 1315 06/30/15 0030  WBC 19.2*  --  12.8* 11.5*  --   --   CREATININE 1.21* 1.30* 0.85 0.85  --   --   LATICACIDVEN  --  7.52*  --   --  0.8  --   GENTRANDOM  --   --   --   --   --  2.8    Estimated Creatinine Clearance: 127.1 mL/min (by C-G formula based on Cr of 0.85).     Thank you for allowing pharmacy to be a part of this patient's care.  Vernard GamblesVeronda Uchechukwu Dhawan, PharmD, BCPS  06/30/2015 2:52 AM

## 2015-06-30 NOTE — OR Nursing (Signed)
23 antibiotic beads were placed into the open lateral wound below the knee.

## 2015-07-01 ENCOUNTER — Encounter (HOSPITAL_COMMUNITY): Payer: Self-pay | Admitting: Orthopedic Surgery

## 2015-07-01 ENCOUNTER — Inpatient Hospital Stay (HOSPITAL_COMMUNITY): Payer: BLUE CROSS/BLUE SHIELD

## 2015-07-01 LAB — CBC WITH DIFFERENTIAL/PLATELET
Basophils Absolute: 0 10*3/uL (ref 0.0–0.1)
Basophils Relative: 0 %
EOS PCT: 2 %
Eosinophils Absolute: 0.3 10*3/uL (ref 0.0–0.7)
HCT: 31.7 % — ABNORMAL LOW (ref 36.0–46.0)
HEMOGLOBIN: 10.4 g/dL — AB (ref 12.0–15.0)
LYMPHS ABS: 1.5 10*3/uL (ref 0.7–4.0)
LYMPHS PCT: 12 %
MCH: 28.2 pg (ref 26.0–34.0)
MCHC: 32.8 g/dL (ref 30.0–36.0)
MCV: 85.9 fL (ref 78.0–100.0)
Monocytes Absolute: 0.8 10*3/uL (ref 0.1–1.0)
Monocytes Relative: 7 %
NEUTROS PCT: 79 %
Neutro Abs: 9.8 10*3/uL — ABNORMAL HIGH (ref 1.7–7.7)
Platelets: 111 10*3/uL — ABNORMAL LOW (ref 150–400)
RBC: 3.69 MIL/uL — AB (ref 3.87–5.11)
RDW: 16.6 % — ABNORMAL HIGH (ref 11.5–15.5)
WBC: 12.4 10*3/uL — AB (ref 4.0–10.5)

## 2015-07-01 LAB — BASIC METABOLIC PANEL
Anion gap: 9 (ref 5–15)
CHLORIDE: 111 mmol/L (ref 101–111)
CO2: 18 mmol/L — AB (ref 22–32)
Calcium: 7.9 mg/dL — ABNORMAL LOW (ref 8.9–10.3)
Creatinine, Ser: 0.81 mg/dL (ref 0.44–1.00)
GFR calc Af Amer: >60 mL/min (ref >60)
GFR calc non Af Amer: >60 mL/min (ref >60)
GLUCOSE: 109 mg/dL — AB (ref 65–99)
POTASSIUM: 3.9 mmol/L (ref 3.5–5.1)
Sodium: 138 mmol/L (ref 135–145)

## 2015-07-01 LAB — TRIGLYCERIDES: Triglycerides: 224 mg/dL — ABNORMAL HIGH (ref <150)

## 2015-07-01 MED ORDER — ANTISEPTIC ORAL RINSE SOLUTION (CORINZ)
7.0000 mL | OROMUCOSAL | Status: DC
  Administered 2015-07-01 – 2015-07-02 (×12): 7 mL via OROMUCOSAL

## 2015-07-01 MED ORDER — CHLORHEXIDINE GLUCONATE 0.12% ORAL RINSE (MEDLINE KIT)
15.0000 mL | Freq: Two times a day (BID) | OROMUCOSAL | Status: DC
  Administered 2015-07-01 – 2015-07-02 (×2): 15 mL via OROMUCOSAL

## 2015-07-01 MED ORDER — DEXMEDETOMIDINE HCL IN NACL 400 MCG/100ML IV SOLN
0.0000 ug/kg/h | INTRAVENOUS | Status: DC
  Administered 2015-07-01 (×2): 0.4 ug/kg/h via INTRAVENOUS
  Administered 2015-07-01: 2 ug/kg/h via INTRAVENOUS
  Administered 2015-07-01: 1.8 ug/kg/h via INTRAVENOUS
  Administered 2015-07-01: 1.5 ug/kg/h via INTRAVENOUS
  Administered 2015-07-01: 1 ug/kg/h via INTRAVENOUS
  Administered 2015-07-02: 1.2 ug/kg/h via INTRAVENOUS
  Filled 2015-07-01 (×4): qty 100
  Filled 2015-07-01: qty 50
  Filled 2015-07-01 (×3): qty 100
  Filled 2015-07-01: qty 50

## 2015-07-01 MED ORDER — PIVOT 1.5 CAL PO LIQD
1000.0000 mL | ORAL | Status: DC
  Administered 2015-07-01: 1000 mL

## 2015-07-01 NOTE — Progress Notes (Signed)
Follow up - Trauma and Critical Care  Patient Details:    [name] is an 25 y.o. female.  Lines/tubes : Airway 7.5 mm (Active)  Secured at (cm) 23 cm 07/01/2015  8:41 AM  Measured From Lips 07/01/2015  8:41 AM  Secured Location Left 07/01/2015  8:41 AM  Secured By [employer] 07/01/2015  8:41 AM  Tube Holder Repositioned Yes 07/01/2015  8:41 AM  Cuff Pressure (cm H2O) 25 cm H2O 07/01/2015  8:41 AM  Site Condition Dry 07/01/2015  8:41 AM     PICC Double Lumen 06/29/15 PICC Right Basilic 38 cm 0 cm (Active)  Indication for Insertion or Continuance of Line Prolonged intravenous therapies 07/01/2015  7:31 AM  Exposed Catheter (cm) 0 cm 06/29/2015  2:35 PM  Site Assessment Clean;Dry;Intact 07/01/2015  7:31 AM  Lumen #1 Status Blood return noted;Flushed;Infusing 07/01/2015  7:31 AM  Lumen #2 Status Blood return noted;Flushed;Infusing 07/01/2015  7:31 AM  Dressing Type Transparent;Occlusive 07/01/2015  7:31 AM  Dressing Status Clean;Dry;Intact;Antimicrobial disc in place 07/01/2015  7:31 AM  Line Care Connections checked and tightened;Line pulled back 07/01/2015  7:31 AM  Line Adjustment (NICU/IV Team Only) No 07/01/2015  7:31 AM  Dressing Change Due 07/06/15 07/01/2015  7:31 AM     Arterial Line 06/30/15 Right Radial (Active)  Site Assessment Clean;Dry;Intact 07/01/2015  7:31 AM  Line Status Pulsatile blood flow 07/01/2015  7:31 AM  Art Line Waveform Square wave test performed 07/01/2015  7:31 AM  Art Line Interventions Zeroed and calibrated;Leveled;Connections checked and tightened;Flushed per protocol;Line pulled back 07/01/2015  7:31 AM  Color/Movement/Sensation Capillary refill less than 3 sec 07/01/2015  7:31 AM  Dressing Type Transparent;Occlusive 07/01/2015  7:31 AM  Dressing Status Intact;Dry;Clean 07/01/2015  7:31 AM  Dressing Change Due 07/07/15 07/01/2015  7:31 AM     Epidural Catheter 06/30/15 (Active)     Negative Pressure Wound Therapy Ankle Left;Lateral (Active)  Last dressing change 06/30/15  07/01/2015  7:44 AM  Site / Wound Assessment Dressing in place / Unable to assess 07/01/2015  7:44 AM  Peri-wound Assessment Other (Comment) 07/01/2015  7:44 AM  Canister Changed No 06/30/2015  2:49 PM  Dressing Status Intact 07/01/2015  7:44 AM  Output (mL) 0 mL 07/01/2015  6:00 AM     Negative Pressure Wound Therapy Leg Left;Lower (Active)  Last dressing change 06/30/15 07/01/2015  7:44 AM  Site / Wound Assessment Dressing in place / Unable to assess 07/01/2015  7:44 AM  Peri-wound Assessment Other (Comment) 07/01/2015  7:44 AM  Canister Changed No 06/30/2015  2:49 PM  Dressing Status Intact 07/01/2015  7:44 AM  Drainage Description Serous 06/30/2015  2:49 PM  Output (mL) 25 mL 07/01/2015  6:00 AM     NG/OG Tube Nasogastric Right nare (Active)  Placement Verification Auscultation 07/01/2015  7:44 AM  Site Assessment Clean;Dry;Intact 07/01/2015  7:44 AM  Status Suction-low intermittent 07/01/2015  7:44 AM  Amount of suction 121 mmHg 07/01/2015  7:44 AM  Drainage Appearance Green;Brown 07/01/2015  7:44 AM  Output (mL) 75 mL 07/01/2015  6:00 AM     Urethral Catheter Alma Downs, RN Latex;Straight-tip;Temperature probe 14 Fr. (Active)  Indication for Insertion or Continuance of Catheter Peri-operative use for selective surgical procedure;Unstable critical patients (first 24-48 hours) 07/01/2015  7:44 AM  Site Assessment Clean;Intact 07/01/2015  7:44 AM  Catheter Maintenance Bag below level of bladder;Catheter secured;Drainage bag/tubing not touching floor;Seal intact;No dependent loops;Insertion date on drainage bag 07/01/2015  8:00 AM  Collection Container  Standard drainage bag 07/01/2015  7:44 AM  Securement Method Securing device (Describe) 07/01/2015  7:44 AM  Urinary Catheter Interventions Unclamped 06/30/2015  2:49 PM  Output (mL) 140 mL 07/01/2015  9:00 AM    Microbiology/Sepsis markers: Results for orders placed or performed during the hospital encounter of 06/28/15  MRSA PCR Screening     Status: None   Collection  Time: 06/28/15 11:00 AM  Result Value Ref Range Status   MRSA by PCR NEGATIVE NEGATIVE Final    Comment:        The GeneXpert MRSA Assay (FDA approved for NASAL specimens only), is one component of a comprehensive MRSA colonization surveillance program. It is not intended to diagnose MRSA infection nor to guide or monitor treatment for MRSA infections.   Aerobic Culture (superficial specimen)     Status: None (Preliminary result)   Collection Time: 06/30/15  9:36 AM  Result Value Ref Range Status   Specimen Description WOUND LEFT LEG  Final   Special Requests PATIENT ON FOLLOWING  ANCEF AND GENTAMYCIN  Final   Gram Stain   Final    FEW WBC PRESENT, PREDOMINANTLY PMN NO ORGANISMS SEEN NO SQUAMOUS EPITHELIAL CELLS SEEN    Culture PENDING  Incomplete   Report Status PENDING  Incomplete    Anti-infectives:  Anti-infectives    Start     Dose/Rate Route Frequency Ordered Stop   06/30/15 1645  gentamicin (GARAMYCIN) 520 mg in dextrose 5 % 100 mL IVPB     520 mg 113 mL/hr over 60 Minutes Intravenous Every 24 hours 06/30/15 1638     06/30/15 1500  gentamicin (GARAMYCIN) 520 mg in dextrose 5 % 100 mL IVPB  Status:  Discontinued     520 mg 113 mL/hr over 60 Minutes Intravenous Every 24 hours 06/30/15 0249 06/30/15 1638   06/30/15 1055  vancomycin (VANCOCIN) powder  Status:  Discontinued       As needed 06/30/15 1104 06/30/15 1419   06/30/15 1051  gentamicin (GARAMYCIN) injection  Status:  Discontinued       As needed 06/30/15 1055 06/30/15 1419   06/29/15 1400  ceFAZolin (ANCEF) IVPB 2g/100 mL premix     2 g 200 mL/hr over 30 Minutes Intravenous Every 8 hours 06/29/15 1238     06/29/15 1400  gentamicin (GARAMYCIN) 520 mg in dextrose 5 % 100 mL IVPB     520 mg 113 mL/hr over 60 Minutes Intravenous  Once 06/29/15 1309 06/29/15 1634   06/28/15 1200  ceFAZolin (ANCEF) IVPB 2g/100 mL premix  Status:  Discontinued     2 g 200 mL/hr over 30 Minutes Intravenous Every 6 hours 06/28/15  1155 06/28/15 1211   06/28/15 1200  ceFAZolin (ANCEF) IVPB 1 g/50 mL premix  Status:  Discontinued     1 g 100 mL/hr over 30 Minutes Intravenous Every 8 hours 06/28/15 1055 06/28/15 1211   06/28/15 0430  ceFAZolin (ANCEF) IVPB 1 g/50 mL premix     1 g 100 mL/hr over 30 Minutes Intravenous  Once 06/28/15 0420 06/28/15 0520      Best Practice/Protocols:  VTE Prophylaxis: Lovenox (prophylaxtic dose) and Mechanical GI Prophylaxis: Proton Pump Inhibitor Intermittent Sedation Continous Sedation  Consults: Treatment Team:  Samson FredericBrian Swinteck, MD Myrene GalasMichael Handy, MD Newman PiesSu Teoh, MD Donalee CitrinGary Cram, MD    Events:  Subjective:    Overnight Issues: Patient did not get an epidural catheter in place.  She is stable this AM otherwise   Objective:  Vital signs for last  24 hours: Temp:  [94.5 F (34.7 C)-100 F (37.8 C)] 99.7 F (37.6 C) (06/07 0900) Pulse Rate:  [89-128] 113 (06/07 0900) Resp:  [0-25] 24 (06/07 0900) BP: (86-135)/(38-84) 101/59 mmHg (06/07 0900) SpO2:  [10 %-100 %] 100 % (06/07 0900) Arterial Line BP: (109-137)/(53-83) 114/57 mmHg (06/07 0900) FiO2 (%):  [40 %] 40 % (06/07 0900)  Hemodynamic parameters for last 24 hours:    Intake/Output from previous day: 06/06 0701 - 06/07 0700 In: 4080.7 [I.V.:3580.7; IV Piggyback:500] Out: 6570 [Urine:6080; Emesis/NG output:215; Drains:25; Blood:250]  Intake/Output this shift: Total I/O In: 419.7 [I.V.:419.7] Out: 420 [Urine:420]  Vent settings for last 24 hours: Vent Mode:  [-] PRVC FiO2 (%):  [40 %] 40 % Set Rate:  [24 bmp] 24 bmp Vt Set:  [550 mL] 550 mL PEEP:  [5 cmH20] 5 cmH20 Plateau Pressure:  [17 cmH20-20 cmH20] 17 cmH20  Physical Exam:  General: no respiratory distress and sedated but not agitated.  Did not wean well this AM on the ventilator Neuro: nonfocal exam and RASS -1 Resp: clear to auscultation bilaterally CVS: regular rate and rhythm, S1, S2 normal, no murmur, click, rub or gallop GI: soft, nontender, BS  WNL, no r/g and Not getting any nutritiion. Extremities: edema 1+ and Left leg still in traction  Results for orders placed or performed during the hospital encounter of 06/28/15 (from the past 24 hour(s))  I-STAT 7, (LYTES, BLD GAS, ICA, H+H)     Status: Abnormal   Collection Time: 06/30/15  1:20 PM  Result Value Ref Range   pH, Arterial 7.320 (L) 7.350 - 7.450   pCO2 arterial 39.6 35.0 - 45.0 mmHg   pO2, Arterial 141.0 (H) 80.0 - 100.0 mmHg   Bicarbonate 20.8 20.0 - 24.0 mEq/L   TCO2 22 0 - 100 mmol/L   O2 Saturation 99.0 %   Acid-base deficit 5.0 (H) 0.0 - 2.0 mmol/L   Sodium 142 135 - 145 mmol/L   Potassium 4.5 3.5 - 5.1 mmol/L   Calcium, Ion 1.10 (L) 1.12 - 1.23 mmol/L   HCT 32.0 (L) 36.0 - 46.0 %   Hemoglobin 10.9 (L) 12.0 - 15.0 g/dL   Patient temperature 16.1 C    Sample type ARTERIAL   CBC     Status: Abnormal   Collection Time: 06/30/15  5:35 PM  Result Value Ref Range   WBC 15.0 (H) 4.0 - 10.5 K/uL   RBC 4.05 3.87 - 5.11 MIL/uL   Hemoglobin 11.7 (L) 12.0 - 15.0 g/dL   HCT 09.6 (L) 04.5 - 40.9 %   MCV 86.2 78.0 - 100.0 fL   MCH 28.9 26.0 - 34.0 pg   MCHC 33.5 30.0 - 36.0 g/dL   RDW 81.1 (H) 91.4 - 78.2 %   Platelets 120 (L) 150 - 400 K/uL  Provider-confirm verbal Blood Bank order - Platelet Pheresis; 1 Unit; Order taken: 06/28/2015; 8:38 AM; Level 1 Trauma, Surgery     Status: None   Collection Time: 06/30/15  6:30 PM  Result Value Ref Range   Blood product order confirm MD AUTHORIZATION REQUESTED   Triglycerides     Status: Abnormal   Collection Time: 07/01/15  6:05 AM  Result Value Ref Range   Triglycerides 224 (H) <150 mg/dL  CBC with Differential/Platelet     Status: Abnormal   Collection Time: 07/01/15  6:05 AM  Result Value Ref Range   WBC 12.4 (H) 4.0 - 10.5 K/uL   RBC 3.69 (L) 3.87 - 5.11 MIL/uL  Hemoglobin 10.4 (L) 12.0 - 15.0 g/dL   HCT 40.9 (L) 81.1 - 91.4 %   MCV 85.9 78.0 - 100.0 fL   MCH 28.2 26.0 - 34.0 pg   MCHC 32.8 30.0 - 36.0 g/dL   RDW  78.2 (H) 95.6 - 15.5 %   Platelets 111 (L) 150 - 400 K/uL   Neutrophils Relative % 79 %   Neutro Abs 9.8 (H) 1.7 - 7.7 K/uL   Lymphocytes Relative 12 %   Lymphs Abs 1.5 0.7 - 4.0 K/uL   Monocytes Relative 7 %   Monocytes Absolute 0.8 0.1 - 1.0 K/uL   Eosinophils Relative 2 %   Eosinophils Absolute 0.3 0.0 - 0.7 K/uL   Basophils Relative 0 %   Basophils Absolute 0.0 0.0 - 0.1 K/uL  Basic metabolic panel     Status: Abnormal   Collection Time: 07/01/15  6:05 AM  Result Value Ref Range   Sodium 138 135 - 145 mmol/L   Potassium 3.9 3.5 - 5.1 mmol/L   Chloride 111 101 - 111 mmol/L   CO2 18 (L) 22 - 32 mmol/L   Glucose, Bld 109 (H) 65 - 99 mg/dL   BUN <5 (L) 6 - 20 mg/dL   Creatinine, Ser 2.13 0.44 - 1.00 mg/dL   Calcium 7.9 (L) 8.9 - 10.3 mg/dL   GFR calc non Af Amer >60 >60 mL/min   GFR calc Af Amer >60 >60 mL/min   Anion gap 9 5 - 15     Assessment/Plan:   NEURO  Altered Mental Status:  pain and sedation   Plan: Will be able to assess mental status more once the patient is closer to weaning.  I do not believe that that will be today because she will probably need an epidural catheter to help her with pain control.  PULM  Atelectasis/collapse (focal and mild bibasilar)   Plan: CPM  CARDIO  CPM   Plan: CPM  RENAL  No specific issues   Plan: CPM  GI  No issues   Plan: CPM, will start tube feedings today.  ID  No known infectious sources   Plan: CPM  HEME  Anemia acute blood loss anemia)   Plan: Not low enough to transfuse  ENDO No known issues   Plan: CPM  Global Issues  Wean on the ventilator when appropriate.  Probably not until after she has an epidural catheter in place and after Friday when she goes back to have more orthopedic procedures performed in the OR.    LOS: 3 days   Additional comments:I reviewed the patient's new clinical lab test results. cbc/bmet and I reviewed the patients new imaging test results. cxr  Critical Care Total Time*: 30  Minutes  Krishiv Sandler 07/01/2015  *Care during the described time interval was provided by me and/or other providers on the critical care team.  I have reviewed this patient's available data, including medical history, events of note, physical examination and test results as part of my evaluation.
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
## 2015-07-01 NOTE — Progress Notes (Signed)
PT Cancellation Note  Patient Details Name: Margart SicklesJasmine Mellette MRN: [id number] DOB: Jun 04, 1990   Cancelled Treatment:    Reason Eval/Treat Not Completed: Medical issues which prohibited therapy.  Intubated and sedated.  PT to continue to follow to check medical stability.   Thanks,   Rollene Rotundaebecca B. Georgana Romain, PT, DPT [phone]   07/01/2015, 9:42 AM

## 2015-07-01 NOTE — Progress Notes (Signed)
OT Cancellation Note  Patient Details Name: [name] MRN: [id number] DOB: 1990-07-17   Cancelled Treatment:    Reason Eval/Treat Not Completed: Medical issues which prohibited therapy.   Gaye AlkenBailey A Clayton Jarmon M.S., OTR/L Pager: [phone]  07/01/2015, 8:48 AM

## 2015-07-01 NOTE — Progress Notes (Signed)
Patient self extubated at 2215. Placed on 6L [place], decreased to 4L. Patient O2 saturations 100%, RR 15-20. Dr. Lindie SpruceWyatt notified. Fent gtt d/c, attempting to titrate down on precedex some. Order received for fentanyl pushes 50-13600mcg Q1H PRN for pain. Family notified. Will closely monitor.

## 2015-07-02 ENCOUNTER — Inpatient Hospital Stay (HOSPITAL_COMMUNITY): Payer: BLUE CROSS/BLUE SHIELD

## 2015-07-02 DIAGNOSIS — S32009A Unspecified fracture of unspecified lumbar vertebra, initial encounter for closed fracture: Secondary | ICD-10-CM | POA: Diagnosis present

## 2015-07-02 LAB — CBC WITH DIFFERENTIAL/PLATELET
BASOS ABS: 0 10*3/uL (ref 0.0–0.1)
Basophils Relative: 0 %
EOS ABS: 0.4 10*3/uL (ref 0.0–0.7)
EOS PCT: 3 %
HCT: 32.1 % — ABNORMAL LOW (ref 36.0–46.0)
HEMOGLOBIN: 10.4 g/dL — AB (ref 12.0–15.0)
LYMPHS ABS: 0.8 10*3/uL (ref 0.7–4.0)
Lymphocytes Relative: 6 %
MCH: 28.3 pg (ref 26.0–34.0)
MCHC: 32.4 g/dL (ref 30.0–36.0)
MCV: 87.2 fL (ref 78.0–100.0)
Monocytes Absolute: 1.1 10*3/uL — ABNORMAL HIGH (ref 0.1–1.0)
Monocytes Relative: 8 %
NEUTROS PCT: 83 %
Neutro Abs: 11.3 10*3/uL — ABNORMAL HIGH (ref 1.7–7.7)
PLATELETS: 124 10*3/uL — AB (ref 150–400)
RBC: 3.68 MIL/uL — AB (ref 3.87–5.11)
RDW: 16.8 % — ABNORMAL HIGH (ref 11.5–15.5)
WBC: 13.6 10*3/uL — AB (ref 4.0–10.5)

## 2015-07-02 LAB — TYPE AND SCREEN
ABO/RH(D): A POS
Antibody Screen: NEGATIVE
UNIT DIVISION: 0
UNIT DIVISION: 0
UNIT DIVISION: 0
UNIT DIVISION: 0
UNIT DIVISION: 0
UNIT DIVISION: 0
Unit division: 0
Unit division: 0
Unit division: 0
Unit division: 0
Unit division: 0
Unit division: 0
Unit division: 0
Unit division: 0
Unit division: 0
Unit division: 0

## 2015-07-02 LAB — URINE MICROSCOPIC-ADD ON: WBC, UA: NONE SEEN WBC/hpf (ref 0–5)

## 2015-07-02 LAB — AEROBIC CULTURE  (SUPERFICIAL SPECIMEN): CULTURE: NO GROWTH

## 2015-07-02 LAB — BASIC METABOLIC PANEL
ANION GAP: 6 (ref 5–15)
BUN: 6 mg/dL (ref 6–20)
CHLORIDE: 108 mmol/L (ref 101–111)
CO2: 22 mmol/L (ref 22–32)
Calcium: 8.5 mg/dL — ABNORMAL LOW (ref 8.9–10.3)
Creatinine, Ser: 0.7 mg/dL (ref 0.44–1.00)
Glucose, Bld: 131 mg/dL — ABNORMAL HIGH (ref 65–99)
POTASSIUM: 4.5 mmol/L (ref 3.5–5.1)
SODIUM: 136 mmol/L (ref 135–145)

## 2015-07-02 LAB — URINALYSIS, ROUTINE W REFLEX MICROSCOPIC
Bilirubin Urine: NEGATIVE
Glucose, UA: NEGATIVE mg/dL
Ketones, ur: NEGATIVE mg/dL
LEUKOCYTES UA: NEGATIVE
NITRITE: NEGATIVE
Protein, ur: NEGATIVE mg/dL
SPECIFIC GRAVITY, URINE: 1.013 (ref 1.005–1.030)
pH: 6 (ref 5.0–8.0)

## 2015-07-02 LAB — AEROBIC CULTURE W GRAM STAIN (SUPERFICIAL SPECIMEN)

## 2015-07-02 MED ORDER — ACETAMINOPHEN 10 MG/ML IV SOLN
1000.0000 mg | Freq: Four times a day (QID) | INTRAVENOUS | Status: DC
  Administered 2015-07-02 – 2015-07-03 (×2): 1000 mg via INTRAVENOUS
  Filled 2015-07-02 (×2): qty 100

## 2015-07-02 MED ORDER — OXYCODONE HCL 5 MG PO TABS
5.0000 mg | ORAL_TABLET | ORAL | Status: DC | PRN
  Administered 2015-07-02 (×2): 15 mg via ORAL
  Filled 2015-07-02 (×2): qty 3

## 2015-07-02 MED ORDER — MORPHINE SULFATE (PF) 2 MG/ML IV SOLN
2.0000 mg | INTRAVENOUS | Status: DC | PRN
Start: 2015-07-02 — End: 2015-07-14
  Administered 2015-07-02 – 2015-07-14 (×22): 2 mg via INTRAVENOUS
  Filled 2015-07-02 (×22): qty 1

## 2015-07-02 MED ORDER — FENTANYL CITRATE (PF) 100 MCG/2ML IJ SOLN
50.0000 ug | INTRAMUSCULAR | Status: DC | PRN
  Administered 2015-07-02: 100 ug via INTRAVENOUS
  Filled 2015-07-02: qty 2

## 2015-07-02 MED ORDER — FENTANYL CITRATE (PF) 100 MCG/2ML IJ SOLN
50.0000 ug | INTRAMUSCULAR | Status: DC | PRN
  Administered 2015-07-02 (×3): 100 ug via INTRAVENOUS
  Administered 2015-07-03: 50 ug via INTRAVENOUS
  Administered 2015-07-03 (×2): 100 ug via INTRAVENOUS
  Administered 2015-07-03 (×12): 50 ug via INTRAVENOUS
  Administered 2015-07-05 – 2015-07-08 (×13): 100 ug via INTRAVENOUS
  Administered 2015-07-08: 50 ug via INTRAVENOUS
  Filled 2015-07-02 (×20): qty 2

## 2015-07-02 MED ORDER — ENOXAPARIN SODIUM 40 MG/0.4ML ~~LOC~~ SOLN
40.0000 mg | SUBCUTANEOUS | Status: DC
  Administered 2015-07-02 – 2015-07-12 (×10): 40 mg via SUBCUTANEOUS
  Filled 2015-07-02 (×12): qty 0.4

## 2015-07-02 MED ORDER — FENTANYL CITRATE (PF) 100 MCG/2ML IJ SOLN
INTRAMUSCULAR | Status: AC
  Filled 2015-07-02: qty 2

## 2015-07-02 MED ORDER — DOCUSATE SODIUM 100 MG PO CAPS
100.0000 mg | ORAL_CAPSULE | Freq: Two times a day (BID) | ORAL | Status: DC
  Administered 2015-07-02 (×2): 100 mg via ORAL
  Filled 2015-07-02 (×2): qty 1

## 2015-07-02 MED ORDER — POLYETHYLENE GLYCOL 3350 17 G PO PACK
17.0000 g | PACK | Freq: Every day | ORAL | Status: DC
  Administered 2015-07-02 – 2015-07-14 (×10): 17 g via ORAL
  Filled 2015-07-02 (×11): qty 1

## 2015-07-02 MED ORDER — FENTANYL CITRATE (PF) 100 MCG/2ML IJ SOLN
INTRAMUSCULAR | Status: AC
  Administered 2015-07-02: 50 ug
  Filled 2015-07-02: qty 2

## 2015-07-02 MED ORDER — WHITE PETROLATUM GEL
Status: AC
  Filled 2015-07-02: qty 1

## 2015-07-02 NOTE — Progress Notes (Signed)
PT Cancellation Note  Patient Details Name: Margart SicklesJasmine Bridgett MRN: [id number] DOB: 04-28-1990   Cancelled Treatment:    Reason Eval/Treat Not Completed: Medical issues which prohibited therapy (probably return to OR, pt currently in traction will sign off and request new orders).   Fabio AsaWerner, Dillon Mcreynolds J 07/02/2015, 10:37 AM Charlotte Crumbevon Elan Mcelvain, PT DPT  [phone]
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
## 2015-07-02 NOTE — Progress Notes (Signed)
Patient ID: Elizabeth SicklesJasmine Dyer, female   DOB: [birth year], 25 y.o.   MRN: [id number]   LOS: 4 days   Subjective: Doing ok after extubation. Pain not excessive.    Objective: Vital signs in last 24 hours: Temp:  [98.4 F (36.9 C)-101.7 F (38.7 C)] 101.7 F (38.7 C) (06/08 0700) Pulse Rate:  [82-126] 126 (06/08 0700) Resp:  [0-37] 36 (06/08 0700) BP: (76-110)/(47-77) 100/47 mmHg (06/08 0700) SpO2:  [95 %-100 %] 97 % (06/08 0942) Arterial Line BP: (81-131)/(51-119) 97/81 mmHg (06/08 0700) FiO2 (%):  [40 %] 40 % (06/07 2100) Last BM Date:  (pta)   Laboratory  CBC  Recent Labs  07/01/15 0605 07/02/15 0535  WBC 12.4* 13.6*  HGB 10.4* 10.4*  HCT 31.7* 32.1*  PLT 111* 124*   BMET  Recent Labs  07/01/15 0605 07/02/15 0535  NA 138 136  K 3.9 4.5  CL 111 108  CO2 18* 22  GLUCOSE 109* 131*  BUN <5* 6  CREATININE 0.81 0.70  CALCIUM 7.9* 8.5*    Physical Exam General appearance: alert and no distress Resp: clear to auscultation bilaterally Cardio: regular rate and rhythm GI: Soft, NT, diminished BS Extremities: NVI   Assessment/Plan: PHBC Nasal FX - per Dr. Suszanne Connerseoh C7 TVP FX - per Dr. Wynetta Emeryram, will get flex/ex once able to undergo study Foreign body in esophagus - tongue ring connector, appears now in distal esophagus. Should pass. R 1,2, 4-10 and L 1,3,5 rib FX and pulm contusions/B occult PTX - no PTX on CXR this AM, see below L2 TVP FX Pelvic FX with R obturator ring and L acetabulum and sacral ala with SI diastasis s/p SI screws and anterior wall repair  - per Dr. Carola FrostHandy, NWB. OR tomorrow for acetabulum Left femur fx s/p ex fix -- per Dr. Carola FrostHandy, OR tomorrow L tibial plateau and fibula FX - S/P spanning ex fix by Dr. Linna CapriceSwinteck, Dr. Carola FrostHandy managing, for OR tomorrow Left ankle fx/dislocation s/p ORIF -- per Dr. Nelda MarseilleHandy Vent dependent resp failure - Self-extubated but doing well ABL anemia -- Stable ID -- Check UA, CXR for mild fevers and leukocytosis FEN - Start clears VTE  - SCD's, start Lovenox DIspo - continue ICU today    Freeman CaldronMichael J. Lucien Budney, [name] Pager: [phone] General Trauma PA Pager: [phone]  07/02/2015
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
## 2015-07-03 ENCOUNTER — Encounter (HOSPITAL_COMMUNITY): Payer: Self-pay | Admitting: Orthopedic Surgery

## 2015-07-03 ENCOUNTER — Inpatient Hospital Stay (HOSPITAL_COMMUNITY): Payer: BLUE CROSS/BLUE SHIELD | Admitting: Certified Registered"

## 2015-07-03 ENCOUNTER — Inpatient Hospital Stay (HOSPITAL_COMMUNITY): Payer: BLUE CROSS/BLUE SHIELD

## 2015-07-03 ENCOUNTER — Encounter (HOSPITAL_COMMUNITY): Admission: EM | Disposition: A | Discharge status: Rehabilitation Facility | Payer: Self-pay | Source: Home / Self Care

## 2015-07-03 HISTORY — PX: I & D EXTREMITY: SHX5045

## 2015-07-03 HISTORY — PX: FEMUR IM NAIL: SHX1597

## 2015-07-03 HISTORY — PX: ORIF TIBIA PLATEAU: SHX2132

## 2015-07-03 HISTORY — PX: EXTERNAL FIXATION LEG: SHX1549

## 2015-07-03 HISTORY — PX: ORIF ACETABULAR FRACTURE: SHX5029

## 2015-07-03 LAB — CBC
HEMATOCRIT: 32.9 % — AB (ref 36.0–46.0)
HEMATOCRIT: 29.8 % — AB (ref 36.0–46.0)
HEMOGLOBIN: 10.6 g/dL — AB (ref 12.0–15.0)
Hemoglobin: 9.6 g/dL — ABNORMAL LOW (ref 12.0–15.0)
MCH: 28.3 pg (ref 26.0–34.0)
MCH: 27.9 pg (ref 26.0–34.0)
MCHC: 32.2 g/dL (ref 30.0–36.0)
MCHC: 32.2 g/dL (ref 30.0–36.0)
MCV: 87.7 fL (ref 78.0–100.0)
MCV: 86.6 fL (ref 78.0–100.0)
Platelets: 166 10*3/uL (ref 150–400)
Platelets: 173 10*3/uL (ref 150–400)
RBC: 3.75 MIL/uL — ABNORMAL LOW (ref 3.87–5.11)
RBC: 3.44 MIL/uL — AB (ref 3.87–5.11)
RDW: 17.3 % — ABNORMAL HIGH (ref 11.5–15.5)
RDW: 17.5 % — ABNORMAL HIGH (ref 11.5–15.5)
WBC: 12.7 10*3/uL — ABNORMAL HIGH (ref 4.0–10.5)
WBC: 14.2 10*3/uL — AB (ref 4.0–10.5)

## 2015-07-03 LAB — COMPREHENSIVE METABOLIC PANEL
ALK PHOS: 66 U/L (ref 38–126)
ALT: 98 U/L — AB (ref 14–54)
ANION GAP: 5 (ref 5–15)
AST: 111 U/L — ABNORMAL HIGH (ref 15–41)
Albumin: 2.9 g/dL — ABNORMAL LOW (ref 3.5–5.0)
BILIRUBIN TOTAL: 2.2 mg/dL — AB (ref 0.3–1.2)
BUN: 8 mg/dL (ref 6–20)
CALCIUM: 8.5 mg/dL — AB (ref 8.9–10.3)
CO2: 23 mmol/L (ref 22–32)
CREATININE: 0.89 mg/dL (ref 0.44–1.00)
Chloride: 107 mmol/L (ref 101–111)
GFR calc non Af Amer: >60 mL/min (ref >60)
GLUCOSE: 109 mg/dL — AB (ref 65–99)
Potassium: 4.1 mmol/L (ref 3.5–5.1)
Sodium: 135 mmol/L (ref 135–145)
TOTAL PROTEIN: 6.6 g/dL (ref 6.5–8.1)

## 2015-07-03 LAB — I-STAT BETA HCG BLOOD, ED (NOT ORDERABLE): I-stat hCG, quantitative: <5 m[IU]/mL (ref <5)

## 2015-07-03 LAB — PREPARE RBC (CROSSMATCH)

## 2015-07-03 LAB — TRIGLYCERIDES: Triglycerides: 222 mg/dL — ABNORMAL HIGH (ref <150)

## 2015-07-03 SURGERY — IRRIGATION AND DEBRIDEMENT EXTREMITY
Anesthesia: General | Site: Leg Upper

## 2015-07-03 MED ORDER — CEFAZOLIN SODIUM 1 G IJ SOLR
INTRAMUSCULAR | Status: AC
  Filled 2015-07-03: qty 20

## 2015-07-03 MED ORDER — ROCURONIUM BROMIDE 100 MG/10ML IV SOLN
INTRAVENOUS | Status: DC | PRN
  Administered 2015-07-03 (×8): 20 mg via INTRAVENOUS
  Administered 2015-07-03: 10 mg via INTRAVENOUS
  Administered 2015-07-03 (×2): 20 mg via INTRAVENOUS
  Administered 2015-07-03: 30 mg via INTRAVENOUS

## 2015-07-03 MED ORDER — PROPOFOL 1000 MG/100ML IV EMUL
INTRAVENOUS | Status: AC
  Filled 2015-07-03: qty 100

## 2015-07-03 MED ORDER — PROPOFOL 1000 MG/100ML IV EMUL
0.0000 ug/kg/min | INTRAVENOUS | Status: DC
  Administered 2015-07-03: 30 ug/kg/min via INTRAVENOUS
  Administered 2015-07-03 – 2015-07-04 (×4): 50 ug/kg/min via INTRAVENOUS
  Filled 2015-07-03 (×4): qty 100

## 2015-07-03 MED ORDER — CEFAZOLIN SODIUM-DEXTROSE 2-3 GM-% IV SOLR
INTRAVENOUS | Status: DC | PRN
  Administered 2015-07-03: 2 g via INTRAVENOUS

## 2015-07-03 MED ORDER — MIDAZOLAM HCL 5 MG/5ML IJ SOLN
INTRAMUSCULAR | Status: DC | PRN
  Administered 2015-07-03: 2 mg via INTRAVENOUS

## 2015-07-03 MED ORDER — LACTATED RINGERS IV SOLN
INTRAVENOUS | Status: DC | PRN
  Administered 2015-07-03 (×3): via INTRAVENOUS

## 2015-07-03 MED ORDER — CEFAZOLIN SODIUM-DEXTROSE 2-4 GM/100ML-% IV SOLN
2.0000 g | Freq: Three times a day (TID) | INTRAVENOUS | Status: DC
  Administered 2015-07-03 – 2015-07-06 (×8): 2 g via INTRAVENOUS
  Filled 2015-07-03 (×12): qty 100

## 2015-07-03 MED ORDER — FENTANYL CITRATE (PF) 250 MCG/5ML IJ SOLN
INTRAMUSCULAR | Status: AC
  Filled 2015-07-03: qty 5

## 2015-07-03 MED ORDER — FENTANYL CITRATE (PF) 100 MCG/2ML IJ SOLN: 50.0000 ug | Freq: Once | INTRAMUSCULAR | Status: DC

## 2015-07-03 MED ORDER — FENTANYL BOLUS VIA INFUSION
50.0000 ug | INTRAVENOUS | Status: DC | PRN
  Filled 2015-07-03: qty 50

## 2015-07-03 MED ORDER — PROPOFOL 10 MG/ML IV BOLUS
INTRAVENOUS | Status: DC | PRN
  Administered 2015-07-03: 150 mg via INTRAVENOUS

## 2015-07-03 MED ORDER — LIDOCAINE HCL (CARDIAC) 20 MG/ML IV SOLN
INTRAVENOUS | Status: DC | PRN
  Administered 2015-07-03: 30 mg via INTRAVENOUS

## 2015-07-03 MED ORDER — LACTATED RINGERS IV SOLN
INTRAVENOUS | Status: DC | PRN
  Administered 2015-07-03: 08:00:00 via INTRAVENOUS

## 2015-07-03 MED ORDER — ROCURONIUM BROMIDE 50 MG/5ML IV SOLN
INTRAVENOUS | Status: AC
  Filled 2015-07-03: qty 2

## 2015-07-03 MED ORDER — PHENYLEPHRINE HCL 10 MG/ML IJ SOLN
INTRAMUSCULAR | Status: DC | PRN
  Administered 2015-07-03: 80 ug via INTRAVENOUS

## 2015-07-03 MED ORDER — HYDROMORPHONE HCL 1 MG/ML IJ SOLN: 0.2500 mg | INTRAMUSCULAR | Status: DC | PRN

## 2015-07-03 MED ORDER — SUCCINYLCHOLINE CHLORIDE 20 MG/ML IJ SOLN
INTRAMUSCULAR | Status: DC | PRN
  Administered 2015-07-03: 120 mg via INTRAVENOUS

## 2015-07-03 MED ORDER — FENTANYL CITRATE (PF) 2500 MCG/50ML IJ SOLN
25.0000 ug/h | INTRAMUSCULAR | Status: DC
  Administered 2015-07-03: 50 ug/h via INTRAVENOUS
  Administered 2015-07-04: 300 ug/h via INTRAVENOUS
  Administered 2015-07-04: 200 ug/h via INTRAVENOUS
  Filled 2015-07-03 (×4): qty 50

## 2015-07-03 MED ORDER — SODIUM CHLORIDE 0.9 % IR SOLN
Status: DC | PRN
  Administered 2015-07-03: 10000 mL

## 2015-07-03 MED ORDER — DEXMEDETOMIDINE HCL IN NACL 200 MCG/50ML IV SOLN
INTRAVENOUS | Status: DC | PRN
Start: 2015-07-03 — End: 2015-07-03
  Administered 2015-07-03: 0.7 ug/kg/h via INTRAVENOUS

## 2015-07-03 MED ORDER — ONDANSETRON HCL 4 MG/2ML IJ SOLN: 4.0000 mg | Freq: Once | INTRAMUSCULAR | Status: DC | PRN

## 2015-07-03 MED ORDER — SODIUM CHLORIDE 0.9 % IV SOLN: 10.0000 mL/h | Freq: Once | INTRAVENOUS | Status: DC

## 2015-07-03 MED ORDER — IPRATROPIUM-ALBUTEROL 0.5-2.5 (3) MG/3ML IN SOLN
3.0000 mL | Freq: Four times a day (QID) | RESPIRATORY_TRACT | Status: DC
  Administered 2015-07-03 – 2015-07-04 (×3): 3 mL via RESPIRATORY_TRACT
  Filled 2015-07-03 (×3): qty 3

## 2015-07-03 MED ORDER — PHENYLEPHRINE 40 MCG/ML (10ML) SYRINGE FOR IV PUSH (FOR BLOOD PRESSURE SUPPORT)
PREFILLED_SYRINGE | INTRAVENOUS | Status: AC
  Filled 2015-07-03: qty 10

## 2015-07-03 MED ORDER — PROPOFOL 10 MG/ML IV BOLUS
INTRAVENOUS | Status: AC
  Filled 2015-07-03: qty 20

## 2015-07-03 MED ORDER — PROPOFOL 10 MG/ML IV BOLUS
INTRAVENOUS | Status: AC
Start: 2015-07-03 — End: 2015-07-03
  Filled 2015-07-03: qty 20

## 2015-07-03 MED ORDER — ACETAMINOPHEN 10 MG/ML IV SOLN
1000.0000 mg | Freq: Four times a day (QID) | INTRAVENOUS | Status: AC
  Administered 2015-07-03 – 2015-07-04 (×4): 1000 mg via INTRAVENOUS
  Filled 2015-07-03 (×4): qty 100

## 2015-07-03 MED ORDER — MIDAZOLAM HCL 2 MG/2ML IJ SOLN
INTRAMUSCULAR | Status: AC
  Filled 2015-07-03: qty 2

## 2015-07-03 SURGICAL SUPPLY — 178 items
APPLIER CLIP 11 MED OPEN (CLIP)
BANDAGE ACE 6X5 VEL STRL LF (GAUZE/BANDAGES/DRESSINGS) ×5 IMPLANT
BANDAGE ELASTIC 4 VELCRO ST LF (GAUZE/BANDAGES/DRESSINGS) ×5 IMPLANT
BANDAGE ELASTIC 6 VELCRO ST LF (GAUZE/BANDAGES/DRESSINGS) ×5 IMPLANT
BANDAGE ESMARK 6X9 LF (GAUZE/BANDAGES/DRESSINGS) ×3 IMPLANT
BIT DRILL 100X2.5XANTM LCK (BIT) ×3 IMPLANT
BIT DRILL AO MATTA 2.5MX230M (BIT) ×3 IMPLANT
BIT DRILL CAL (BIT) ×3 IMPLANT
BIT DRILL CALIBRATED 4.3MMX365 (DRILL) ×3 IMPLANT
BIT DRILL CROWE PNT TWST 4.5MM (DRILL) ×3 IMPLANT
BIT DRILL STEP 3.5 (DRILL) ×3 IMPLANT
BIT DRL 100X2.5XANTM LCK (BIT) ×3
BLADE 15 SAFETY STRL DISP (BLADE) ×5 IMPLANT
BLADE SURG 10 STRL SS (BLADE) ×5 IMPLANT
BLADE SURG 15 STRL LF DISP TIS (BLADE) ×3 IMPLANT
BLADE SURG 15 STRL SS (BLADE) ×2
BLADE SURG ROTATE 9660 (MISCELLANEOUS) IMPLANT
BNDG COHESIVE 4X5 TAN STRL (GAUZE/BANDAGES/DRESSINGS) ×5 IMPLANT
BNDG COHESIVE 6X5 TAN STRL LF (GAUZE/BANDAGES/DRESSINGS) ×5 IMPLANT
BNDG ESMARK 6X9 LF (GAUZE/BANDAGES/DRESSINGS) ×5
BNDG GAUZE ELAST 4 BULKY (GAUZE/BANDAGES/DRESSINGS) ×5 IMPLANT
BNDG GAUZE STRTCH 6 (GAUZE/BANDAGES/DRESSINGS) ×15 IMPLANT
BRUSH SCRUB DISP (MISCELLANEOUS) ×10 IMPLANT
CANISTER SUCT 3000ML PPV (MISCELLANEOUS) ×5 IMPLANT
CLEANER TIP ELECTROSURG 2X2 (MISCELLANEOUS) ×5 IMPLANT
CLIP APPLIE 11 MED OPEN (CLIP) IMPLANT
CLOSURE WOUND 1/2 X4 (GAUZE/BANDAGES/DRESSINGS) ×1
COVER MAYO STAND STRL (DRAPES) ×5 IMPLANT
COVER PERINEAL POST (MISCELLANEOUS) ×5 IMPLANT
COVER SURGICAL LIGHT HANDLE (MISCELLANEOUS) ×10 IMPLANT
CUFF TOURNIQUET SINGLE 18IN (TOURNIQUET CUFF) IMPLANT
CUFF TOURNIQUET SINGLE 24IN (TOURNIQUET CUFF) IMPLANT
CUFF TOURNIQUET SINGLE 34IN LL (TOURNIQUET CUFF) ×5 IMPLANT
DRAIN CHANNEL 10F 3/8 F FF (DRAIN) IMPLANT
DRAIN CHANNEL 15F RND FF W/TCR (WOUND CARE) IMPLANT
DRAPE C-ARM 42X72 X-RAY (DRAPES) ×5 IMPLANT
DRAPE C-ARMOR (DRAPES) ×5 IMPLANT
DRAPE EXTREMITY T 121X128X90 (DRAPE) IMPLANT
DRAPE INCISE IOBAN 66X45 STRL (DRAPES) ×5 IMPLANT
DRAPE INCISE IOBAN 85X60 (DRAPES) ×10 IMPLANT
DRAPE ORTHO SPLIT 77X108 STRL (DRAPES) ×4
DRAPE PROXIMA HALF (DRAPES) ×5 IMPLANT
DRAPE STERI IOBAN 125X83 (DRAPES) ×5 IMPLANT
DRAPE SURG ORHT 6 SPLT 77X108 (DRAPES) ×6 IMPLANT
DRAPE U-SHAPE 47X51 STRL (DRAPES) ×5 IMPLANT
DRILL BIT 2.5MM (BIT) ×2
DRILL BIT AO MATTA 2.5MX230M (BIT) ×5
DRILL BIT CAL (BIT) ×5
DRILL CALIBRATED 4.3MMX365 (DRILL) ×5
DRILL CROWE POINT TWIST 4.5MM (DRILL) ×5
DRILL STEP 3.5 (DRILL) ×5
DRSG ADAPTIC 3X8 NADH LF (GAUZE/BANDAGES/DRESSINGS) ×5 IMPLANT
DRSG AQUACEL AG ADV 3.5X10 (GAUZE/BANDAGES/DRESSINGS) ×5 IMPLANT
DRSG AQUACEL AG ADV 3.5X14 (GAUZE/BANDAGES/DRESSINGS) ×10 IMPLANT
DRSG EMULSION OIL 3X3 NADH (GAUZE/BANDAGES/DRESSINGS) ×5 IMPLANT
DRSG MEPILEX BORDER 4X12 (GAUZE/BANDAGES/DRESSINGS) IMPLANT
DRSG MEPILEX BORDER 4X4 (GAUZE/BANDAGES/DRESSINGS) ×5 IMPLANT
DRSG MEPILEX BORDER 4X8 (GAUZE/BANDAGES/DRESSINGS) ×5 IMPLANT
DRSG MEPITEL 4X7.2 (GAUZE/BANDAGES/DRESSINGS) ×5 IMPLANT
DRSG PAD ABDOMINAL 8X10 ST (GAUZE/BANDAGES/DRESSINGS) ×20 IMPLANT
DRSG VAC ATS MED SENSATRAC (GAUZE/BANDAGES/DRESSINGS) ×5 IMPLANT
ELECT BLADE 6.5 EXT (BLADE) IMPLANT
ELECT CAUTERY BLADE 6.4 (BLADE) IMPLANT
ELECT REM PT RETURN 9FT ADLT (ELECTROSURGICAL) ×5
ELECTRODE REM PT RTRN 9FT ADLT (ELECTROSURGICAL) ×3 IMPLANT
EVACUATOR 1/8 PVC DRAIN (DRAIN) IMPLANT
EVACUATOR 3/16  PVC DRAIN (DRAIN)
EVACUATOR 3/16 PVC DRAIN (DRAIN) IMPLANT
EVACUATOR SILICONE 100CC (DRAIN) ×5 IMPLANT
GAUZE SPONGE 4X4 12PLY STRL (GAUZE/BANDAGES/DRESSINGS) ×5 IMPLANT
GAUZE SPONGE 4X4 16PLY XRAY LF (GAUZE/BANDAGES/DRESSINGS) IMPLANT
GLOVE BIO SURGEON STRL SZ7.5 (GLOVE) ×5 IMPLANT
GLOVE BIO SURGEON STRL SZ8 (GLOVE) ×5 IMPLANT
GLOVE BIOGEL PI IND STRL 7.5 (GLOVE) ×3 IMPLANT
GLOVE BIOGEL PI IND STRL 8 (GLOVE) ×3 IMPLANT
GLOVE BIOGEL PI INDICATOR 7.5 (GLOVE) ×2
GLOVE BIOGEL PI INDICATOR 8 (GLOVE) ×2
GOWN STRL REUS W/ TWL LRG LVL3 (GOWN DISPOSABLE) ×6 IMPLANT
GOWN STRL REUS W/ TWL XL LVL3 (GOWN DISPOSABLE) ×6 IMPLANT
GOWN STRL REUS W/TWL LRG LVL3 (GOWN DISPOSABLE) ×4
GOWN STRL REUS W/TWL XL LVL3 (GOWN DISPOSABLE) ×4
GUIDEWIRE BEAD TIP (WIRE) ×5 IMPLANT
HANDPIECE INTERPULSE COAX TIP (DISPOSABLE) ×4
ILLUMINATOR WAVEGUIDE N/F (MISCELLANEOUS) ×5 IMPLANT
IMMOBILIZER KNEE 22 UNIV (SOFTGOODS) ×5 IMPLANT
K-WIRE ACE 1.6X6 (WIRE) ×10
KIT BASIN OR (CUSTOM PROCEDURE TRAY) ×5 IMPLANT
KIT INFUSE LRG II (Orthopedic Implant) ×5 IMPLANT
KIT ROOM TURNOVER OR (KITS) ×5 IMPLANT
KWIRE ACE 1.6X6 (WIRE) ×6 IMPLANT
LIGHT ORTHO (MISCELLANEOUS) IMPLANT
LINER BOOT UNIVERSAL DISP (MISCELLANEOUS) ×5 IMPLANT
LOOP VESSEL MAXI BLUE (MISCELLANEOUS) IMPLANT
MANIFOLD NEPTUNE II (INSTRUMENTS) ×5 IMPLANT
NAIL FEM RETRO 10.5X420 (Nail) ×5 IMPLANT
NDL SUT 6 .5 CRC .975X.05 MAYO (NEEDLE) ×3 IMPLANT
NEEDLE 22X1 1/2 (OR ONLY) (NEEDLE) IMPLANT
NEEDLE MAYO TAPER (NEEDLE) ×2
NS IRRIG 1000ML POUR BTL (IV SOLUTION) ×5 IMPLANT
PACK GENERAL/GYN (CUSTOM PROCEDURE TRAY) ×5 IMPLANT
PACK ORTHO EXTREMITY (CUSTOM PROCEDURE TRAY) ×5 IMPLANT
PACK TOTAL JOINT (CUSTOM PROCEDURE TRAY) ×5 IMPLANT
PAD ARMBOARD 7.5X6 YLW CONV (MISCELLANEOUS) ×10 IMPLANT
PAD CAST 4YDX4 CTTN HI CHSV (CAST SUPPLIES) ×3 IMPLANT
PADDING CAST COTTON 4X4 STRL (CAST SUPPLIES) ×2
PADDING CAST COTTON 6X4 STRL (CAST SUPPLIES) ×15 IMPLANT
PIN APEX 6X180MM EXFIX (EXFIX) ×5 IMPLANT
PLATE ACET STRT 94.5M 8H (Plate) ×5 IMPLANT
PLATE LOCK 11H STD LT PROX TIB (Plate) ×5 IMPLANT
PLATE SPRING 3.5MM 3H STRL (Plate) ×5 IMPLANT
PLATE TUB 100DEG 5 HO (Plate) ×5 IMPLANT
RETRIEVER SUT HEWSON (MISCELLANEOUS) ×5 IMPLANT
SCREW CORT 2.5XFT 44X3.5XST (Screw) ×3 IMPLANT
SCREW CORT FT 32X3.5XNONLOCK (Screw) ×3 IMPLANT
SCREW CORT T15 TPR 55X3.5XST (Screw) IMPLANT
SCREW CORT TI DBL LEAD 5X30 (Screw) ×5 IMPLANT
SCREW CORT TI DBL LEAD 5X56 (Screw) ×5 IMPLANT
SCREW CORT TI DBL LEAD 5X75 (Screw) ×5 IMPLANT
SCREW CORT TI DBLE LEAD 5X28 (Screw) ×5 IMPLANT
SCREW CORTEX ST MATTA 3.5X24 (Screw) ×10 IMPLANT
SCREW CORTEX ST MATTA 3.5X28MM (Screw) ×10 IMPLANT
SCREW CORTEX ST MATTA 3.5X32MM (Screw) ×5 IMPLANT
SCREW CORTEX ST MATTA 3.5X34MM (Screw) ×5 IMPLANT
SCREW CORTICAL 3.5MM  10MM (Screw) ×2 IMPLANT
SCREW CORTICAL 3.5MM  12MM (Screw) ×6 IMPLANT
SCREW CORTICAL 3.5MM  28MM (Screw) ×2 IMPLANT
SCREW CORTICAL 3.5MM  30MM (Screw) ×2 IMPLANT
SCREW CORTICAL 3.5MM  32MM (Screw) ×2 IMPLANT
SCREW CORTICAL 3.5MM 10MM (Screw) ×3 IMPLANT
SCREW CORTICAL 3.5MM 12MM (Screw) ×9 IMPLANT
SCREW CORTICAL 3.5MM 26MM (Screw) ×5 IMPLANT
SCREW CORTICAL 3.5MM 28MM (Screw) ×3 IMPLANT
SCREW CORTICAL 3.5MM 30MM (Screw) ×3 IMPLANT
SCREW CORTICAL 3.5X44MM (Screw) ×2 IMPLANT
SCREW CORTICAL 3.5X55MM (Screw) IMPLANT
SCREW LOCK 3.5X65 816135065 (Screw) ×5 IMPLANT
SCREW LOCK 3.5X65 DIST TIB (Screw) ×10 IMPLANT
SCREW LOCK 3.5X70 816135070 (Screw) ×5 IMPLANT
SCREW LOCK CORT STAR 3.5X26 (Screw) ×5 IMPLANT
SCREW LOCK CORT STAR 3.5X30 (Screw) ×5 IMPLANT
SCREW LOCK CORT STAR 3.5X60 (Screw) ×10 IMPLANT
SCREW LOCK CORT STAR 3.5X70 (Screw) ×10 IMPLANT
SCREW LP 3.5X70MM (Screw) IMPLANT
SET HNDPC FAN SPRY TIP SCT (DISPOSABLE) ×6 IMPLANT
SPONGE LAP 18X18 X RAY DECT (DISPOSABLE) ×5 IMPLANT
SPONGE SCRUB IODOPHOR (GAUZE/BANDAGES/DRESSINGS) ×5 IMPLANT
STAPLER VISISTAT 35W (STAPLE) ×5 IMPLANT
STOCKINETTE IMPERVIOUS 9X36 MD (GAUZE/BANDAGES/DRESSINGS) ×5 IMPLANT
STOCKINETTE IMPERVIOUS LG (DRAPES) ×5 IMPLANT
STRIP CLOSURE SKIN 1/2X4 (GAUZE/BANDAGES/DRESSINGS) ×4 IMPLANT
SUCTION FRAZIER HANDLE 10FR (MISCELLANEOUS) ×4
SUCTION TUBE FRAZIER 10FR DISP (MISCELLANEOUS) ×6 IMPLANT
SUT ETHILON 2 0 PSLX (SUTURE) ×25 IMPLANT
SUT ETHILON 3 0 PS 1 (SUTURE) ×10 IMPLANT
SUT FIBERWIRE #2 38 T-5 BLUE (SUTURE) ×15
SUT PDS AB 0 CT 36 (SUTURE) ×5 IMPLANT
SUT PDS AB 2-0 CT1 27 (SUTURE) ×10 IMPLANT
SUT PROLENE 0 CT 2 (SUTURE) ×10 IMPLANT
SUT VIC AB 0 CT1 27 (SUTURE) ×6
SUT VIC AB 0 CT1 27XBRD ANBCTR (SUTURE) ×9 IMPLANT
SUT VIC AB 1 CT1 18XCR BRD 8 (SUTURE) ×6 IMPLANT
SUT VIC AB 1 CT1 27 (SUTURE) ×2
SUT VIC AB 1 CT1 27XBRD ANBCTR (SUTURE) ×3 IMPLANT
SUT VIC AB 1 CT1 8-18 (SUTURE) ×4
SUT VIC AB 2-0 CT1 27 (SUTURE) ×10
SUT VIC AB 2-0 CT1 TAPERPNT 27 (SUTURE) ×15 IMPLANT
SUTURE FIBERWR #2 38 T-5 BLUE (SUTURE) ×9 IMPLANT
SYR 20ML ECCENTRIC (SYRINGE) IMPLANT
SYR CONTROL 10ML LL (SYRINGE) IMPLANT
TOWEL OR 17X24 6PK STRL BLUE (TOWEL DISPOSABLE) ×10 IMPLANT
TOWEL OR 17X26 10 PK STRL BLUE (TOWEL DISPOSABLE) ×10 IMPLANT
TRAY FOLEY CATH 16FRSI W/METER (SET/KITS/TRAYS/PACK) IMPLANT
TUBE ANAEROBIC SPECIMEN COL (MISCELLANEOUS) IMPLANT
TUBE CONNECTING 12'X1/4 (SUCTIONS) ×1
TUBE CONNECTING 12X1/4 (SUCTIONS) ×4 IMPLANT
UNDERPAD 30X30 INCONTINENT (UNDERPADS AND DIAPERS) ×5 IMPLANT
WATER STERILE IRR 1000ML POUR (IV SOLUTION) ×10 IMPLANT
YANKAUER SUCT BULB TIP NO VENT (SUCTIONS) ×5 IMPLANT

## 2015-07-03 NOTE — Brief Op Note (Signed)
06/28/2015 - 07/03/2015  5:24 PM  PATIENT:  [name]  25 y.o. female  PRE-OPERATIVE DIAGNOSIS:  Open grade 3A Left Tibia fracture, Left Femur fracture, Left Bicondylar Tibial plateau fracture, Open Tibial eminence fracture, Open Tibial shaft fracture, Left Anterior column and posterior wall acetabular fracture with intraarticular debris, Retained external fixator  POST-OPERATIVE DIAGNOSIS:  Open grade 3A Left Tibia fracture, Left Femur fracture, Left Tibial plateau fracture, Left Acetabular fracture, Retained external fixator  PROCEDURE:  Procedure(s): 1. IRRIGATION AND DEBRIDEMENT LEFT OPEN TIBIA (Left) WITH REMOVAL OF BONE 2. INTRAMEDULLARY (IM) NAIL FEMORAL (Left) WITH PHONEIX BIOMET RETROGRADE 420 X 10.5MM NAIL 3. OPEN REDUCTION INTERNAL FIXATION (ORIF) OPEN BICONDYLAR TIBIAL PLATEAU (Left) 4. ORIF OPEN TIBIAL EMINENCE 5. ORIF OPEN TIBIAL SHAFT 6. OPEN REDUCTION INTERNAL FIXATION (ORIF) ACETABULAR FRACTURE (N/A), POSTERIOR WALL  7. REMOVAL OF EXTERNAL FIXATION LEFT LEG (Left) 8. APPLICATION OF SMALL WOUND VAC 9. STRESS FLOURO OF LEFT KNEE JOINT  SURGEON:  Surgeon(s) and Role:    * Myrene GalasMichael Anastasio Wogan, MD - Primary  PHYSICIAN ASSISTANT: Montez MoritaKeith Paul, [name]  ANESTHESIA:   general  I/O:  Total I/O In: 3000 [I.V.:3000] Out: 1635 [Urine:1385; Blood:250]  SPECIMEN:  No Specimen  TOURNIQUET:  * No tourniquets in log *  DICTATION: tba

## 2015-07-03 NOTE — Transfer of Care (Signed)
Immediate Anesthesia Transfer of Care Note  Patient: Margart SicklesJasmine Densmore  Procedure(s) Performed: Procedure(s): IRRIGATION AND DEBRIDEMENT LEFT OPEN TIBIA (Left) INTRAMEDULLARY (IM) NAIL FEMORAL (Left) OPEN REDUCTION INTERNAL FIXATION (ORIF) TIBIAL PLATEAU (Left) OPEN REDUCTION INTERNAL FIXATION (ORIF) ACETABULAR FRACTURE (N/A) REMOVAL OF EXTERNAL FIXATION LEFT LEG (Left)  Patient Location: PACU  Anesthesia Type:General  Level of Consciousness: sedated and Patient remains intubated per anesthesia plan  Airway & Oxygen Therapy: Patient remains intubated per anesthesia plan and Patient placed on Ventilator (see vital sign flow sheet for setting)  Post-op Assessment: Report given to RN and Post -op Vital signs reviewed and stable  Post vital signs: Reviewed and stable  Last Vitals:  Filed Vitals:   07/03/15 0600 07/03/15 0700  BP: 123/79 115/81  Pulse: 108 106  Temp: 37 C   Resp: 24 30    Last Pain:  Filed Vitals:   07/03/15 0716  PainSc: Asleep         Complications: No apparent anesthesia complications

## 2015-07-03 NOTE — Progress Notes (Signed)
OT Sign off Note  Patient Details Name: [name] MRN: [id number] DOB: Jun 12, 1990   Cancelled Treatment:    Reason Eval/Treat Not Completed: Patient not medically ready (neck traction, pending surg 6/9) MD please reorder when appropriate.   Felecia ShellingJones, Koralynn Greenspan B   Tarena Gockley, Brynn   OTR/L Pager: [phone] Office: [phone] .  07/03/2015, 7:01 AM

## 2015-07-03 NOTE — Anesthesia Preprocedure Evaluation (Addendum)
Anesthesia Evaluation  Patient identified by MRN, date of birth, ID band Patient awake    Reviewed: Allergy & Precautions, NPO status , Patient's Chart, lab work & pertinent test results  Airway Mallampati: III   Neck ROM: Limited    Dental  (+) Teeth Intact   Pulmonary Current Smoker,     + decreased breath sounds      Cardiovascular  Rhythm:Regular Rate:Normal     Neuro/Psych    GI/Hepatic   Endo/Other    Renal/GU      Musculoskeletal   Abdominal   Peds  Hematology   Anesthesia Other Findings   Reproductive/Obstetrics                            Anesthesia Physical Anesthesia Plan  ASA: III  Anesthesia Plan: General   Post-op Pain Management:    Induction: Intravenous  Airway Management Planned: Oral ETT  Additional Equipment: Arterial line and CVP  Intra-op Plan:   Post-operative Plan: Possible Post-op intubation/ventilation  Informed Consent: I have reviewed the patients History and Physical, chart, labs and discussed the procedure including the risks, benefits and alternatives for the proposed anesthesia with the patient or authorized representative who has indicated his/her understanding and acceptance.     Plan Discussed with: CRNA and Anesthesiologist  Anesthesia Plan Comments:        Anesthesia Quick Evaluation

## 2015-07-03 NOTE — Anesthesia Procedure Notes (Signed)
Procedure Name: Intubation Date/Time: 07/03/2015 8:37 AM Performed by: Adonis HousekeeperNGELL, Kailany Dinunzio M Pre-anesthesia Checklist: Patient identified, Emergency Drugs available, Suction available and Patient being monitored Patient Re-evaluated:Patient Re-evaluated prior to inductionOxygen Delivery Method: Circle system utilized Preoxygenation: Pre-oxygenation with 100% oxygen Intubation Type: IV induction and Rapid sequence Laryngoscope Size: Glidescope and 3 Grade View: Grade I Tube type: Oral Tube size: 7.0 mm Number of attempts: 1 Airway Equipment and Method: Stylet and Video-laryngoscopy (elective glidescope; pt in c-collar) Placement Confirmation: ETT inserted through vocal cords under direct vision,  positive ETCO2 and breath sounds checked- equal and bilateral Secured at: 21 cm Tube secured with: Tape Dental Injury: Teeth and Oropharynx as per pre-operative assessment
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
## 2015-07-04 LAB — POCT I-STAT 4, (NA,K, GLUC, HGB,HCT)
GLUCOSE: 108 mg/dL — AB (ref 65–99)
HEMATOCRIT: 30 % — AB (ref 36.0–46.0)
Hemoglobin: 10.2 g/dL — ABNORMAL LOW (ref 12.0–15.0)
Potassium: 4.6 mmol/L (ref 3.5–5.1)
SODIUM: 136 mmol/L (ref 135–145)

## 2015-07-04 LAB — CBC
HCT: 28.3 % — ABNORMAL LOW (ref 36.0–46.0)
HEMOGLOBIN: 9 g/dL — AB (ref 12.0–15.0)
MCH: 27.5 pg (ref 26.0–34.0)
MCHC: 31.8 g/dL (ref 30.0–36.0)
MCV: 86.5 fL (ref 78.0–100.0)
Platelets: 179 10*3/uL (ref 150–400)
RBC: 3.27 MIL/uL — AB (ref 3.87–5.11)
RDW: 17.3 % — ABNORMAL HIGH (ref 11.5–15.5)
WBC: 12.1 10*3/uL — ABNORMAL HIGH (ref 4.0–10.5)

## 2015-07-04 MED ORDER — ALBUTEROL SULFATE (2.5 MG/3ML) 0.083% IN NEBU: 2.5000 mg | INHALATION_SOLUTION | RESPIRATORY_TRACT | Status: DC | PRN

## 2015-07-04 MED ORDER — CHLORHEXIDINE GLUCONATE 0.12% ORAL RINSE (MEDLINE KIT)
15.0000 mL | Freq: Two times a day (BID) | OROMUCOSAL | Status: DC
  Administered 2015-07-04 (×2): 15 mL via OROMUCOSAL

## 2015-07-04 MED ORDER — OXYCODONE HCL 5 MG PO TABS
10.0000 mg | ORAL_TABLET | ORAL | Status: DC | PRN
  Administered 2015-07-04 – 2015-07-08 (×13): 20 mg via ORAL
  Administered 2015-07-08: 10 mg via ORAL
  Filled 2015-07-04 (×9): qty 4
  Filled 2015-07-04: qty 2
  Filled 2015-07-04 (×4): qty 4

## 2015-07-04 MED ORDER — CETYLPYRIDINIUM CHLORIDE 0.05 % MT LIQD
7.0000 mL | Freq: Two times a day (BID) | OROMUCOSAL | Status: DC
  Administered 2015-07-04 – 2015-07-06 (×4): 7 mL via OROMUCOSAL

## 2015-07-04 MED ORDER — ANTISEPTIC ORAL RINSE SOLUTION (CORINZ)
7.0000 mL | OROMUCOSAL | Status: DC
  Administered 2015-07-04 (×5): 7 mL via OROMUCOSAL

## 2015-07-04 MED ORDER — IPRATROPIUM-ALBUTEROL 0.5-2.5 (3) MG/3ML IN SOLN
3.0000 mL | Freq: Three times a day (TID) | RESPIRATORY_TRACT | Status: DC
  Administered 2015-07-04 – 2015-07-05 (×2): 3 mL via RESPIRATORY_TRACT
  Filled 2015-07-04 (×2): qty 3

## 2015-07-04 NOTE — Progress Notes (Signed)
Patient ID: [name], female   DOB: [birth year], 25 y.o.   MRN: [id number] BP 116/64 mmHg  Pulse 121  Temp(Src) 100 F (37.8 C) (Core (Comment))  Resp 16  Ht 5\' 8"  (1.727 m)  Wt 99.7 kg (219 lb 12.8 oz)  BMI 33.43 kg/m2  SpO2 98%  LMP  Lethargic, just extubated Awaiting flex ex films. Leave in collar until films.

## 2015-07-04 NOTE — Anesthesia Postprocedure Evaluation (Signed)
Anesthesia Post Note  Patient: Margart SicklesJasmine Trevor  Procedure(s) Performed: Procedure(s) (LRB): IRRIGATION AND DEBRIDEMENT LEFT OPEN TIBIA (Left) INTRAMEDULLARY (IM) NAIL FEMORAL (Left) OPEN REDUCTION INTERNAL FIXATION (ORIF) TIBIAL PLATEAU (Left) OPEN REDUCTION INTERNAL FIXATION (ORIF) ACETABULAR FRACTURE (N/A) REMOVAL OF EXTERNAL FIXATION LEFT LEG (Left)  Patient location during evaluation: SICU Anesthesia Type: General Level of consciousness: sedated Pain management: pain level controlled Vital Signs Assessment: post-procedure vital signs reviewed and stable Respiratory status: patient remains intubated per anesthesia plan Cardiovascular status: stable Anesthetic complications: no    Last Vitals:  Filed Vitals:   07/04/15 0700 07/04/15 0800  BP: 123/80 125/75  Pulse: 113 126  Temp: 37.1 C 37.1 C  Resp: 23 16    Last Pain:  Filed Vitals:   07/04/15 0807  PainSc: Asleep                 Jiayi Lengacher DAVID

## 2015-07-04 NOTE — Procedures (Signed)
Extubation Procedure Note  Patient Details:   Name: [name] DOB: 01/16/1991 MRN: [id number]   Airway Documentation:  Airway 7.5 mm (Active)  Secured at (cm) 21 cm 07/04/2015  7:50 AM  Measured From Lips 07/04/2015  7:50 AM  Secured Location Right 07/04/2015  4:01 AM  Secured By [employer] Tube Holder 07/04/2015  4:01 AM  Tube Holder Repositioned Yes 07/04/2015  4:01 AM  Cuff Pressure (cm H2O) 28 cm H2O 07/04/2015  4:01 AM  Site Condition Dry 07/04/2015  4:01 AM    Evaluation  O2 sats: stable throughout Complications: No apparent complications Patient did tolerate procedure well. Bilateral Breath Sounds: Clear, Diminished   Yes  Pt extubated to a 4lpm Rogers. Pt tolerating well at this time.   Melanee Spryelson, Iasiah Ozment Lawson 07/04/2015, 8:18 AM
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
## 2015-07-05 ENCOUNTER — Inpatient Hospital Stay (HOSPITAL_COMMUNITY): Payer: BLUE CROSS/BLUE SHIELD

## 2015-07-05 LAB — BASIC METABOLIC PANEL
Anion gap: 7 (ref 5–15)
BUN: 6 mg/dL (ref 6–20)
CALCIUM: 8.5 mg/dL — AB (ref 8.9–10.3)
CHLORIDE: 101 mmol/L (ref 101–111)
CO2: 27 mmol/L (ref 22–32)
CREATININE: 0.77 mg/dL (ref 0.44–1.00)
GFR calc non Af Amer: >60 mL/min (ref >60)
Glucose, Bld: 105 mg/dL — ABNORMAL HIGH (ref 65–99)
Potassium: 3.9 mmol/L (ref 3.5–5.1)
SODIUM: 135 mmol/L (ref 135–145)

## 2015-07-05 LAB — ANAEROBIC CULTURE

## 2015-07-05 LAB — CBC
HCT: 25.8 % — ABNORMAL LOW (ref 36.0–46.0)
HEMOGLOBIN: 8.2 g/dL — AB (ref 12.0–15.0)
MCH: 28.7 pg (ref 26.0–34.0)
MCHC: 31.8 g/dL (ref 30.0–36.0)
MCV: 90.2 fL (ref 78.0–100.0)
Platelets: 289 10*3/uL (ref 150–400)
RBC: 2.86 MIL/uL — ABNORMAL LOW (ref 3.87–5.11)
RDW: 17.5 % — AB (ref 11.5–15.5)
WBC: 16.2 10*3/uL — ABNORMAL HIGH (ref 4.0–10.5)

## 2015-07-05 MED ORDER — BISACODYL 5 MG PO TBEC
10.0000 mg | DELAYED_RELEASE_TABLET | Freq: Every day | ORAL | Status: DC | PRN
  Administered 2015-07-05: 10 mg via ORAL
  Filled 2015-07-05: qty 2

## 2015-07-05 MED ORDER — IPRATROPIUM-ALBUTEROL 0.5-2.5 (3) MG/3ML IN SOLN
3.0000 mL | Freq: Two times a day (BID) | RESPIRATORY_TRACT | Status: DC
  Administered 2015-07-05 – 2015-07-06 (×2): 3 mL via RESPIRATORY_TRACT
  Filled 2015-07-05 (×2): qty 3

## 2015-07-05 MED ORDER — DEXTROSE 5 % IV SOLN
1000.0000 mg | Freq: Three times a day (TID) | INTRAVENOUS | Status: DC | PRN
  Administered 2015-07-05 (×2): 1000 mg via INTRAVENOUS
  Filled 2015-07-05 (×5): qty 10

## 2015-07-05 NOTE — Progress Notes (Addendum)
Patient ID: Elizabeth SicklesJasmine Dyer, female   DOB: October 01, 1990, 25 y.o.   MRN: [id number] 2 Days Post-Op  Subjective: C/O pain LLE  Objective: Vital signs in last 24 hours: Temp:  [99.3 F (37.4 C)-100.8 F (38.2 C)] 99.7 F (37.6 C) (06/11 0900) Pulse Rate:  [111-132] 121 (06/11 0900) Resp:  [14-35] 23 (06/11 0900) BP: (99-123)/(60-91) 116/65 mmHg (06/11 0900) SpO2:  [96 %-100 %] 96 % (06/11 0900) Last BM Date:  (PTA)  Intake/Output from previous day: 06/10 0701 - 06/11 0700 In: 2532.7 [P.O.:240; I.V.:1742.7; IV Piggyback:550] Out: 2440 [Urine:2440] Intake/Output this shift: Total I/O In: 320.5 [P.O.:120; I.V.:150.5; IV Piggyback:50] Out: 490 [Urine:490]  General appearance: cooperative Resp: clear to auscultation bilaterally Cardio: regular rate and rhythm GI: soft but distended, a few BS Extremities: dressing changed R forearm abrasion - clean, LLE ortho dressings  Lab Results: CBC   Recent Labs  07/04/15 0203 07/05/15 0506  WBC 12.1* 16.2*  HGB 9.0* 8.2*  HCT 28.3* 25.8*  PLT 179 289   BMET  Recent Labs  07/03/15 0431 07/03/15 1635 07/05/15 0506  NA 135 136 135  K 4.1 4.6 3.9  CL 107  --  101  CO2 23  --  27  GLUCOSE 109* 108* 105*  BUN 8  --  6  CREATININE 0.89  --  0.77  CALCIUM 8.5*  --  8.5*    Anti-infectives: Anti-infectives    Start     Dose/Rate Route Frequency Ordered Stop   07/03/15 2200  ceFAZolin (ANCEF) IVPB 2g/100 mL premix     2 g 200 mL/hr over 30 Minutes Intravenous Every 8 hours 07/03/15 1756     06/30/15 1645  gentamicin (GARAMYCIN) 520 mg in dextrose 5 % 100 mL IVPB     520 mg 113 mL/hr over 60 Minutes Intravenous Every 24 hours 06/30/15 1638 07/06/15 1644   06/30/15 1500  gentamicin (GARAMYCIN) 520 mg in dextrose 5 % 100 mL IVPB  Status:  Discontinued     520 mg 113 mL/hr over 60 Minutes Intravenous Every 24 hours 06/30/15 0249 06/30/15 1638   06/30/15 1055  vancomycin (VANCOCIN) powder  Status:  Discontinued       As needed  06/30/15 1104 06/30/15 1419   06/30/15 1051  gentamicin (GARAMYCIN) injection  Status:  Discontinued       As needed 06/30/15 1055 06/30/15 1419   06/29/15 1400  ceFAZolin (ANCEF) IVPB 2g/100 mL premix  Status:  Discontinued     2 g 200 mL/hr over 30 Minutes Intravenous Every 8 hours 06/29/15 1238 07/03/15 1756   06/29/15 1400  gentamicin (GARAMYCIN) 520 mg in dextrose 5 % 100 mL IVPB     520 mg 113 mL/hr over 60 Minutes Intravenous  Once 06/29/15 1309 06/29/15 1634   06/28/15 1200  ceFAZolin (ANCEF) IVPB 2g/100 mL premix  Status:  Discontinued     2 g 200 mL/hr over 30 Minutes Intravenous Every 6 hours 06/28/15 1155 06/28/15 1211   06/28/15 1200  ceFAZolin (ANCEF) IVPB 1 g/50 mL premix  Status:  Discontinued     1 g 100 mL/hr over 30 Minutes Intravenous Every 8 hours 06/28/15 1055 06/28/15 1211   06/28/15 0430  ceFAZolin (ANCEF) IVPB 1 g/50 mL premix     1 g 100 mL/hr over 30 Minutes Intravenous  Once 06/28/15 0420 06/28/15 0520      Assessment/Plan: PHBC Nasal FX - per Dr. Suszanne Connerseoh C7 TVP FX - per Dr. Wynetta Emeryram, collar for ligamentous injury Foreign body  in esophagus - tongue ring connector, was in distal esophagus. Should pass. R 1,2, 4-10 and L 1,3,5 rib FX and pulm contusions/B occult PTX - pulm toilet Vent dependent resp failure - weaning to extubate L2 TVP FX Pelvic FX with R obturator ring and L acetabulum and sacral ala with SI diastasis s/p SI screws and anterior wall repair  - status post ORIF by Dr. Carola Frost, XRT Monday per Dr. Carola Frost to prevent heterotopic ossification Left femur fx s/p ex fix - status post ORIF now by Dr. Carola Frost L tibial plateau and fibula FX - S/P ORIF by Dr. Carola Frost Left ankle fx/dislocation s/p ORIF -- S/P post ORIF by Dr. Carola Frost ABL anemia - down a bit, follow ID - antibiotic coverage per orthopedics for open fracture FEN - mild ileus, clears, add Robaxin VTE - SCD's, Lovenox DIspo - possible SDU tomorrow, therapies I spoke with her parents at the bedside   LOS: 7 days    [name], MD, MPH, FACS Trauma: [phone] General Surgery: [phone]  07/05/2015

## 2015-07-05 NOTE — Progress Notes (Signed)
Patient ID: Margart SicklesJasmine Dyer, female   DOB: 1990-11-24, 24 y.o.   MRN: [id number] BP 114/72 mmHg  Pulse 114  Temp(Src) 100.2 F (37.9 C) (Core (Comment))  Resp 21  Ht 5\' 8"  (1.727 m)  Wt 99.7 kg (219 lb 12.8 oz)  BMI 33.43 kg/m2  SpO2 99%  LMP  Alert, oriented, following commands Moving extremities c collar in place Complaining of pain

## 2015-07-06 ENCOUNTER — Encounter: Payer: Self-pay | Admitting: *Deleted

## 2015-07-06 ENCOUNTER — Encounter (HOSPITAL_COMMUNITY): Payer: Self-pay | Admitting: Orthopedic Surgery

## 2015-07-06 ENCOUNTER — Telehealth: Payer: Self-pay | Admitting: *Deleted

## 2015-07-06 ENCOUNTER — Ambulatory Visit
Admit: 2015-07-06 | Discharge: 2015-07-06 | Disposition: A | Discharge status: Home / Self Care | Payer: BLUE CROSS/BLUE SHIELD | Attending: Radiation Oncology | Admitting: Radiation Oncology

## 2015-07-06 DIAGNOSIS — M9689 Other intraoperative and postprocedural complications and disorders of the musculoskeletal system: Secondary | ICD-10-CM

## 2015-07-06 DIAGNOSIS — S32402A Unspecified fracture of left acetabulum, initial encounter for closed fracture: Secondary | ICD-10-CM | POA: Insufficient documentation

## 2015-07-06 DIAGNOSIS — M6140 Other calcification of muscle, unspecified site: Secondary | ICD-10-CM

## 2015-07-06 LAB — CBC
HEMATOCRIT: 26.1 % — AB (ref 36.0–46.0)
Hemoglobin: 8.3 g/dL — ABNORMAL LOW (ref 12.0–15.0)
MCH: 27.9 pg (ref 26.0–34.0)
MCHC: 31.8 g/dL (ref 30.0–36.0)
MCV: 87.9 fL (ref 78.0–100.0)
PLATELETS: 392 10*3/uL (ref 150–400)
RBC: 2.97 MIL/uL — AB (ref 3.87–5.11)
RDW: 17.1 % — AB (ref 11.5–15.5)
WBC: 15.1 10*3/uL — ABNORMAL HIGH (ref 4.0–10.5)

## 2015-07-06 NOTE — Progress Notes (Signed)
   Pre-Radiation Transport Note:  IP nurse called:  Time:  07/06/2015,12:55 PM  IV location:  Picc line  IV fluids: Yes.    D% 1/2 +20kcl , also Central line  Last dose of pain medication given (name/time): Fentanyl by [facility], [facility]  Sherrill Mckamie, Gloriann LoanJanice Bruner, RN 07/06/2015,12:55 PM

## 2015-07-06 NOTE — Telephone Encounter (Signed)
Called [facility] at [phone] spoke with Tammy, dispatcher for [facility] ,  Have called trauma neuro and also spoke with RN Fulton MoleAlice, to have [facility] transport patient to our dept  To have here by 1145am 7:24 AM

## 2015-07-06 NOTE — Progress Notes (Signed)
   Pre-Radiation Note:spoke with Fulton [facility]Alice [facility]  Inpatient nurse name: Fulton MoleAlice RN  Time Called: 0735am Inpatient nurse to call [facility]: Yes.    [facility] called to verify transportation: Yes.   Time: 0745am  Patient Status:   Pain: generalized all over Pain medication given: 3[facility] 3-C  Mobility Orders:bed Treatment Site:right pelvis Additional Injuries: facial  fxs, right rib fxs, left tib/orif with wound vac  Consent:    Is patient able to sign consent: No. Family member called: Yes  Name/Time:   Father  Lowella PettiesMcElroy, Lacee Grey Bruner, RN 07/06/2015,12:51 PM
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
## 2015-07-06 NOTE — Telephone Encounter (Signed)
Called [facility] for bed 14-c ,spoke with RN Fulton MoleAlice, patient has D51/2NS +20kcl infusing, has a pic line and an IV site, she is aware that she needs to call [facility]  [facility] for patient's pelvic fracture, she will medicate patient for pain before coming to the cancer center, She stated no PCA pump, they are trying to get patient's pain under control, thanked RN for the information 6:51 AM

## 2015-07-06 NOTE — Telephone Encounter (Signed)
error 

## 2015-07-06 NOTE — Progress Notes (Signed)
[facility] Progress Note  3 Days Post-Op  Subjective: had some trouble with pain control past couple days. Pt reports decreased pain today with addition of robaxin. Pain is 5/10 and mostly in the LLE. Denies any new complaints overnight. Tolerating clears. Having flatus. Denies BM - tried yesterday but was unable to defecate because her pain was not controlled.  [facility]. CXR yesterday - worsening edema, developing opacity in R base. Encouraged deep breathing/IS.   Lines: - Left central line - Right PICC - Foley: 24h output 3,460 cc  Objective: Vital signs in last 24 hours: Temp:  [99.1 F (37.3 C)-101.5 F (38.6 C)] 99.1 F (37.3 C) (06/12 0624) Pulse Rate:  [109-131] 109 (06/12 0700) Resp:  [13-25] 19 (06/12 0700) BP: (101-128)/(57-80) 104/67 mmHg (06/12 0700) SpO2:  [95 %-100 %] 99 % (06/12 0700) Last BM Date:  (PTA)  Intake/Output from previous day: 06/11 0701 - 06/12 0700 In: 2176.5 [P.O.:240; I.V.:1250.5; IV Piggyback:686] Out: 3460 [Urine:3460] Intake/Output this shift:    PE: Gen:  Alert, C-collar in place, nasal cannula on 2L Card:  Tachycardic 112 bpm, regular rhythm, no M/G/R heard, radial pulses 2+BL Pulm: Moran in place, splinted breathing, CTABL, no W/R/R appreciated. Abd: Soft, mildly distended, NT, +BS in 4 quadrants. GU: foley in place Ext:  RUE abrasions -dressings in place, LLE in ortho dressing; Right pedal pulse 2+  Lab Results:   Recent Labs  07/05/15 0506 07/06/15 0453  WBC 16.2* 15.1*  HGB 8.2* 8.3*  HCT 25.8* 26.1*  PLT 289 392   BMET  Recent Labs  07/03/15 1635 07/05/15 0506  NA 136 135  K 4.6 3.9  CL  --  101  CO2  --  27  GLUCOSE 108* 105*  BUN  --  6  CREATININE  --  0.77  CALCIUM  --  8.5*   PT/INR No results for input(s): LABPROT, INR in the last 72 hours. CMP     Component Value Date/Time   NA 135 07/05/2015 0506   K 3.9 07/05/2015 0506   CL 101  07/05/2015 0506   CO2 27 07/05/2015 0506   GLUCOSE 105* 07/05/2015 0506   BUN 6 07/05/2015 0506   CREATININE 0.77 07/05/2015 0506   CALCIUM 8.5* 07/05/2015 0506   PROT 6.6 07/03/2015 0431   ALBUMIN 2.9* 07/03/2015 0431   AST 111* 07/03/2015 0431   ALT 98* 07/03/2015 0431   ALKPHOS 66 07/03/2015 0431   BILITOT 2.2* 07/03/2015 0431   GFRNONAA >60 07/05/2015 0506   GFRAA >60 07/05/2015 0506   Lipase  No results found for: LIPASE  Studies/Results: Dg Chest Port 1 View  07/05/2015  CLINICAL DATA:  Right pneumothorax. EXAM: PORTABLE CHEST 1 VIEW COMPARISON:  July 03, 2015 FINDINGS: The study is limited due to the low volume portable technique. The right PICC line terminates in the right side of the heart. The tip is difficult to see on this study due to poor penetration. However, based on the previous study, the tip was 2.8 cm below the caval atrial junction. The left central line is stable. No pneumothorax. The cardiomediastinal silhouette is stable. Pulmonary edema is more prominent in the interval. There is no more focal opacity in the right base. IMPRESSION: 1. Stable support apparatus. The right PICC line terminates in the right side of the heart an the patient may benefit from withdrawing at 2.8 cm. 2. Mildly worsened edema. 3. Developing focal opacity in the  right base. Recommend follow-up to resolution. Electronically Signed   By: Gerome Sam III M.D   On: 07/05/2015 07:46    Anti-infectives: Anti-infectives    Start     Dose/Rate Route Frequency Ordered Stop   07/03/15 2200  ceFAZolin (ANCEF) IVPB 2g/100 mL premix     2 g 200 mL/hr over 30 Minutes Intravenous Every 8 hours 07/03/15 1756     06/30/15 1645  gentamicin (GARAMYCIN) 520 mg in dextrose 5 % 100 mL IVPB     520 mg 113 mL/hr over 60 Minutes Intravenous Every 24 hours 06/30/15 1638 07/06/15 1644   06/30/15 1500  gentamicin (GARAMYCIN) 520 mg in dextrose 5 % 100 mL IVPB  Status:  Discontinued     520 mg 113 mL/hr over 60  Minutes Intravenous Every 24 hours 06/30/15 0249 06/30/15 1638   06/30/15 1055  vancomycin (VANCOCIN) powder  Status:  Discontinued       As needed 06/30/15 1104 06/30/15 1419   06/30/15 1051  gentamicin (GARAMYCIN) injection  Status:  Discontinued       As needed 06/30/15 1055 06/30/15 1419   06/29/15 1400  ceFAZolin (ANCEF) IVPB 2g/100 mL premix  Status:  Discontinued     2 g 200 mL/hr over 30 Minutes Intravenous Every 8 hours 06/29/15 1238 07/03/15 1756   06/29/15 1400  gentamicin (GARAMYCIN) 520 mg in dextrose 5 % 100 mL IVPB     520 mg 113 mL/hr over 60 Minutes Intravenous  Once 06/29/15 1309 06/29/15 1634   06/28/15 1200  ceFAZolin (ANCEF) IVPB 2g/100 mL premix  Status:  Discontinued     2 g 200 mL/hr over 30 Minutes Intravenous Every 6 hours 06/28/15 1155 06/28/15 1211   06/28/15 1200  ceFAZolin (ANCEF) IVPB 1 g/50 mL premix  Status:  Discontinued     1 g 100 mL/hr over 30 Minutes Intravenous Every 8 hours 06/28/15 1055 06/28/15 1211   06/28/15 0430  ceFAZolin (ANCEF) IVPB 1 g/50 mL premix     1 g 100 mL/hr over 30 Minutes Intravenous  Once 06/28/15 0420 06/28/15 0520       Assessment/Plan PHBC  Nasal FX - per Dr. Suszanne Conners C7 TVP FX - per Dr. Wynetta Emery, collar for ligamentous injury Foreign body in esophagus - tongue ring connector, was in distal esophagus. Should pass. R 1,2, 4-10 and L 1,3,5 rib FX and pulm contusions/B occult PTX - pulm toilet, duo-nebs Vent dependent resp failure - weaning to extubate L2 TVP FX Pelvic FX with R obturator ring and L acetabulum and sacral ala with SI diastasis s/p SI screws and anterior wall repair - s/p ORIF by Dr. Carola Frost, XRT today to prevent heterotopic ossification Left femur fx s/p ex fix - s/p ORIF by Dr. Carola Frost L tibial plateau and fibula FX - S/P ORIF by Dr. Carola Frost Left ankle fx/dislocation s/p ORIF -- S/P post ORIF by Dr. Carola Frost  ABL anemia - hgb 8.3 today from 8.2 yesterday ID - Ancef, Gentamycin; antibiotic coverage per orthopedics for  open fracture; WBC 15.1 from 16.2 yesterday FEN - mild ileus, advance to full liquids, Robaxin, Ducolax VTE - SCD's, Lovenox PT: Non Weight Bearing (NWB) LLE, WBAT RLE for transfers Dispo - SDU, therapies   LOS: 8 days    Adam Phenix , [facility] Surgery 07/06/2015, 8:10 AM Pager: [phone] Mon-Fri 7:00 am-4:30 pm Sat-Sun 7:00 am-11:30 am
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
## 2015-07-06 NOTE — Telephone Encounter (Signed)
[facility] called, at 1133am,  female ,  Stating patient really not wanting to come for treatment today, she has a wound vac on left leg,  But we will be treating the right side hip/pelvis, dad will come with the patient  as patient is medicated for pain and almost somnolent, notified Irving BurtonEmily RT therapist  11:45 AM

## 2015-07-07 ENCOUNTER — Encounter (HOSPITAL_COMMUNITY): Payer: Self-pay | Admitting: Orthopedic Surgery

## 2015-07-07 LAB — CBC
HEMATOCRIT: 26 % — AB (ref 36.0–46.0)
Hemoglobin: 8.3 g/dL — ABNORMAL LOW (ref 12.0–15.0)
MCH: 27.8 pg (ref 26.0–34.0)
MCHC: 31.9 g/dL (ref 30.0–36.0)
MCV: 87 fL (ref 78.0–100.0)
PLATELETS: 461 10*3/uL — AB (ref 150–400)
RBC: 2.99 MIL/uL — ABNORMAL LOW (ref 3.87–5.11)
RDW: 16.5 % — AB (ref 11.5–15.5)
WBC: 14.3 10*3/uL — AB (ref 4.0–10.5)

## 2015-07-07 LAB — TYPE AND SCREEN
ABO/RH(D): A POS
Antibody Screen: NEGATIVE
UNIT DIVISION: 0
UNIT DIVISION: 0

## 2015-07-07 LAB — BASIC METABOLIC PANEL
ANION GAP: 9 (ref 5–15)
BUN: 8 mg/dL (ref 6–20)
CALCIUM: 9 mg/dL (ref 8.9–10.3)
CO2: 28 mmol/L (ref 22–32)
CREATININE: 0.78 mg/dL (ref 0.44–1.00)
Chloride: 98 mmol/L — ABNORMAL LOW (ref 101–111)
GFR calc Af Amer: >60 mL/min (ref >60)
GFR calc non Af Amer: >60 mL/min (ref >60)
Glucose, Bld: 106 mg/dL — ABNORMAL HIGH (ref 65–99)
POTASSIUM: 3.7 mmol/L (ref 3.5–5.1)
Sodium: 135 mmol/L (ref 135–145)

## 2015-07-07 LAB — MAGNESIUM: Magnesium: 2 mg/dL (ref 1.7–2.4)

## 2015-07-07 MED ORDER — DOCUSATE SODIUM 100 MG PO CAPS
100.0000 mg | ORAL_CAPSULE | Freq: Two times a day (BID) | ORAL | Status: DC
  Administered 2015-07-07 – 2015-07-14 (×15): 100 mg via ORAL
  Filled 2015-07-07 (×15): qty 1

## 2015-07-07 MED ORDER — TRAMADOL HCL 50 MG PO TABS
100.0000 mg | ORAL_TABLET | Freq: Four times a day (QID) | ORAL | Status: DC
  Administered 2015-07-07 – 2015-07-14 (×29): 100 mg via ORAL
  Filled 2015-07-07 (×31): qty 2

## 2015-07-07 MED ORDER — METHOCARBAMOL 500 MG PO TABS
1000.0000 mg | ORAL_TABLET | Freq: Four times a day (QID) | ORAL | Status: DC | PRN
  Administered 2015-07-07 – 2015-07-09 (×4): 1000 mg via ORAL
  Filled 2015-07-07 (×4): qty 2

## 2015-07-07 NOTE — Progress Notes (Signed)
Patient ID: Elizabeth SicklesJasmine Dyer, female   DOB: 26-May-1990, 25 y.o.   MRN: [id number]   LOS: 9 days   Subjective: Doing ok, tolerated fulls but would like to stick with that for now.   Objective: Vital signs in last 24 hours: Temp:  [97.9 F (36.6 C)-98.9 F (37.2 C)] 97.9 F (36.6 C) (06/13 0400) Pulse Rate:  [108-130] 111 (06/13 0400) Resp:  [17-24] 22 (06/13 0400) BP: (102-139)/(67-83) 119/71 mmHg (06/13 0400) SpO2:  [89 %-100 %] 100 % (06/13 0400) Weight:  [86.9 kg (191 lb 9.3 oz)] 86.9 kg (191 lb 9.3 oz) (06/12 2100) Last BM Date:  (PTA)   Laboratory  CBC  Recent Labs  07/06/15 0453 07/07/15 0521  WBC 15.1* 14.3*  HGB 8.3* 8.3*  HCT 26.1* 26.0*  PLT 392 461*   BMET  Recent Labs  07/05/15 0506 07/07/15 0521  NA 135 135  K 3.9 3.7  CL 101 98*  CO2 27 28  GLUCOSE 105* 106*  BUN 6 8  CREATININE 0.77 0.78  CALCIUM 8.5* 9.0    Physical Exam General appearance: alert and no distress Resp: clear to auscultation bilaterally Cardio: Tachycardia GI: Soft, diminished BS, NT Extremities: NVI   Assessment/Plan: PHBC Nasal FX - per Dr. Suszanne Connerseoh C7 TVP FX - per Dr. Wynetta Emeryram, collar for ligamentous injury Foreign body in esophagus - tongue ring connector, was in distal esophagus. Should pass. R 1,2, 4-10 and L 1,3,5 rib FX and pulm contusions/B occult PTX - pulm toilet, duo-nebs L2 TVP FX Pelvic FX with R obturator ring and L acetabulum and sacral ala with SI diastasis s/p SI screws and anterior wall repair - s/p ORIF by Dr. Carola FrostHandy, XRT, WBAT for transfers only Left femur fx s/p ex fix - s/p ORIF by Dr. Carola FrostHandy, NWB L tibial plateau and fibula FX - S/P ORIF by Dr. Carola FrostHandy Left ankle fx/dislocation s/p ORIF -- S/P post ORIF by Dr. Carola FrostHandy ABL anemia - Stable FEN - D/C foley, add tramadol for pain, suspect will need long-acting narc VTE - SCD's, Lovenox Dispo - To floor, PT/OT    Freeman CaldronMichael J. Shahara Hartsfield, [name] Pager: [phone] [facility] Pager: [phone]  07/07/2015

## 2015-07-07 NOTE — Progress Notes (Addendum)
07/07/2015 Left Double IJ was removed by Joyce GrossKay (RN) at 1400 catheter was intact and patient tolerate well. [facility]ame[facility] Whan RN.
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
## 2015-07-07 NOTE — Progress Notes (Signed)
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 fractures. EXAM: DG C-ARM GT 120 MIN; LEFT ANKLE COMPLETE - 3+ VIEW; LEFT FEMUR 2 VIEWS; LEFT TIBIA AND FIBULA - 2 VIEW FLUOROSCOPY TIME:  Fluoroscopy Time (in minutes and seconds): 2 minutes 56 seconds Number of Acquired Images:  9 COMPARISON:  06/28/2015 radiographs FINDINGS: Intraoperative spot these are submitted postoperatively for interpretation. External fixation noted traversing comminuted fractures of the proximal-mid left femur, proximal tibial diaphysis, and proximal fibular diaphysis. Degree of alignment and displacement appear relatively unchanged with off fractures. A medial malleolar fracture is faintly visualized and relatively unchanged. IMPRESSION: External fixation of femoral, tibial  and fibular fractures as described. Electronically Signed   By: Harmon Pier M.D.   On: 06/28/2015 11:14   Dg Femur Port Min 2 Views Left  06/28/2015  CLINICAL DATA:  External fixator placement. EXAM: LEFT FEMUR PORTABLE 2 VIEWS COMPARISON:  06/28/2015 FINDINGS: Placement of external fixator device across the comminuted left midshaft femoral fracture. Fracture fragments remain mildly displaced. Partially imaged are comminuted  fractures through the proximal left tibia and fibular shafts. IMPRESSION: External fixation across the midshaft left femoral fracture with continued mild displacement of fracture fragments. Proximal left tibial and fibular shaft fractures partially imaged. Electronically Signed   By: Charlett Nose M.D.   On: 06/28/2015 12:20   Ct Maxillofacial Wo Cm  06/28/2015  CLINICAL DATA:  Level 1 trauma. EXAM: CT HEAD WITHOUT CONTRAST CT MAXILLOFACIAL WITHOUT CONTRAST CT CERVICAL SPINE WITHOUT CONTRAST TECHNIQUE: Multidetector CT imaging of the head, cervical spine, and maxillofacial structures were performed using the standard protocol without intravenous contrast. Multiplanar CT image reconstructions of the cervical spine and maxillofacial structures were also generated. COMPARISON:  None. FINDINGS: CT HEAD FINDINGS Skull and Sinuses:Negative for fracture or hemo sinus. Visualized orbits: Negative. Brain: Negative. No evidence of hemorrhage, swelling, infarct, or hydrocephalus. CT MAXILLOFACIAL FINDINGS Acute bilateral nasal arch fractures with depression and mild leftward deviation. Segmental fracturing of the nasal septum which is bowed to the right. No evidence of orbito-ethmoid continuation. No evidence of globe injury or postseptal hematoma. Located mandible. Notable cavity of the left upper terminal molar. No hemo sinus. Patchy inflammatory mucosal thickening, most notably at the effaced right frontal ethmoidal recess. CT CERVICAL SPINE FINDINGS Nondisplaced C7 right transverse process fracture.  No evidence of traumatic malalignment or cervical canal hematoma. No prevertebral thickening. Bilateral rib fractures, airspace opacity, and apical pneumothorax are discussed on dedicated CT. There is a rounded metallic foreign body at the level of the lower pharynx, not typical of a tooth crown and indeterminate. IMPRESSION: 1. No evidence of intracranial injury. 2. Nondisplaced C7 right transverse process fracture. 3. Depressed bilateral nasal arch and septum fractures. 4. 6 mm metallic foreign body at the level of the oropharynx. Electronically Signed   By: Marnee Spring M.D.   On: 06/28/2015 04:56   Dg Knee 2 Views Left  07/03/2015  CLINICAL DATA:  Status post intra medullary nail fixation of left femoral fracture. EXAM: LEFT FEMUR 2 VIEWS; LEFT KNEE - 3 VIEW; DG C-ARM GT 120 MIN COMPARISON:  None. FINDINGS: Multiple spot fluoroscopic images demonstrate open-reduction internal-fixation of left femoral fracture. Intra medullary rod within the femur is seen without evidence of hardware fracture. Butterfly fragment is seen. Alignment is near anatomic. Side plate is screw fixation of proximal tibial fracture is also seen. There is a comminuted proximal fibular fracture. IMPRESSION: Status post intra medullary nail fixation of comminuted left femoral fracture and side plate and screw fixation of comminuted left tibial fracture. No evidence of immediate complications. Near anatomic alignment. Comminuted proximal left fibular fracture. Electronically Signed   By: Ted Mcalpine M.D.   On: 07/03/2015 13:23       IMPRESSION:  The patient has been diagnosed with a acetabular fracture of the left hip. The patient is a good candidate for one fraction of postoperative radiation treatment for the prevention of the development of heterotopic ossification.  I have discussed the rationale of this treatment with the patient. I have discussed the possible/expected benefit of such a treatment. I have also discussed  the possible side effects and risks of treatment as well. All of the patient's questions have been answered.   PLAN: The patient will undergo simulation and one fraction of external beam radiation treatment. This will be completed to a dose of 7 Gy. This treatment will be completed on postoperative day #3.       ________________________________   Radene Gunning, MD, PhD   **Disclaimer: This note was dictated with voice  recognition software. Similar sounding words can inadvertently be transcribed and this note may contain transcription errors which may not have been corrected upon publication of note.**

## 2015-07-07 NOTE — Progress Notes (Signed)
07/07/2015 Nurse tech reported patient had a incontinent void at 1800. [facility] St[facility].

## 2015-07-07 NOTE — Progress Notes (Signed)
Rehab Admissions Coordinator Note:  Patient was screened by Clois DupesBoyette, Skylah Delauter Godwin for appropriateness for an Inpatient Acute Rehab Consult per PT recommendation.  At this time, we are recommending await further progress with therapy before requesting Rehab consult. Pain limiting more therapy at this time. I will follow.Clois Dupes.  Dia Jefferys Godwin 07/07/2015, 12:02 PM  I can be reached at [phone].
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
## 2015-07-07 NOTE — Progress Notes (Signed)
07/07/2015 Foley cath removed at 1300. She had 300cc of amber urine, some odor noted and tolerated it well. [facility] Crosby RN.

## 2015-07-08 LAB — CBC
HCT: 26.2 % — ABNORMAL LOW (ref 36.0–46.0)
HEMOGLOBIN: 8.3 g/dL — AB (ref 12.0–15.0)
MCH: 28 pg (ref 26.0–34.0)
MCHC: 31.7 g/dL (ref 30.0–36.0)
MCV: 88.5 fL (ref 78.0–100.0)
PLATELETS: 506 10*3/uL — AB (ref 150–400)
RBC: 2.96 MIL/uL — AB (ref 3.87–5.11)
RDW: 16.2 % — ABNORMAL HIGH (ref 11.5–15.5)
WBC: 14.8 10*3/uL — ABNORMAL HIGH (ref 4.0–10.5)

## 2015-07-08 MED ORDER — MAGNESIUM CITRATE PO SOLN
0.5000 | Freq: Once | ORAL | Status: AC
  Administered 2015-07-08: 0.5 via ORAL
  Filled 2015-07-08: qty 296
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
## 2015-07-08 NOTE — Progress Notes (Signed)
Pt continues to make good progress with PT/OT, though limited by pain and increased HR.  Will plan hopeful dc to CIR when pain and HR better controlled.    Quintella BatonJulie W. Cheyane Ayon, RN, BSN  Trauma/Neuro ICU Case Manager [phone]

## 2015-07-08 NOTE — Progress Notes (Signed)
Patient ID: [name], female   DOB: 10/05/1990, 25 y.o.   MRN: [id number] 5 Days Post-Op  Subjective: Pain control a little better, passing gas but no BM last 2d  Objective: Vital signs in last 24 hours: Temp:  [98 F (36.7 C)-99.3 F (37.4 C)] 98 F (36.7 C) (06/14 0431) Pulse Rate:  [100-122] 111 (06/14 0431) Resp:  [20-28] 24 (06/14 0431) BP: (115-129)/(72-82) 119/76 mmHg (06/14 0431) SpO2:  [95 %-99 %] 99 % (06/14 0431) Last BM Date:  (PTA)  Intake/Output from previous day: 06/13 0701 - 06/14 0700 In: 290 [P.O.:240; IV Piggyback:50] Out: 650 [Urine:600; Drains:50] Intake/Output this shift:    General appearance: cooperative Neck: collar Resp: clear to auscultation bilaterally Cardio: RRR tachy 110 after sitting up GI: soft, less dist, +BS, NT Extremities: toes warm, ortho dressings  Lab Results: CBC   Recent Labs  07/07/15 0521 07/08/15 0500  WBC 14.3* 14.8*  HGB 8.3* 8.3*  HCT 26.0* 26.2*  PLT 461* 506*   BMET  Recent Labs  07/07/15 0521  NA 135  K 3.7  CL 98*  CO2 28  GLUCOSE 106*  BUN 8  CREATININE 0.78  CALCIUM 9.0   PT/INR No results for input(s): LABPROT, INR in the last 72 hours. ABG No results for input(s): PHART, HCO3 in the last 72 hours.  Invalid input(s): PCO2, PO2  Studies/Results: No results found.  Anti-infectives: Anti-infectives    Start     Dose/Rate Route Frequency Ordered Stop   07/03/15 2200  ceFAZolin (ANCEF) IVPB 2g/100 mL premix  Status:  Discontinued     2 g 200 mL/hr over 30 Minutes Intravenous Every 8 hours 07/03/15 1756 07/06/15 0912   06/30/15 1645  gentamicin (GARAMYCIN) 520 mg in dextrose 5 % 100 mL IVPB  Status:  Discontinued     520 mg 113 mL/hr over 60 Minutes Intravenous Every 24 hours 06/30/15 1638 07/06/15 0912   06/30/15 1500  gentamicin (GARAMYCIN) 520 mg in dextrose 5 % 100 mL IVPB  Status:  Discontinued     520 mg 113 mL/hr over 60 Minutes Intravenous Every 24 hours 06/30/15 0249 06/30/15  1638   06/30/15 1055  vancomycin (VANCOCIN) powder  Status:  Discontinued       As needed 06/30/15 1104 06/30/15 1419   06/30/15 1051  gentamicin (GARAMYCIN) injection  Status:  Discontinued       As needed 06/30/15 1055 06/30/15 1419   06/29/15 1400  ceFAZolin (ANCEF) IVPB 2g/100 mL premix  Status:  Discontinued     2 g 200 mL/hr over 30 Minutes Intravenous Every 8 hours 06/29/15 1238 07/03/15 1756   06/29/15 1400  gentamicin (GARAMYCIN) 520 mg in dextrose 5 % 100 mL IVPB     520 mg 113 mL/hr over 60 Minutes Intravenous  Once 06/29/15 1309 06/29/15 1634   06/28/15 1200  ceFAZolin (ANCEF) IVPB 2g/100 mL premix  Status:  Discontinued     2 g 200 mL/hr over 30 Minutes Intravenous Every 6 hours 06/28/15 1155 06/28/15 1211   06/28/15 1200  ceFAZolin (ANCEF) IVPB 1 g/50 mL premix  Status:  Discontinued     1 g 100 mL/hr over 30 Minutes Intravenous Every 8 hours 06/28/15 1055 06/28/15 1211   06/28/15 0430  ceFAZolin (ANCEF) IVPB 1 g/50 mL premix     1 g 100 mL/hr over 30 Minutes Intravenous  Once 06/28/15 0420 06/28/15 0520      Assessment/Plan: PHBC Nasal FX - per Dr. Suszanne Connerseoh C7 TVP FX -  per Dr. Wynetta Emery, collar for ligamentous injury Foreign body in esophagus - tongue ring connector, was in distal esophagus. Should pass. R 1,2, 4-10 and L 1,3,5 rib FX and pulm contusions/B occult PTX - pulm toilet, duo-nebs L2 TVP FX Pelvic FX with R obturator ring and L acetabulum and sacral ala with SI diastasis s/p SI screws and anterior wall repair - s/p ORIF by Dr. Carola Frost, XRT, NWB LLE, WBAT RLE for transfers only Left femur fx s/p ex fix - s/p ORIF by Dr. Carola Frost, NWB L tibial plateau and fibula FX - S/P ORIF by Dr. Carola Frost Left ankle fx/dislocation s/p ORIF -- S/P post ORIF by Dr. Carola Frost ABL anemia - Stable FEN - mag citrate, advance diet, may need long-acting narc but will see how today goes VTE - SCD's, Lovenox Dispo - To floor, PT/OT  LOS: 10 days    [name], MD, MPH, FACS Trauma:  [phone] General Surgery: [phone]  07/08/2015

## 2015-07-09 DIAGNOSIS — S32811A Multiple fractures of pelvis with unstable disruption of pelvic ring, initial encounter for closed fracture: Secondary | ICD-10-CM

## 2015-07-09 DIAGNOSIS — S32008A Other fracture of unspecified lumbar vertebra, initial encounter for closed fracture: Secondary | ICD-10-CM

## 2015-07-09 DIAGNOSIS — S069X1A Unspecified intracranial injury with loss of consciousness of 30 minutes or less, initial encounter: Secondary | ICD-10-CM

## 2015-07-09 DIAGNOSIS — S9305XA Dislocation of left ankle joint, initial encounter: Secondary | ICD-10-CM

## 2015-07-09 MED ORDER — MAGNESIUM CITRATE PO SOLN
1.0000 | Freq: Once | ORAL | Status: DC
  Filled 2015-07-09 (×3): qty 296

## 2015-07-09 MED ORDER — OXYCODONE HCL 5 MG PO TABS
10.0000 mg | ORAL_TABLET | ORAL | Status: DC | PRN
  Administered 2015-07-10: 20 mg via ORAL
  Filled 2015-07-09: qty 4

## 2015-07-09 MED ORDER — ACETAMINOPHEN 500 MG PO TABS
1000.0000 mg | ORAL_TABLET | Freq: Four times a day (QID) | ORAL | Status: DC
  Administered 2015-07-09 – 2015-07-14 (×20): 1000 mg via ORAL
  Filled 2015-07-09 (×22): qty 2

## 2015-07-09 MED ORDER — COUMADIN BOOK
Freq: Once | Status: DC
  Filled 2015-07-09: qty 1

## 2015-07-09 MED ORDER — WARFARIN VIDEO: Freq: Once | Status: DC

## 2015-07-09 MED ORDER — WARFARIN - PHARMACIST DOSING INPATIENT
Freq: Every day | Status: DC
  Administered 2015-07-13 – 2015-07-14 (×2)

## 2015-07-09 MED ORDER — METHOCARBAMOL 500 MG PO TABS
1000.0000 mg | ORAL_TABLET | Freq: Four times a day (QID) | ORAL | Status: DC
  Administered 2015-07-09 – 2015-07-14 (×22): 1000 mg via ORAL
  Filled 2015-07-09 (×21): qty 2

## 2015-07-09 MED ORDER — MAGNESIUM HYDROXIDE 400 MG/5ML PO SUSP
15.0000 mL | Freq: Every day | ORAL | Status: DC
  Administered 2015-07-09: 15 mL via ORAL
  Filled 2015-07-09 (×6): qty 30

## 2015-07-09 MED ORDER — WARFARIN SODIUM 5 MG PO TABS
5.0000 mg | ORAL_TABLET | Freq: Once | ORAL | Status: AC
  Administered 2015-07-09: 5 mg via ORAL
  Filled 2015-07-09: qty 1

## 2015-07-09 MED ORDER — OXYCODONE HCL ER 20 MG PO T12A: 20.0000 mg | EXTENDED_RELEASE_TABLET | Freq: Two times a day (BID) | ORAL | Status: DC

## 2015-07-09 MED ORDER — HYDROMORPHONE HCL 1 MG/ML IJ SOLN
0.5000 mg | INTRAMUSCULAR | Status: DC | PRN
  Administered 2015-07-09 – 2015-07-13 (×2): 0.5 mg via INTRAVENOUS
  Filled 2015-07-09 (×2): qty 1

## 2015-07-09 NOTE — Progress Notes (Signed)
[facility] Progress Note  6 Days Post-Op  Subjective: Pt laying in bed, tired. Did not sleep well due to pain. Complains of constant pain in her right knee that is aching/throbbing in nature. Tolerating PO. Urinating in bed pan. Having flatus - no BM in 3 days. Pulling 750 on IS.   Movement and PT participation limited by tachycardia and pain.  HR @ rest during my exam: 104 BPM.   Objective: Vital signs in last 24 hours: Temp:  [98.3 F (36.8 C)-99.4 F (37.4 C)] 99 F (37.2 C) (06/15 0455) Pulse Rate:  [83-119] 110 (06/15 0455) Resp:  [16-20] 20 (06/15 0455) BP: (121-128)/(57-75) 128/64 mmHg (06/15 0455) SpO2:  [97 %-100 %] 97 % (06/15 0455) Last BM Date: 06/28/15  Intake/Output from previous day: 06/14 0701 - 06/15 0700 In: 70 [I.V.:70] Out: 250 [Urine:200; Drains:50] Intake/Output this shift:    PE: General appearance: cooperative Neck: collar Resp: unlabored, CTABL Cardio: tachycardic, regular rhythm, no m/r/g appreciated GI: soft, less dist, +BS, NT Extremities: RUE abrasions -dressings in place, LLE in ortho dressing, toes warm; Right pedal pulse 2+ Lab Results:   Recent Labs  07/07/15 0521 07/08/15 0500  WBC 14.3* 14.8*  HGB 8.3* 8.3*  HCT 26.0* 26.2*  PLT 461* 506*   BMET  Recent Labs  07/07/15 0521  NA 135  K 3.7  CL 98*  CO2 28  GLUCOSE 106*  BUN 8  CREATININE 0.78  CALCIUM 9.0   PT/INR No results for input(s): LABPROT, INR in the last 72 hours. CMP     Component Value Date/Time   NA 135 07/07/2015 0521   K 3.7 07/07/2015 0521   CL 98* 07/07/2015 0521   CO2 28 07/07/2015 0521   GLUCOSE 106* 07/07/2015 0521   BUN 8 07/07/2015 0521   CREATININE 0.78 07/07/2015 0521   CALCIUM 9.0 07/07/2015 0521   PROT 6.6 07/03/2015 0431   ALBUMIN 2.9* 07/03/2015 0431   AST 111* 07/03/2015 0431   ALT 98* 07/03/2015 0431   ALKPHOS 66 07/03/2015 0431   BILITOT 2.2* 07/03/2015 0431   GFRNONAA >60 07/07/2015 0521   GFRAA >60 07/07/2015  0521   Lipase  No results found for: LIPASE     Studies/Results: No results found.  Anti-infectives: Anti-infectives    Start     Dose/Rate Route Frequency Ordered Stop   07/03/15 2200  ceFAZolin (ANCEF) IVPB 2g/100 mL premix  Status:  Discontinued     2 g 200 mL/hr over 30 Minutes Intravenous Every 8 hours 07/03/15 1756 07/06/15 0912   06/30/15 1645  gentamicin (GARAMYCIN) 520 mg in dextrose 5 % 100 mL IVPB  Status:  Discontinued     520 mg 113 mL/hr over 60 Minutes Intravenous Every 24 hours 06/30/15 1638 07/06/15 0912   06/30/15 1500  gentamicin (GARAMYCIN) 520 mg in dextrose 5 % 100 mL IVPB  Status:  Discontinued     520 mg 113 mL/hr over 60 Minutes Intravenous Every 24 hours 06/30/15 0249 06/30/15 1638   06/30/15 1055  vancomycin (VANCOCIN) powder  Status:  Discontinued       As needed 06/30/15 1104 06/30/15 1419   06/30/15 1051  gentamicin (GARAMYCIN) injection  Status:  Discontinued       As needed 06/30/15 1055 06/30/15 1419   06/29/15 1400  ceFAZolin (ANCEF) IVPB 2g/100 mL premix  Status:  Discontinued     2 g 200 mL/hr over 30 Minutes Intravenous Every 8 hours 06/29/15 1238 07/03/15 1756   06/29/15  1400  gentamicin (GARAMYCIN) 520 mg in dextrose 5 % 100 mL IVPB     520 mg 113 mL/hr over 60 Minutes Intravenous  Once 06/29/15 1309 06/29/15 1634   06/28/15 1200  ceFAZolin (ANCEF) IVPB 2g/100 mL premix  Status:  Discontinued     2 g 200 mL/hr over 30 Minutes Intravenous Every 6 hours 06/28/15 1155 06/28/15 1211   06/28/15 1200  ceFAZolin (ANCEF) IVPB 1 g/50 mL premix  Status:  Discontinued     1 g 100 mL/hr over 30 Minutes Intravenous Every 8 hours 06/28/15 1055 06/28/15 1211   06/28/15 0430  ceFAZolin (ANCEF) IVPB 1 g/50 mL premix     1 g 100 mL/hr over 30 Minutes Intravenous  Once 06/28/15 0420 06/28/15 0520       Assessment/Plan PHBC Nasal FX - per Dr. Suszanne Conners C7 TVP FX - per Dr. Wynetta Emery, collar for ligamentous injury Foreign body in esophagus - tongue ring  connector, was in distal esophagus. Should pass. R 1,2, 4-10 and L 1,3,5 rib FX and pulm contusions/B occult PTX - pulm toilet, duo-nebs L2 TVP FX Pelvic FX with R obturator ring and L acetabulum and sacral ala with SI diastasis s/p SI screws and anterior wall repair - s/p ORIF by Dr. Carola Frost, XRT, NWB LLE, WBAT RLE for transfers only Left femur fx s/p ex fix - s/p ORIF by Dr. Carola Frost, NWB L tibial plateau and fibula FX - S/P ORIF by Dr. Carola Frost Left ankle fx/dislocation s/p ORIF -- S/P post ORIF by Dr. Carola Frost ABL anemia - Stable  FEN - regular diet VTE - SCD's, Lovenox Dispo - increase pain control, PT/OT, CIR when medically ready started long acting narcotic (oxycontin 20-40 mg BID, scheduled) pain not controlled with addition of tramadol on 6/13   LOS: 11 days    Adam Phenix , [facility] 07/09/2015, 8:07 AM Pager: [phone] Mon-Fri 7:00 am-4:30 pm Sat-Sun 7:00 am-11:30 am

## 2015-07-09 NOTE — Progress Notes (Signed)
Orthopedic Tech Progress Note Patient Details:  [name] 22-Apr-1990 [id number]  Ortho Devices Type of Ortho Device:  (footsie roll) Ortho Device/Splint Location: lle Ortho Device/Splint Interventions: Application   [name] 07/09/2015, 10:35 AM
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
## 2015-07-10 LAB — PROTIME-INR
INR: 1.29 (ref 0.00–1.49)
PROTHROMBIN TIME: 16.2 s — AB (ref 11.6–15.2)

## 2015-07-10 MED ORDER — OXYCODONE HCL ER 20 MG PO T12A
20.0000 mg | EXTENDED_RELEASE_TABLET | Freq: Two times a day (BID) | ORAL | Status: DC
  Administered 2015-07-10 – 2015-07-14 (×9): 20 mg via ORAL
  Filled 2015-07-10 (×9): qty 1

## 2015-07-10 MED ORDER — OXYCODONE HCL 5 MG PO TABS
10.0000 mg | ORAL_TABLET | ORAL | Status: DC | PRN
  Administered 2015-07-11 – 2015-07-14 (×6): 20 mg via ORAL
  Filled 2015-07-10: qty 2
  Filled 2015-07-10: qty 4
  Filled 2015-07-10: qty 2
  Filled 2015-07-10 (×5): qty 4

## 2015-07-10 MED ORDER — WARFARIN SODIUM 5 MG PO TABS
5.0000 mg | ORAL_TABLET | Freq: Once | ORAL | Status: AC
  Administered 2015-07-10: 5 mg via ORAL
  Filled 2015-07-10: qty 1

## 2015-07-10 NOTE — Progress Notes (Signed)
PT Cancellation Note  Patient Details Name: Gray BernhardtJasmine XXXcole MRN: [id number] DOB: 04/24/1990   Cancelled Treatment:    Reason Eval/Treat Not Completed: Fatigue/lethargy limiting ability to participate.  Pt lethargic and difficult to arouse.  Noted pt given IV pain meds ~1 hr prior to PT arrival.  Will try another time.     Sunny SchleinRitenour, Caine Barfield F, South CarolinaPT [phone] 07/10/2015, 11:18 AM

## 2015-07-10 NOTE — Progress Notes (Signed)
[facility] Progress Note  7 Days Post-Op  Subjective: Sitting up in bed, tearful this morning because she is "tired", tired of being in the hospital, tired of being in pain. Wants to go home. Urinating and had a BM yesterday. Tolerating PO. States that she doesn't feel like she needs IV pain meds much during the day but has trouble sleeping at night without the morphine.   Objective: Vital signs in last 24 hours: Temp:  [98.3 F (36.8 C)-99 F (37.2 C)] 98.3 F (36.8 C) (06/16 0600) Pulse Rate:  [109-112] 109 (06/16 0600) Resp:  [20] 20 (06/16 0600) BP: (121-127)/(59-75) 121/75 mmHg (06/16 0600) SpO2:  [93 %-97 %] 93 % (06/16 0600) Last BM Date: 07/09/15  Intake/Output from previous day: 06/15 0701 - 06/16 0700 In: 490 [P.O.:480; I.V.:10] Out: -  Intake/Output this shift:    PE: General appearance: cooperative, restless, tearful Neck: collar Resp: unlabored, CTABL Cardio: tachycardic but improving, regular rhythm, no m/r/g appreciated GI: soft, less distended, +BS, NT Extremities: RUE abrasions -dressings in place, LLE in ortho dressing, toes warm; Right pedal pulse 2+  Lab Results:   Recent Labs  07/08/15 0500  WBC 14.8*  HGB 8.3*  HCT 26.2*  PLT 506*   BMET No results for input(s): NA, K, CL, CO2, GLUCOSE, BUN, CREATININE, CALCIUM in the last 72 hours. PT/INR  Recent Labs  07/10/15 0420  LABPROT 16.2*  INR 1.29   CMP     Component Value Date/Time   NA 135 07/07/2015 0521   K 3.7 07/07/2015 0521   CL 98* 07/07/2015 0521   CO2 28 07/07/2015 0521   GLUCOSE 106* 07/07/2015 0521   BUN 8 07/07/2015 0521   CREATININE 0.78 07/07/2015 0521   CALCIUM 9.0 07/07/2015 0521   PROT 6.6 07/03/2015 0431   ALBUMIN 2.9* 07/03/2015 0431   AST 111* 07/03/2015 0431   ALT 98* 07/03/2015 0431   ALKPHOS 66 07/03/2015 0431   BILITOT 2.2* 07/03/2015 0431   GFRNONAA >60 07/07/2015 0521   GFRAA >60 07/07/2015 0521   Lipase  No results found for:  LIPASE  Studies/Results: No results found.  Anti-infectives: Anti-infectives    Start     Dose/Rate Route Frequency Ordered Stop   07/03/15 2200  ceFAZolin (ANCEF) IVPB 2g/100 mL premix  Status:  Discontinued     2 g 200 mL/hr over 30 Minutes Intravenous Every 8 hours 07/03/15 1756 07/06/15 0912   06/30/15 1645  gentamicin (GARAMYCIN) 520 mg in dextrose 5 % 100 mL IVPB  Status:  Discontinued     520 mg 113 mL/hr over 60 Minutes Intravenous Every 24 hours 06/30/15 1638 07/06/15 0912   06/30/15 1500  gentamicin (GARAMYCIN) 520 mg in dextrose 5 % 100 mL IVPB  Status:  Discontinued     520 mg 113 mL/hr over 60 Minutes Intravenous Every 24 hours 06/30/15 0249 06/30/15 1638   06/30/15 1055  vancomycin (VANCOCIN) powder  Status:  Discontinued       As needed 06/30/15 1104 06/30/15 1419   06/30/15 1051  gentamicin (GARAMYCIN) injection  Status:  Discontinued       As needed 06/30/15 1055 06/30/15 1419   06/29/15 1400  ceFAZolin (ANCEF) IVPB 2g/100 mL premix  Status:  Discontinued     2 g 200 mL/hr over 30 Minutes Intravenous Every 8 hours 06/29/15 1238 07/03/15 1756   06/29/15 1400  gentamicin (GARAMYCIN) 520 mg in dextrose 5 % 100 mL IVPB     520 mg 113 mL/hr  over 60 Minutes Intravenous  Once 06/29/15 1309 06/29/15 1634   06/28/15 1200  ceFAZolin (ANCEF) IVPB 2g/100 mL premix  Status:  Discontinued     2 g 200 mL/hr over 30 Minutes Intravenous Every 6 hours 06/28/15 1155 06/28/15 1211   06/28/15 1200  ceFAZolin (ANCEF) IVPB 1 g/50 mL premix  Status:  Discontinued     1 g 100 mL/hr over 30 Minutes Intravenous Every 8 hours 06/28/15 1055 06/28/15 1211   06/28/15 0430  ceFAZolin (ANCEF) IVPB 1 g/50 mL premix     1 g 100 mL/hr over 30 Minutes Intravenous  Once 06/28/15 0420 06/28/15 0520     Assessment/Plan PHBC Nasal FX - per Dr. Suszanne Connerseoh C7 TVP FX - per Dr. Wynetta Emeryram, collar for ligamentous injury Foreign body in esophagus - tongue ring connector, was in distal esophagus. Should pass. R 1,2,  4-10 and L 1,3,5 rib FX and pulm contusions/B occult PTX - pulm toilet, duo-nebs L2 TVP FX Pelvic FX with R obturator ring and L acetabulum and sacral ala with SI diastasis s/p SI screws and anterior wall repair - s/p ORIF by Dr. Carola FrostHandy, XRT, NWB LLE, WBAT RLE for transfers only Left femur fx s/p ex fix - s/p ORIF by Dr. Carola FrostHandy, NWB L tibial plateau and fibula FX - S/P ORIF by Dr. Carola FrostHandy Left ankle fx/dislocation s/p ORIF -- S/P post ORIF by Dr. Carola FrostHandy ABL anemia - Stable  FEN - regular diet VTE - SCD's, Lovenox Dispo - pain control, PT/OT, CIR when medically ready & off of IV pain meds Addition of oxycontin 20 mg BID - 6/16 addition of tramadol - 6/13  Morphine for breakthrough only!    LOS: 12 days    Adam PhenixElizabeth S Simaan , [facility] 07/10/2015, 9:06 AM Pager: [phone] Mon-Fri 7:00 am-4:30 pm Sat-Sun 7:00 am-11:30 am
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
## 2015-07-10 NOTE — Progress Notes (Signed)
Faxed mother's completed and signed FMLA forms to her employer, per her request.    Quintella BatonJulie W. Shaddai Shapley, RN, BSN  Trauma/Neuro ICU Case Manager [phone]

## 2015-07-10 NOTE — Discharge Instructions (Signed)
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
## 2015-07-10 NOTE — Progress Notes (Signed)
Inpatient Rehabilitation  Attempted to meet with patient to discuss team's recommendation for IP Rehab; however, patient very difficult to arouse.  Noted mom's contact information on board in room and patient was able to mumble that mom was helping her make decisions.  Will plan to follow up with patient's mom, Diane.  Plan to follow along for timing of medical readiness (needs to be off IV pain meds), therapy tolerance, and insurance authorization.  Notified CSW.  Please call with questions.    Charlane FerrettiMelissa Dula Havlik, M.A., CCC/SLP Admission Coordinator  [facility]  Cell [phone]
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
## 2015-07-11 LAB — PROTIME-INR
INR: 1.49 (ref 0.00–1.49)
PROTHROMBIN TIME: 18.1 s — AB (ref 11.6–15.2)

## 2015-07-11 MED ORDER — WARFARIN SODIUM 7.5 MG PO TABS
7.5000 mg | ORAL_TABLET | Freq: Once | ORAL | Status: AC
  Administered 2015-07-11: 7.5 mg via ORAL
  Filled 2015-07-11: qty 1

## 2015-07-11 NOTE — Progress Notes (Signed)
8 Days Post-Op  Subjective: Pt doing well NAE No further IV pain rx given  Objective: Vital signs in last 24 hours: Temp:  [98.1 F (36.7 C)-99 F (37.2 C)] 99 F (37.2 C) (06/16 2002) Pulse Rate:  [95-116] 95 (06/17 0646) Resp:  [17-20] 17 (06/17 0646) BP: (118-124)/(61-76) 118/69 mmHg (06/17 0646) SpO2:  [93 %-100 %] 94 % (06/17 0646) Last BM Date: 07/09/15  Intake/Output from previous day: 06/16 0701 - 06/17 0700 In: 720 [P.O.:710; I.V.:10] Out: -  Intake/Output this shift:    General appearance: alert and cooperative Cardio: regular rate and rhythm, S1, S2 normal, no murmur, click, rub or gallop GI: soft, non-tender; bowel sounds normal; no masses,  no organomegaly  Lab Results:  No results for input(s): WBC, HGB, HCT, PLT in the last 72 hours. BMET No results for input(s): NA, K, CL, CO2, GLUCOSE, BUN, CREATININE, CALCIUM in the last 72 hours. PT/INR  Recent Labs  07/10/15 0420 07/11/15 0415  LABPROT 16.2* 18.1*  INR 1.29 1.49   ABG No results for input(s): PHART, HCO3 in the last 72 hours.  Invalid input(s): PCO2, PO2  Studies/Results: No results found.  Anti-infectives: Anti-infectives    Start     Dose/Rate Route Frequency Ordered Stop   07/03/15 2200  ceFAZolin (ANCEF) IVPB 2g/100 mL premix  Status:  Discontinued     2 g 200 mL/hr over 30 Minutes Intravenous Every 8 hours 07/03/15 1756 07/06/15 0912   06/30/15 1645  gentamicin (GARAMYCIN) 520 mg in dextrose 5 % 100 mL IVPB  Status:  Discontinued     520 mg 113 mL/hr over 60 Minutes Intravenous Every 24 hours 06/30/15 1638 07/06/15 0912   06/30/15 1500  gentamicin (GARAMYCIN) 520 mg in dextrose 5 % 100 mL IVPB  Status:  Discontinued     520 mg 113 mL/hr over 60 Minutes Intravenous Every 24 hours 06/30/15 0249 06/30/15 1638   06/30/15 1055  vancomycin (VANCOCIN) powder  Status:  Discontinued       As needed 06/30/15 1104 06/30/15 1419   06/30/15 1051  gentamicin (GARAMYCIN) injection  Status:   Discontinued       As needed 06/30/15 1055 06/30/15 1419   06/29/15 1400  ceFAZolin (ANCEF) IVPB 2g/100 mL premix  Status:  Discontinued     2 g 200 mL/hr over 30 Minutes Intravenous Every 8 hours 06/29/15 1238 07/03/15 1756   06/29/15 1400  gentamicin (GARAMYCIN) 520 mg in dextrose 5 % 100 mL IVPB     520 mg 113 mL/hr over 60 Minutes Intravenous  Once 06/29/15 1309 06/29/15 1634   06/28/15 1200  ceFAZolin (ANCEF) IVPB 2g/100 mL premix  Status:  Discontinued     2 g 200 mL/hr over 30 Minutes Intravenous Every 6 hours 06/28/15 1155 06/28/15 1211   06/28/15 1200  ceFAZolin (ANCEF) IVPB 1 g/50 mL premix  Status:  Discontinued     1 g 100 mL/hr over 30 Minutes Intravenous Every 8 hours 06/28/15 1055 06/28/15 1211   06/28/15 0430  ceFAZolin (ANCEF) IVPB 1 g/50 mL premix     1 g 100 mL/hr over 30 Minutes Intravenous  Once 06/28/15 0420 06/28/15 0520      Assessment/Plan: PHBC Nasal FX - per Dr. Suszanne Connerseoh C7 TVP FX - per Dr. Wynetta Emeryram, collar for ligamentous injury Foreign body in esophagus - tongue ring connector, was in distal esophagus. Should pass. R 1,2, 4-10 and L 1,3,5 rib FX and pulm contusions/B occult PTX - pulm toilet, duo-nebs L2 TVP  FX Pelvic FX with R obturator ring and L acetabulum and sacral ala with SI diastasis s/p SI screws and anterior wall repair - s/p ORIF by Dr. Carola Frost, XRT, NWB LLE, WBAT RLE for transfers only Left femur fx s/p ex fix - s/p ORIF by Dr. Carola Frost, NWB L tibial plateau and fibula FX - S/P ORIF by Dr. Carola Frost Left ankle fx/dislocation s/p ORIF -- S/P post ORIF by Dr. Carola Frost ABL anemia - Stable FEN - regular diet VTE - SCD's, Lovenox Dispo - Pt off IV pain Rxs and likely ready for CIR   LOS: 13 days    Marigene Ehlers., [facility] 07/11/2015
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
## 2015-07-12 LAB — PROTIME-INR
INR: 1.54 — AB (ref 0.00–1.49)
Prothrombin Time: 18.6 seconds — ABNORMAL HIGH (ref 11.6–15.2)

## 2015-07-12 LAB — CBC
HCT: 27.4 % — ABNORMAL LOW (ref 36.0–46.0)
Hemoglobin: 8.7 g/dL — ABNORMAL LOW (ref 12.0–15.0)
MCH: 27.4 pg (ref 26.0–34.0)
MCHC: 31.8 g/dL (ref 30.0–36.0)
MCV: 86.4 fL (ref 78.0–100.0)
PLATELETS: 586 10*3/uL — AB (ref 150–400)
RBC: 3.17 MIL/uL — AB (ref 3.87–5.11)
RDW: 17 % — AB (ref 11.5–15.5)
WBC: 10.5 10*3/uL (ref 4.0–10.5)

## 2015-07-12 MED ORDER — WARFARIN SODIUM 7.5 MG PO TABS
7.5000 mg | ORAL_TABLET | Freq: Once | ORAL | Status: AC
  Administered 2015-07-12: 7.5 mg via ORAL
  Filled 2015-07-12: qty 1

## 2015-07-12 NOTE — Progress Notes (Signed)
9 Days Post-Op  Subjective: NAE  Objective: Vital signs in last 24 hours: Temp:  [98 F (36.7 C)-98.4 F (36.9 C)] 98.4 F (36.9 C) (06/18 0451) Pulse Rate:  [84-105] 84 (06/18 0451) Resp:  [18] 18 (06/18 0451) BP: (118-129)/(71-80) 118/72 mmHg (06/18 0451) SpO2:  [97 %-100 %] 97 % (06/18 0451) Last BM Date: 07/09/15  Intake/Output from previous day:   Intake/Output this shift:    General appearance: alert and cooperative GI: soft, non-tender; bowel sounds normal; no masses,  no organomegaly  Lab Results:   Recent Labs  07/12/15 0431  WBC 10.5  HGB 8.7*  HCT 27.4*  PLT 586*   BMET No results for input(s): NA, K, CL, CO2, GLUCOSE, BUN, CREATININE, CALCIUM in the last 72 hours. PT/INR  Recent Labs  07/11/15 0415 07/12/15 0431  LABPROT 18.1* 18.6*  INR 1.49 1.54*   ABG No results for input(s): PHART, HCO3 in the last 72 hours.  Invalid input(s): PCO2, PO2  Studies/Results: No results found.  Anti-infectives: Anti-infectives    Start     Dose/Rate Route Frequency Ordered Stop   07/03/15 2200  ceFAZolin (ANCEF) IVPB 2g/100 mL premix  Status:  Discontinued     2 g 200 mL/hr over 30 Minutes Intravenous Every 8 hours 07/03/15 1756 07/06/15 0912   06/30/15 1645  gentamicin (GARAMYCIN) 520 mg in dextrose 5 % 100 mL IVPB  Status:  Discontinued     520 mg 113 mL/hr over 60 Minutes Intravenous Every 24 hours 06/30/15 1638 07/06/15 0912   06/30/15 1500  gentamicin (GARAMYCIN) 520 mg in dextrose 5 % 100 mL IVPB  Status:  Discontinued     520 mg 113 mL/hr over 60 Minutes Intravenous Every 24 hours 06/30/15 0249 06/30/15 1638   06/30/15 1055  vancomycin (VANCOCIN) powder  Status:  Discontinued       As needed 06/30/15 1104 06/30/15 1419   06/30/15 1051  gentamicin (GARAMYCIN) injection  Status:  Discontinued       As needed 06/30/15 1055 06/30/15 1419   06/29/15 1400  ceFAZolin (ANCEF) IVPB 2g/100 mL premix  Status:  Discontinued     2 g 200 mL/hr over 30 Minutes  Intravenous Every 8 hours 06/29/15 1238 07/03/15 1756   06/29/15 1400  gentamicin (GARAMYCIN) 520 mg in dextrose 5 % 100 mL IVPB     520 mg 113 mL/hr over 60 Minutes Intravenous  Once 06/29/15 1309 06/29/15 1634   06/28/15 1200  ceFAZolin (ANCEF) IVPB 2g/100 mL premix  Status:  Discontinued     2 g 200 mL/hr over 30 Minutes Intravenous Every 6 hours 06/28/15 1155 06/28/15 1211   06/28/15 1200  ceFAZolin (ANCEF) IVPB 1 g/50 mL premix  Status:  Discontinued     1 g 100 mL/hr over 30 Minutes Intravenous Every 8 hours 06/28/15 1055 06/28/15 1211   06/28/15 0430  ceFAZolin (ANCEF) IVPB 1 g/50 mL premix     1 g 100 mL/hr over 30 Minutes Intravenous  Once 06/28/15 0420 06/28/15 0520      Assessment/Plan: PHBC Nasal FX - per Dr. Suszanne Conners C7 TVP FX - per Dr. Wynetta Emery, collar for ligamentous injury Foreign body in esophagus - tongue ring connector, was in distal esophagus. Should pass. R 1,2, 4-10 and L 1,3,5 rib FX and pulm contusions/B occult PTX - pulm toilet, duo-nebs L2 TVP FX Pelvic FX with R obturator ring and L acetabulum and sacral ala with SI diastasis s/p SI screws and anterior wall repair - s/p ORIF  by Dr. Carola FrostHandy, XRT, NWB LLE, WBAT RLE for transfers only Left femur fx s/p ex fix - s/p ORIF by Dr. Carola FrostHandy, NWB L tibial plateau and fibula FX - S/P ORIF by Dr. Carola FrostHandy Left ankle fx/dislocation s/p ORIF -- S/P post ORIF by Dr. Carola FrostHandy ABL anemia - Stable FEN - regular diet VTE - SCD's, Lovenox Dispo - Pt off IV pain Rxs, awaiting insurance for CIR  LOS: 14 days    Marigene EhlersRamirez Jr., [facility] 07/12/2015
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
## 2015-07-13 LAB — PROTIME-INR
INR: 2.14 — AB (ref 0.00–1.49)
PROTHROMBIN TIME: 23.7 s — AB (ref 11.6–15.2)

## 2015-07-13 MED ORDER — WARFARIN SODIUM 5 MG PO TABS
2.5000 mg | ORAL_TABLET | Freq: Once | ORAL | Status: AC
  Administered 2015-07-13: 2.5 mg via ORAL
  Filled 2015-07-13: qty 1

## 2015-07-13 MED ORDER — SODIUM CHLORIDE 0.9% FLUSH: 3.0000 mL | INTRAVENOUS | Status: DC | PRN

## 2015-07-13 NOTE — Progress Notes (Addendum)
Continuing to follow for readiness for IP Rehab.  I spoke with pt. to encourage her to fully participate in therapies to the best of her abilities for hopeful insurance approval for CIR. I also discussed with pt's RN Corrie DandyMary that it is recommended that pt. sit up for at least 1-1 1/2 hours at at time to prepare her for being OOB on CIR. Pt. Also needs to be transitioned off IV pain meds.   I plan to initiate insurance authorization once updated therapy notes are available.  Please call if questions.  Weldon PickingSusan Mats Jeanlouis PT Inpatient Rehab Admissions Coordinator Cell [phone] Office [phone]

## 2015-07-13 NOTE — Progress Notes (Signed)
[facility] Progress Note  10 Days Post-Op  Subjective: Laying in bed, mother in the room. reports no new events overnight. Pain is 3/10. Urinating. Having BMs. Mobilizing with PT.  Objective: Vital signs in last 24 hours: Temp:  [97.9 F (36.6 C)-98.4 F (36.9 C)] 98.2 F (36.8 C) (06/19 0344) Pulse Rate:  [100-105] 100 (06/19 0344) Resp:  [16] 16 (06/19 0344) BP: (115-134)/(50-80) 115/63 mmHg (06/19 0344) SpO2:  [97 %-100 %] 97 % (06/19 0344) Last BM Date: 07/13/15 (x2 per report by day RN and by patient)  Intake/Output from previous day: 06/18 0701 - 06/19 0700 In: 520 [P.O.:520] Out: -  Intake/Output this shift:   PE: Gen:  Alert, cooperative, restless - cant find comfortable position. Card:  RRR, no M/G/R heard Pulm:  CTA, no W/R/R Abd: Soft, NT/ND, +BS, no HSM Ext:  Ortho dressing on LLE. Left toes warm. Right pedal pulse 2+  Lab Results:   Recent Labs  07/12/15 0431  WBC 10.5  HGB 8.7*  HCT 27.4*  PLT 586*   BMET No results for input(s): NA, K, CL, CO2, GLUCOSE, BUN, CREATININE, CALCIUM in the last 72 hours. PT/INR  Recent Labs  07/12/15 0431 07/13/15 0430  LABPROT 18.6* 23.7*  INR 1.54* 2.14*   CMP     Component Value Date/Time   NA 135 07/07/2015 0521   K 3.7 07/07/2015 0521   CL 98* 07/07/2015 0521   CO2 28 07/07/2015 0521   GLUCOSE 106* 07/07/2015 0521   BUN 8 07/07/2015 0521   CREATININE 0.78 07/07/2015 0521   CALCIUM 9.0 07/07/2015 0521   PROT 6.6 07/03/2015 0431   ALBUMIN 2.9* 07/03/2015 0431   AST 111* 07/03/2015 0431   ALT 98* 07/03/2015 0431   ALKPHOS 66 07/03/2015 0431   BILITOT 2.2* 07/03/2015 0431   GFRNONAA >60 07/07/2015 0521   GFRAA >60 07/07/2015 0521   Lipase  No results found for: LIPASE     Studies/Results: No results found.  Anti-infectives: Anti-infectives    Start     Dose/Rate Route Frequency Ordered Stop   07/03/15 2200  ceFAZolin (ANCEF) IVPB 2g/100 mL premix  Status:  Discontinued     2 g 200 mL/hr over 30 Minutes Intravenous Every 8 hours 07/03/15 1756 07/06/15 0912   06/30/15 1645  gentamicin (GARAMYCIN) 520 mg in dextrose 5 % 100 mL IVPB  Status:  Discontinued     520 mg 113 mL/hr over 60 Minutes Intravenous Every 24 hours 06/30/15 1638 07/06/15 0912   06/30/15 1500  gentamicin (GARAMYCIN) 520 mg in dextrose 5 % 100 mL IVPB  Status:  Discontinued     520 mg 113 mL/hr over 60 Minutes Intravenous Every 24 hours 06/30/15 0249 06/30/15 1638   06/30/15 1055  vancomycin (VANCOCIN) powder  Status:  Discontinued       As needed 06/30/15 1104 06/30/15 1419   06/30/15 1051  gentamicin (GARAMYCIN) injection  Status:  Discontinued       As needed 06/30/15 1055 06/30/15 1419   06/29/15 1400  ceFAZolin (ANCEF) IVPB 2g/100 mL premix  Status:  Discontinued     2 g 200 mL/hr over 30 Minutes Intravenous Every 8 hours 06/29/15 1238 07/03/15 1756   06/29/15 1400  gentamicin (GARAMYCIN) 520 mg in dextrose 5 % 100 mL IVPB     520 mg 113 mL/hr over 60 Minutes Intravenous  Once 06/29/15 1309 06/29/15 1634   06/28/15 1200  ceFAZolin (ANCEF) IVPB 2g/100 mL premix  Status:  Discontinued  2 g 200 mL/hr over 30 Minutes Intravenous Every 6 hours 06/28/15 1155 06/28/15 1211   06/28/15 1200  ceFAZolin (ANCEF) IVPB 1 g/50 mL premix  Status:  Discontinued     1 g 100 mL/hr over 30 Minutes Intravenous Every 8 hours 06/28/15 1055 06/28/15 1211   06/28/15 0430  ceFAZolin (ANCEF) IVPB 1 g/50 mL premix     1 g 100 mL/hr over 30 Minutes Intravenous  Once 06/28/15 0420 06/28/15 0520      Assessment/Plan PHBC Nasal FX - per Dr. Suszanne Conners C7 TVP FX - per Dr. Wynetta Emery, collar for ligamentous injury Foreign body in esophagus - tongue ring connector, was in distal esophagus. Should pass. R 1,2, 4-10 and L 1,3,5 rib FX and pulm contusions/B occult PTX - pulm toilet, duo-nebs L2 TVP FX Pelvic FX with R obturator ring and L acetabulum and sacral ala with SI diastasis s/p SI screws and anterior wall repair -  s/p ORIF by Dr. Carola Frost, XRT, NWB LLE, WBAT RLE for transfers only Left femur fx s/p ex fix - s/p ORIF by Dr. Carola Frost, NWB L tibial plateau and fibula FX - S/P ORIF by Dr. Carola Frost Left ankle fx/dislocation s/p ORIF -- S/P post ORIF by Dr. Carola Frost ABL anemia - Stable FEN - regular diet VTE - SCD's, Lovenox Dispo - Pt off IV pain Rxs, awaiting placement in CIR    LOS: 15 days    Adam Phenix , [facility] Surgery 07/13/2015, 8:50 AM Pager: [phone] Mon-Fri 7:00 am-4:30 pm Sat-Sun 7:00 am-11:30 am
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
## 2015-07-14 ENCOUNTER — Encounter (HOSPITAL_COMMUNITY): Payer: Self-pay

## 2015-07-14 ENCOUNTER — Inpatient Hospital Stay (HOSPITAL_COMMUNITY)
Admission: RE | Admit: 2015-07-14 | Discharge: 2015-07-24 | DRG: 560 | Disposition: A | Discharge status: Home Health | Payer: BLUE CROSS/BLUE SHIELD | Source: Intra-hospital | Attending: Physical Medicine & Rehabilitation | Admitting: Physical Medicine & Rehabilitation

## 2015-07-14 ENCOUNTER — Encounter (HOSPITAL_COMMUNITY): Payer: Self-pay | Admitting: *Deleted

## 2015-07-14 DIAGNOSIS — S32029D Unspecified fracture of second lumbar vertebra, subsequent encounter for fracture with routine healing: Secondary | ICD-10-CM

## 2015-07-14 DIAGNOSIS — S069X3A Unspecified intracranial injury with loss of consciousness of 1 hour to 5 hours 59 minutes, initial encounter: Secondary | ICD-10-CM | POA: Diagnosis present

## 2015-07-14 DIAGNOSIS — S7292XD Unspecified fracture of left femur, subsequent encounter for closed fracture with routine healing: Secondary | ICD-10-CM

## 2015-07-14 DIAGNOSIS — S82142E Displaced bicondylar fracture of left tibia, subsequent encounter for open fracture type I or II with routine healing: Secondary | ICD-10-CM | POA: Diagnosis not present

## 2015-07-14 DIAGNOSIS — S9305XD Dislocation of left ankle joint, subsequent encounter: Secondary | ICD-10-CM | POA: Diagnosis not present

## 2015-07-14 DIAGNOSIS — S2243XS Multiple fractures of ribs, bilateral, sequela: Secondary | ICD-10-CM | POA: Diagnosis not present

## 2015-07-14 DIAGNOSIS — S32810D Multiple fractures of pelvis with stable disruption of pelvic ring, subsequent encounter for fracture with routine healing: Secondary | ICD-10-CM

## 2015-07-14 DIAGNOSIS — S2249XD Multiple fractures of ribs, unspecified side, subsequent encounter for fracture with routine healing: Secondary | ICD-10-CM | POA: Diagnosis not present

## 2015-07-14 DIAGNOSIS — R609 Edema, unspecified: Secondary | ICD-10-CM

## 2015-07-14 DIAGNOSIS — D62 Acute posthemorrhagic anemia: Secondary | ICD-10-CM | POA: Diagnosis present

## 2015-07-14 DIAGNOSIS — S27322D Contusion of lung, bilateral, subsequent encounter: Secondary | ICD-10-CM | POA: Diagnosis not present

## 2015-07-14 DIAGNOSIS — F4311 Post-traumatic stress disorder, acute: Secondary | ICD-10-CM | POA: Insufficient documentation

## 2015-07-14 DIAGNOSIS — F17200 Nicotine dependence, unspecified, uncomplicated: Secondary | ICD-10-CM | POA: Diagnosis present

## 2015-07-14 DIAGNOSIS — S3282XA Multiple fractures of pelvis without disruption of pelvic ring, initial encounter for closed fracture: Secondary | ICD-10-CM | POA: Diagnosis present

## 2015-07-14 DIAGNOSIS — F4321 Adjustment disorder with depressed mood: Secondary | ICD-10-CM | POA: Diagnosis present

## 2015-07-14 DIAGNOSIS — S32422D Displaced fracture of posterior wall of left acetabulum, subsequent encounter for fracture with routine healing: Secondary | ICD-10-CM | POA: Diagnosis not present

## 2015-07-14 DIAGNOSIS — F1010 Alcohol abuse, uncomplicated: Secondary | ICD-10-CM | POA: Diagnosis present

## 2015-07-14 DIAGNOSIS — S022XXD Fracture of nasal bones, subsequent encounter for fracture with routine healing: Secondary | ICD-10-CM | POA: Diagnosis not present

## 2015-07-14 DIAGNOSIS — F430 Acute stress reaction: Secondary | ICD-10-CM | POA: Diagnosis present

## 2015-07-14 DIAGNOSIS — S069X3D Unspecified intracranial injury with loss of consciousness of 1 hour to 5 hours 59 minutes, subsequent encounter: Secondary | ICD-10-CM

## 2015-07-14 DIAGNOSIS — K567 Ileus, unspecified: Secondary | ICD-10-CM | POA: Diagnosis present

## 2015-07-14 DIAGNOSIS — S8252XD Displaced fracture of medial malleolus of left tibia, subsequent encounter for closed fracture with routine healing: Secondary | ICD-10-CM | POA: Diagnosis not present

## 2015-07-14 DIAGNOSIS — S129XXA Fracture of neck, unspecified, initial encounter: Secondary | ICD-10-CM | POA: Diagnosis present

## 2015-07-14 DIAGNOSIS — IMO0002 Reserved for concepts with insufficient information to code with codable children: Secondary | ICD-10-CM

## 2015-07-14 DIAGNOSIS — S3282XS Multiple fractures of pelvis without disruption of pelvic ring, sequela: Secondary | ICD-10-CM

## 2015-07-14 DIAGNOSIS — S82402D Unspecified fracture of shaft of left fibula, subsequent encounter for closed fracture with routine healing: Secondary | ICD-10-CM

## 2015-07-14 DIAGNOSIS — S82142S Displaced bicondylar fracture of left tibia, sequela: Secondary | ICD-10-CM | POA: Diagnosis not present

## 2015-07-14 DIAGNOSIS — S129XXS Fracture of neck, unspecified, sequela: Secondary | ICD-10-CM

## 2015-07-14 DIAGNOSIS — S069X3S Unspecified intracranial injury with loss of consciousness of 1 hour to 5 hours 59 minutes, sequela: Secondary | ICD-10-CM

## 2015-07-14 DIAGNOSIS — S270XXD Traumatic pneumothorax, subsequent encounter: Secondary | ICD-10-CM

## 2015-07-14 LAB — PROTIME-INR
INR: 1.83 — ABNORMAL HIGH (ref 0.00–1.49)
Prothrombin Time: 21.1 seconds — ABNORMAL HIGH (ref 11.6–15.2)

## 2015-07-14 MED ORDER — METHOCARBAMOL 500 MG PO TABS
500.0000 mg | ORAL_TABLET | Freq: Four times a day (QID) | ORAL | Status: DC | PRN
  Administered 2015-07-14 – 2015-07-15 (×2): 500 mg via ORAL
  Filled 2015-07-14 (×2): qty 1

## 2015-07-14 MED ORDER — WARFARIN SODIUM 7.5 MG PO TABS
7.5000 mg | ORAL_TABLET | Freq: Once | ORAL | Status: AC
  Administered 2015-07-14: 7.5 mg via ORAL
  Filled 2015-07-14: qty 1

## 2015-07-14 MED ORDER — HEPARIN SOD (PORK) LOCK FLUSH 100 UNIT/ML IV SOLN
250.0000 [IU] | INTRAVENOUS | Status: AC | PRN
  Administered 2015-07-14: 250 [IU]

## 2015-07-14 MED ORDER — SODIUM CHLORIDE 0.9% FLUSH
10.0000 mL | INTRAVENOUS | Status: DC | PRN
  Administered 2015-07-15: 10 mL
  Filled 2015-07-14: qty 40

## 2015-07-14 MED ORDER — ONDANSETRON HCL 4 MG PO TABS: 4.0000 mg | ORAL_TABLET | Freq: Four times a day (QID) | ORAL | Status: DC | PRN

## 2015-07-14 MED ORDER — OXYCODONE HCL ER 10 MG PO T12A
20.0000 mg | EXTENDED_RELEASE_TABLET | Freq: Two times a day (BID) | ORAL | Status: DC
  Administered 2015-07-14 – 2015-07-24 (×20): 20 mg via ORAL
  Filled 2015-07-14 (×20): qty 2

## 2015-07-14 MED ORDER — DOCUSATE SODIUM 100 MG PO CAPS
100.0000 mg | ORAL_CAPSULE | Freq: Two times a day (BID) | ORAL | Status: DC
  Administered 2015-07-14 – 2015-07-24 (×20): 100 mg via ORAL
  Filled 2015-07-14 (×20): qty 1

## 2015-07-14 MED ORDER — ONDANSETRON HCL 4 MG/2ML IJ SOLN: 4.0000 mg | Freq: Four times a day (QID) | INTRAMUSCULAR | Status: DC | PRN

## 2015-07-14 MED ORDER — TRAZODONE HCL 50 MG PO TABS
50.0000 mg | ORAL_TABLET | Freq: Every evening | ORAL | Status: DC | PRN
  Administered 2015-07-14 – 2015-07-20 (×3): 50 mg via ORAL
  Filled 2015-07-14 (×3): qty 1

## 2015-07-14 MED ORDER — POLYETHYLENE GLYCOL 3350 17 G PO PACK
17.0000 g | PACK | Freq: Every day | ORAL | Status: DC
Start: 2015-07-15 — End: 2015-07-24
  Administered 2015-07-15 – 2015-07-21 (×6): 17 g via ORAL
  Filled 2015-07-14 (×11): qty 1

## 2015-07-14 MED ORDER — WARFARIN - PHARMACIST DOSING INPATIENT
Freq: Every day | Status: DC
  Administered 2015-07-19 – 2015-07-20 (×2)

## 2015-07-14 MED ORDER — ACETAMINOPHEN 325 MG PO TABS: 325.0000 mg | ORAL_TABLET | ORAL | Status: DC | PRN

## 2015-07-14 MED ORDER — PANTOPRAZOLE SODIUM 40 MG PO TBEC
40.0000 mg | DELAYED_RELEASE_TABLET | Freq: Two times a day (BID) | ORAL | Status: DC
  Administered 2015-07-14 – 2015-07-24 (×20): 40 mg via ORAL
  Filled 2015-07-14 (×21): qty 1

## 2015-07-14 MED ORDER — SORBITOL 70 % SOLN: 30.0000 mL | Freq: Every day | Status: DC | PRN

## 2015-07-14 MED ORDER — ALBUTEROL SULFATE (2.5 MG/3ML) 0.083% IN NEBU: 2.5000 mg | INHALATION_SOLUTION | RESPIRATORY_TRACT | Status: DC | PRN

## 2015-07-14 MED ORDER — TRAMADOL HCL 50 MG PO TABS
100.0000 mg | ORAL_TABLET | Freq: Four times a day (QID) | ORAL | Status: DC
  Administered 2015-07-14 – 2015-07-15 (×2): 100 mg via ORAL
  Filled 2015-07-14 (×2): qty 2

## 2015-07-14 MED ORDER — OXYCODONE HCL 5 MG PO TABS
10.0000 mg | ORAL_TABLET | ORAL | Status: DC | PRN
Start: 2015-07-14 — End: 2015-07-24
  Administered 2015-07-15 (×2): 15 mg via ORAL
  Administered 2015-07-16: 20 mg via ORAL
  Administered 2015-07-16: 15 mg via ORAL
  Administered 2015-07-17: 10 mg via ORAL
  Administered 2015-07-17 – 2015-07-18 (×3): 20 mg via ORAL
  Administered 2015-07-18: 15 mg via ORAL
  Administered 2015-07-18: 10 mg via ORAL
  Administered 2015-07-19: 20 mg via ORAL
  Administered 2015-07-20: 10 mg via ORAL
  Administered 2015-07-20 (×3): 20 mg via ORAL
  Administered 2015-07-21: 10 mg via ORAL
  Administered 2015-07-21 – 2015-07-22 (×2): 15 mg via ORAL
  Administered 2015-07-22: 20 mg via ORAL
  Administered 2015-07-23: 10 mg via ORAL
  Administered 2015-07-23 (×2): 20 mg via ORAL
  Administered 2015-07-23 – 2015-07-24 (×2): 10 mg via ORAL
  Filled 2015-07-14 (×2): qty 3
  Filled 2015-07-14: qty 4
  Filled 2015-07-14 (×2): qty 2
  Filled 2015-07-14 (×3): qty 4
  Filled 2015-07-14 (×4): qty 2
  Filled 2015-07-14: qty 3
  Filled 2015-07-14: qty 4
  Filled 2015-07-14 (×2): qty 3
  Filled 2015-07-14 (×3): qty 4
  Filled 2015-07-14: qty 3
  Filled 2015-07-14: qty 4
  Filled 2015-07-14: qty 2
  Filled 2015-07-14 (×2): qty 4

## 2015-07-14 MED ORDER — BISACODYL 5 MG PO TBEC: 10.0000 mg | DELAYED_RELEASE_TABLET | Freq: Every day | ORAL | Status: DC | PRN
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
## 2015-07-14 NOTE — Progress Notes (Signed)
Patient ID: Margart SicklesJasmine Boeh, female   DOB: August 24, 1990, 24 y.o.   MRN: [id number] Patient arrived from 5N via hospital bed with RN and patient belongings. Patient oriented to room, rehab process, rehab schedule, fall prevention plan, rehab safety plan, health resource notebook, and call bell system. Patient resting comfortably in bed with no c/o of pain.
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
## 2015-07-14 NOTE — Care Management Note (Signed)
Case Management Note  Patient Details  Name: [name] MRN: [id number] Date of Birth: 08-02-1990  Subjective/Objective:   Pt medically stable for dc today.                   Action/Plan: Plan dc to [facility] later today.    Expected Discharge Date:    07/14/2015              Expected Discharge Plan:  IP Rehab Facility  In-House Referral:     Discharge planning Services  CM Consult  Post Acute Care Choice:    Choice offered to:     DME Arranged:    DME Agency:     HH Arranged:    HH Agency:     Status of Service:  Completed, signed off  If discussed at [employer] Meetings, dates discussed:    Additional Comments:  Quintella BatonJulie W. Zlatan Hornback, RN, BSN  Trauma/Neuro ICU Case Manager [phone]
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
## 2015-07-14 NOTE — Progress Notes (Signed)
Inpatient Rehabilitation  I have received insurance approval [facility] and medical [place] from [place] to admit pt. to CIR today.  Pt. initially asking if she could wait until tomorrow.  I explained that pt. would not begin her therapies until tomorrow and that today would be for transition purposes.  Pt. was agreeable.  I will meet with pt. and her mom to go over the admission paperwork this afternoon.  I have updated pt's RN Laureen OchsKorie as well as Macario GoldsJesse Scinto, CSW and Sidney AceJulie Amerson, RNCM (via text).  Please call if questions.  Weldon PickingSusan Arlyne Brandes PT Inpatient Rehab Admissions Coordinator Cell [phone] Office [phone]

## 2015-07-14 NOTE — Progress Notes (Signed)
[facility] Progress Note  11 Days Post-Op  Subjective: Sitting up in bed. Tolerating PO. Urinating and having BMs. Pain controlled Roller herself down the hallway using a wheelchair yesterday with PT.  Wants to know when she can get her c-collar off.  Objective:  Vital signs in last 24 hours: VSS  Temp:  [98 F (36.7 C)-98.2 F (36.8 C)] 98 F (36.7 C) (06/20 0520) Pulse Rate:  [84-97] 84 (06/20 0520) Resp:  [16-17] 17 (06/20 0520) BP: (114-123)/(64-74) 114/64 mmHg (06/20 0520) SpO2:  [94 %-99 %] 94 % (06/20 0520) Last BM Date: 07/13/15  Intake/Output from previous day:   Intake/Output this shift:    PE: Gen: Alert, cooperative, restless - cant find comfortable position. Card: RRR, no M/G/R heard Pulm: CTA, no W/R/R Abd: Soft, NT/ND, +BS, no HSM Ext: Ortho dressing on LLE. Left toes warm. Right pedal pulse 2+  Lab Results:   Recent Labs  07/12/15 0431  WBC 10.5  HGB 8.7*  HCT 27.4*  PLT 586*   BMET No results for input(s): NA, K, CL, CO2, GLUCOSE, BUN, CREATININE, CALCIUM in the last 72 hours. PT/INR  Recent Labs  07/13/15 0430 07/14/15 0450  LABPROT 23.7* 21.1*  INR 2.14* 1.83*   CMP     Component Value Date/Time   NA 135 07/07/2015 0521   K 3.7 07/07/2015 0521   CL 98* 07/07/2015 0521   CO2 28 07/07/2015 0521   GLUCOSE 106* 07/07/2015 0521   BUN 8 07/07/2015 0521   CREATININE 0.78 07/07/2015 0521   CALCIUM 9.0 07/07/2015 0521   PROT 6.6 07/03/2015 0431   ALBUMIN 2.9* 07/03/2015 0431   AST 111* 07/03/2015 0431   ALT 98* 07/03/2015 0431   ALKPHOS 66 07/03/2015 0431   BILITOT 2.2* 07/03/2015 0431   GFRNONAA >60 07/07/2015 0521   GFRAA >60 07/07/2015 0521   Lipase  No results found for: LIPASE     Studies/Results: No results found.  Anti-infectives: Anti-infectives    Start     Dose/Rate Route Frequency Ordered Stop   07/03/15 2200  ceFAZolin (ANCEF) IVPB 2g/100 mL premix  Status:  Discontinued     2 g 200 mL/hr  over 30 Minutes Intravenous Every 8 hours 07/03/15 1756 07/06/15 0912   06/30/15 1645  gentamicin (GARAMYCIN) 520 mg in dextrose 5 % 100 mL IVPB  Status:  Discontinued     520 mg 113 mL/hr over 60 Minutes Intravenous Every 24 hours 06/30/15 1638 07/06/15 0912   06/30/15 1500  gentamicin (GARAMYCIN) 520 mg in dextrose 5 % 100 mL IVPB  Status:  Discontinued     520 mg 113 mL/hr over 60 Minutes Intravenous Every 24 hours 06/30/15 0249 06/30/15 1638   06/30/15 1055  vancomycin (VANCOCIN) powder  Status:  Discontinued       As needed 06/30/15 1104 06/30/15 1419   06/30/15 1051  gentamicin (GARAMYCIN) injection  Status:  Discontinued       As needed 06/30/15 1055 06/30/15 1419   06/29/15 1400  ceFAZolin (ANCEF) IVPB 2g/100 mL premix  Status:  Discontinued     2 g 200 mL/hr over 30 Minutes Intravenous Every 8 hours 06/29/15 1238 07/03/15 1756   06/29/15 1400  gentamicin (GARAMYCIN) 520 mg in dextrose 5 % 100 mL IVPB     520 mg 113 mL/hr over 60 Minutes Intravenous  Once 06/29/15 1309 06/29/15 1634   06/28/15 1200  ceFAZolin (ANCEF) IVPB 2g/100 mL premix  Status:  Discontinued     2  g 200 mL/hr over 30 Minutes Intravenous Every 6 hours 06/28/15 1155 06/28/15 1211   06/28/15 1200  ceFAZolin (ANCEF) IVPB 1 g/50 mL premix  Status:  Discontinued     1 g 100 mL/hr over 30 Minutes Intravenous Every 8 hours 06/28/15 1055 06/28/15 1211   06/28/15 0430  ceFAZolin (ANCEF) IVPB 1 g/50 mL premix     1 g 100 mL/hr over 30 Minutes Intravenous  Once 06/28/15 0420 06/28/15 0520       Assessment/Plan PHBC Nasal FX - per Dr. Suszanne Connerseoh C7 TVP FX - per Dr. Wynetta Emeryram, collar for ligamentous injury Foreign body in esophagus - tongue ring connector, was in distal esophagus. Should pass. R 1,2, 4-10 and L 1,3,5 rib FX and pulm contusions/B occult PTX - pulm toilet, duo-nebs L2 TVP FX Pelvic FX with R obturator ring and L acetabulum and sacral ala with SI diastasis s/p SI screws and anterior wall repair - s/p ORIF by Dr.  Carola FrostHandy, XRT, NWB LLE, WBAT RLE for transfers only Left femur fx s/p ex fix - s/p ORIF by Dr. Carola FrostHandy, NWB L tibial plateau and fibula FX - S/P ORIF by Dr. Carola FrostHandy Left ankle fx/dislocation s/p ORIF -- S/P post ORIF by Dr. Carola FrostHandy ABL anemia - Stable FEN - regular diet VTE - SCD's, Lovenox Dispo - Pt off IV pain Rxs, improving with PT, awaiting placement in CIR             - cleared c-spine after speaking with Dr. Wynetta Emeryram; no pain at rest or with active ROM, no TTP of c-spine.     LOS: 16 days    Adam PhenixElizabeth S Simaan , [facility] 07/14/2015, 8:18 AM Pager: [phone] Mon-Fri 7:00 am-4:30 pm Sat-Sun 7:00 am-11:30 am

## 2015-07-14 NOTE — Progress Notes (Signed)
Pt stable for d/c to CIR today per MD. Report was given to RN on 4W, all questions answered. PICC capped by IV team for transfer, and belongings were sent with pt. She was transferred by NT to 4W10.  [place], Latricia HeftKorie G
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
## 2015-07-14 NOTE — Interval H&P Note (Signed)
Elizabeth SicklesJasmine Dyer was admitted today to Inpatient Rehabilitation with the diagnosis of TBI/polytrauma.  The patient's history has been reviewed, patient examined, and there is no change in status.  Patient continues to be appropriate for intensive inpatient rehabilitation.  I have reviewed the patient's chart and labs.  Questions were answered to the patient's satisfaction. The PAPE has been reviewed and assessment remains appropriate.  SWARTZ,ZACHARY T 07/14/2015, 6:40 PM
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
## 2015-07-15 ENCOUNTER — Inpatient Hospital Stay (HOSPITAL_COMMUNITY): Payer: BLUE CROSS/BLUE SHIELD

## 2015-07-15 ENCOUNTER — Ambulatory Visit (HOSPITAL_COMMUNITY): Payer: BLUE CROSS/BLUE SHIELD

## 2015-07-15 ENCOUNTER — Inpatient Hospital Stay (HOSPITAL_COMMUNITY): Payer: BLUE CROSS/BLUE SHIELD | Admitting: Physical Therapy

## 2015-07-15 ENCOUNTER — Inpatient Hospital Stay (HOSPITAL_COMMUNITY): Payer: BLUE CROSS/BLUE SHIELD | Admitting: Speech Pathology

## 2015-07-15 ENCOUNTER — Inpatient Hospital Stay (HOSPITAL_COMMUNITY): Payer: BLUE CROSS/BLUE SHIELD | Admitting: Occupational Therapy

## 2015-07-15 LAB — PROTIME-INR
INR: 1.99 — ABNORMAL HIGH (ref 0.00–1.49)
PROTHROMBIN TIME: 22.5 s — AB (ref 11.6–15.2)

## 2015-07-15 LAB — COMPREHENSIVE METABOLIC PANEL
ALBUMIN: 3.2 g/dL — AB (ref 3.5–5.0)
ALT: 42 U/L (ref 14–54)
AST: 49 U/L — AB (ref 15–41)
Alkaline Phosphatase: 156 U/L — ABNORMAL HIGH (ref 38–126)
Anion gap: 9 (ref 5–15)
BUN: 11 mg/dL (ref 6–20)
CHLORIDE: 101 mmol/L (ref 101–111)
CO2: 26 mmol/L (ref 22–32)
CREATININE: 0.71 mg/dL (ref 0.44–1.00)
Calcium: 9.7 mg/dL (ref 8.9–10.3)
GFR calc Af Amer: >60 mL/min (ref >60)
GLUCOSE: 96 mg/dL (ref 65–99)
POTASSIUM: 3.9 mmol/L (ref 3.5–5.1)
Sodium: 136 mmol/L (ref 135–145)
Total Bilirubin: 1.2 mg/dL (ref 0.3–1.2)
Total Protein: 7.6 g/dL (ref 6.5–8.1)

## 2015-07-15 LAB — CBC WITH DIFFERENTIAL/PLATELET
BASOS ABS: 0 10*3/uL (ref 0.0–0.1)
BASOS PCT: 0 %
Eosinophils Absolute: 0.3 10*3/uL (ref 0.0–0.7)
Eosinophils Relative: 3 %
HEMATOCRIT: 28.2 % — AB (ref 36.0–46.0)
Hemoglobin: 9 g/dL — ABNORMAL LOW (ref 12.0–15.0)
LYMPHS PCT: 28 %
Lymphs Abs: 2.4 10*3/uL (ref 0.7–4.0)
MCH: 27.1 pg (ref 26.0–34.0)
MCHC: 31.9 g/dL (ref 30.0–36.0)
MCV: 84.9 fL (ref 78.0–100.0)
Monocytes Absolute: 0.8 10*3/uL (ref 0.1–1.0)
Monocytes Relative: 10 %
NEUTROS ABS: 5.1 10*3/uL (ref 1.7–7.7)
Neutrophils Relative %: 59 %
PLATELETS: 484 10*3/uL — AB (ref 150–400)
RBC: 3.32 MIL/uL — AB (ref 3.87–5.11)
RDW: 17.3 % — AB (ref 11.5–15.5)
WBC: 8.7 10*3/uL (ref 4.0–10.5)

## 2015-07-15 MED ORDER — METHOCARBAMOL 500 MG PO TABS
500.0000 mg | ORAL_TABLET | Freq: Four times a day (QID) | ORAL | Status: DC
  Administered 2015-07-15 – 2015-07-24 (×37): 500 mg via ORAL
  Filled 2015-07-15 (×37): qty 1

## 2015-07-15 MED ORDER — WARFARIN SODIUM 5 MG PO TABS
5.0000 mg | ORAL_TABLET | Freq: Once | ORAL | Status: AC
  Administered 2015-07-15: 5 mg via ORAL
  Filled 2015-07-15: qty 1

## 2015-07-15 MED ORDER — TRAMADOL HCL 50 MG PO TABS
100.0000 mg | ORAL_TABLET | Freq: Four times a day (QID) | ORAL | Status: DC
  Administered 2015-07-15 – 2015-07-24 (×37): 100 mg via ORAL
  Filled 2015-07-15 (×37): qty 2
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
## 2015-07-15 NOTE — Progress Notes (Signed)
Occupational Therapy Note  Patient Details  Name: [name] MRN: [id number] Date of Birth: 03/09/1990  Today's Date: 07/15/2015 OT Individual Time: 1400-1415 OT Individual Time Calculation (min): 15 min  and Today's Date: 07/15/2015 OT Missed Time: 15 Minutes Missed Time Reason: Pain  Pt c/o 10/10 pain in LLE/hip Individual Therapy  Pt resting in bed upon arrival.  Pt c/o 10/10 pain in left hip/LLE.  Rn aware and bringing medications.  Pt stated that any movements were painful.  Pt very apologetic.  Reviewed OT goals and discharge plans.  Reviewed role of OT.  Pt remained in bed with all needs within reach.    Lavone NeriLanier, [name] 07/15/2015, 2:43 PM

## 2015-07-15 NOTE — Progress Notes (Signed)
Patient information reviewed and entered into eRehab system by Evens Meno, RN, CRRN, PPS Coordinator.  Information including medical coding and functional independence measure will be reviewed and updated through discharge.    
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
## 2015-07-16 ENCOUNTER — Inpatient Hospital Stay (HOSPITAL_COMMUNITY): Payer: BLUE CROSS/BLUE SHIELD

## 2015-07-16 ENCOUNTER — Encounter (HOSPITAL_COMMUNITY): Payer: Self-pay

## 2015-07-16 ENCOUNTER — Inpatient Hospital Stay (HOSPITAL_COMMUNITY): Payer: BLUE CROSS/BLUE SHIELD | Admitting: Physical Therapy

## 2015-07-16 ENCOUNTER — Inpatient Hospital Stay (HOSPITAL_COMMUNITY): Payer: BLUE CROSS/BLUE SHIELD | Admitting: Speech Pathology

## 2015-07-16 DIAGNOSIS — F4321 Adjustment disorder with depressed mood: Secondary | ICD-10-CM

## 2015-07-16 DIAGNOSIS — S82142S Displaced bicondylar fracture of left tibia, sequela: Secondary | ICD-10-CM

## 2015-07-16 DIAGNOSIS — F4311 Post-traumatic stress disorder, acute: Secondary | ICD-10-CM

## 2015-07-16 LAB — PROTIME-INR
INR: 1.92 — ABNORMAL HIGH (ref 0.00–1.49)
Prothrombin Time: 21.9 seconds — ABNORMAL HIGH (ref 11.6–15.2)

## 2015-07-16 MED ORDER — WARFARIN SODIUM 7.5 MG PO TABS
7.5000 mg | ORAL_TABLET | Freq: Once | ORAL | Status: AC
  Administered 2015-07-16: 7.5 mg via ORAL
  Filled 2015-07-16: qty 1
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
## 2015-07-16 NOTE — Progress Notes (Signed)
Speech Language Pathology Daily Session Note  Patient Details  Name: [name] MRN: [id number] Date of Birth: Jun 05, 1990  Today's Date: 07/16/2015 SLP Individual Time: 1100-1200 SLP Individual Time Calculation (min): 60 min  Short Term Goals: Week 1: SLP Short Term Goal 1 (Week 1): STGs=LTGs  Skilled Therapeutic Interventions: Pt seen for skilled SLP services. Pt able to describe her daily activities with min A of clarifying questions for adequate detail. Pt reports that she finished a Engineer, sitemedical assistant program, but was fired from her last job and was currently in between jobs. She plans on continuing with some sort of college courses this Fall. Therapist was unclear what pt plans to study. Pt required max A this date to complete basic functional math related to med management, time and cooking. Some level of deficit appeared to be influenced by lack of basic knowledge ( ex. minutes in an hour, hours in a day, etc). Pt reported that her difficulty was likely secondary to pain meds and she attempted to excuse her deficits as normal. Pt able to perform single digit +-x/ with 80% acc and min A for occasional repetition and increased time.   Function:  Eating Eating                 Cognition Comprehension Comprehension assist level: Follows basic conversation/direction with extra time/assistive device  Expression   Expression assist level: Expresses complex 90% of the time/cues < 10% of the time  Social Interaction Social Interaction assist level: Interacts appropriately with others with medication or extra time (anti-anxiety, antidepressant).  Problem Solving Problem solving assist level: Solves basic 90% of the time/requires cueing < 10% of the time  Memory Memory assist level: Recognizes or recalls 75 - 89% of the time/requires cueing 10 - 24% of the time    Pain Pain Assessment Pain Assessment: 0-10 Pain Score: 2  Pain Location: Leg Pain Orientation: Left Patients Stated  Pain Goal: 2 Pain Intervention(s): Distraction;Rest  Therapy/Group: Individual Therapy  Rocky CraftsKara E Shylyn Younce MA, CCC-SLP 07/16/2015, 4:10 PM

## 2015-07-16 NOTE — Progress Notes (Signed)
Physical Therapy Session Note  Patient Details  Name: [name] MRN: [id number] Date of Birth: 28-Dec-1990  Today's Date: 07/16/2015 PT Individual Time: 1400-1515 PT Individual Time Calculation (min): 75 min   Short Term Goals: Week 1:  PT Short Term Goal 1 (Week 1): = LTGs due to anticipated LOS  Skilled Therapeutic Interventions/Progress Updates:   Session focused on wheelchair propulsion using BUE with mod I in controlled environment and supervision in community/outdoor environment, donning/doffing R leg rest and L elevating leg rest with supervision-setup assist and assist for managing LLE, bed mobility re-training in hospital bed and regular bed in ADL apartment with min A for LLE and introduction of leg lifter, functional transfers via lateral scoot from bed to wheelchair with supervision/setup and A/P transfer from wheelchair to and from ADL bed and ADL bed to [facility] min A. Discussed discharge planning, home setup, and DME needs at length with patient verbalizing understanding. Patient reports at home she plans to have her mom always lift her L leg for her, discussed goals of therapy to decrease caregiver burden and increase independence. Patient also stated she does not want to work on standing at this time as she does not plan to wear "real clothes" (robes/housecoats) and will not need to stand for clothing management. Patient left in wheelchair with all needs in reach.   Therapy Documentation Precautions:  Precautions Precautions: Posterior Hip, Fall Restrictions Weight Bearing Restrictions: Yes RLE Weight Bearing: Weight bearing as tolerated LLE Weight Bearing: Non weight bearing Pain: Pain Assessment Pain Assessment: Faces Pain Score: 2  Faces Pain Scale: Hurts little more Pain Type: Acute pain Pain Location: Back Pain Orientation: Left Pain Descriptors / Indicators: Aching;Sore Pain Onset: On-going Patients Stated Pain Goal: 2 Pain Intervention(s):  Repositioned   See Function Navigator for Current Functional Status.   Therapy/Group: Individual Therapy  [name], [name] A 07/16/2015, 5:12 PM

## 2015-07-16 NOTE — Care Management Note (Signed)
Inpatient Rehabilitation Center Individual Statement of Services  Patient Name:  Margart SicklesJasmine Geerdes  Date:  07/16/2015  Welcome to the Inpatient Rehabilitation Center.  Our goal is to provide you with an individualized program based on your diagnosis and situation, designed to meet your specific needs.  With this comprehensive rehabilitation program, you will be expected to participate in at least 3 hours of rehabilitation therapies Monday-Friday, with modified therapy programming on the weekends.  Your rehabilitation program will include the following services:  Physical Therapy (PT), Occupational Therapy (OT), Speech Therapy (ST), 24 hour per day rehabilitation nursing, Therapeutic Recreaction (TR), Neuropsychology, Case Management (Social Worker), Rehabilitation Medicine, Nutrition Services and Pharmacy Services  Weekly team conferences will be held on Tuesdays to discuss your progress.  Your Social Worker will talk with you frequently to get your input and to update you on team discussions.  Team conferences with you and your family in attendance may also be held.  Expected length of stay: 7-10 days  Overall anticipated outcome: modified independent @ wheelchair  Depending on your progress and recovery, your program may change. Your Social Worker will coordinate services and will keep you informed of any changes. Your Social Worker's name and contact numbers are listed  below.  The following services may also be recommended but are not provided by the Inpatient Rehabilitation Center:   Driving Evaluations  Home Health Rehabiltiation Services  Outpatient Rehabilitation Services  Vocational Rehabilitation   Arrangements will be made to provide these services after discharge if needed.  Arrangements include referral to agencies that provide these services.  Your insurance has been verified to be:  [employer] [place] Your primary doctor is:  None  Pertinent information will be shared with your  doctor and your insurance company.  Social Worker:  DarlingtonLucy Bartley Vuolo, [place] [phone] or ([phone]) [phone]   Information discussed with and copy given to patient by: Amada JupiterHOYLE, Tinesha Siegrist, 07/16/2015, 2:03 PM

## 2015-07-16 NOTE — Progress Notes (Signed)
Occupational Therapy Session Note  Patient Details  Name: [name] MRN: [id number] Date of Birth: 10/14/1990  Today's Date: 07/16/2015 OT Individual Time: 0900-1000 OT Individual Time Calculation (min): 60 min    Short Term Goals: Week 1:   STG=LTG secondary to ELOS  Skilled Therapeutic Interventions/Progress Updates:    Pt asleep in bed upon arrival.  Pt required min verbal cues to arouse.  Pt agreeable to bathing while seated EOB.  Pt does not have any clothing but called her Mom who stated she would bring some clothing in during the afternoon. Pt required min assist for LLE to sit EOB. Pt completed required assistance bathing RLE without AE.  Pt requested to use toilet and [facility] with steady A.  Pt requires more than a reasonable amount of time to complete all tasks with c/o intermittent increased pain in left upper back with activity. Discussed discharge plans, LTG, and recommended DME (BSC) for use at home.  Pt agreeable with goals and recommended equipment.    Therapy Documentation Precautions:  Precautions Precautions: Posterior Hip, Fall Restrictions Weight Bearing Restrictions: Yes RLE Weight Bearing: Weight bearing as tolerated LLE Weight Bearing: Non weight bearing  Pain:  Pt c/o 2/10 pain in LLE and upper left back; RN and repositioned  See Function Navigator for Current Functional Status.   Therapy/Group: Individual Therapy  Rich BraveLanier, Jersi Mcmaster Chappell 07/16/2015, 11:00 AM

## 2015-07-17 ENCOUNTER — Inpatient Hospital Stay (HOSPITAL_COMMUNITY): Payer: BLUE CROSS/BLUE SHIELD | Admitting: Speech Pathology

## 2015-07-17 ENCOUNTER — Inpatient Hospital Stay (HOSPITAL_COMMUNITY): Payer: BLUE CROSS/BLUE SHIELD | Admitting: Physical Therapy

## 2015-07-17 ENCOUNTER — Inpatient Hospital Stay (HOSPITAL_COMMUNITY): Payer: Self-pay

## 2015-07-17 LAB — PROTIME-INR
INR: 2.05 — ABNORMAL HIGH (ref 0.00–1.49)
Prothrombin Time: 23 seconds — ABNORMAL HIGH (ref 11.6–15.2)

## 2015-07-17 MED ORDER — WARFARIN SODIUM 5 MG PO TABS
5.0000 mg | ORAL_TABLET | Freq: Once | ORAL | Status: AC
  Administered 2015-07-17: 5 mg via ORAL
  Filled 2015-07-17: qty 1

## 2015-07-17 NOTE — Progress Notes (Signed)
Speech Language Pathology Daily Session Note  Patient Details  Name: Margart SicklesJasmine Lamson MRN: [id number] Date of Birth: 19-Feb-1990  Today's Date: 07/17/2015 SLP Individual Time: [id number] SLP Individual Time Calculation (min): 45 min  Short Term Goals: Week 1: SLP Short Term Goal 1 (Week 1): STGs=LTGs  Skilled Therapeutic Interventions: Pt seen for skilled SLP services. Pt completed basic coin counting task with 80% acc. Functional math related to time management/schedule required max A to complete ex "Our session started at 10:30 and is 45 min long. When will we finish?" Pt learned a novel card game with min A for working memory with learning.   Function:  Eating Eating                 Cognition Comprehension Comprehension assist level: Understands complex 90% of the time/cues 10% of the time  Expression   Expression assist level: Expresses complex 90% of the time/cues < 10% of the time  Social Interaction Social Interaction assist level: Interacts appropriately with others with medication or extra time (anti-anxiety, antidepressant).  Problem Solving Problem solving assist level: Solves basic 90% of the time/requires cueing < 10% of the time  Memory Memory assist level: Recognizes or recalls 75 - 89% of the time/requires cueing 10 - 24% of the time    Pain Pain Assessment Faces Pain Scale: Hurts little more Pain Intervention(s): Repositioned  Therapy/Group: Individual Therapy  Rocky CraftsKara E Jeramey Lanuza MA, CCC-SLP 07/17/2015, 11:27 AM

## 2015-07-17 NOTE — Progress Notes (Signed)
Physical Therapy Session Note  Patient Details  Name: [name] MRN: [id number] Date of Birth: 1991/01/02  Today's Date: 07/17/2015 PT Individual Time: 1400-1515 PT Individual Time Calculation (min): 75 min   Short Term Goals: Week 1:  PT Short Term Goal 1 (Week 1): = LTGs due to anticipated LOS  Skilled Therapeutic Interventions/Progress Updates:   Session focused on decreasing caregiver burden and increasing independence with functional transfers, bed mobility, wheelchair propulsion, and wheelchair parts management. Patient in bed with eyes closed upon arrival, required more than reasonable amount of time to open eyes and initiate therapy. Patient transferred supine > long sit with HOB flat and use of rail with supervision and increased time. Patient required max verbal cues for scooting to edge of bed to perform lateral scoot to wheelchair while keeping LLE propped on bed. Patient performed lateral scoot transfers wheelchair <> toilet using grab bar and wheelchair <> therapy mat with mod verbal cues for sequencing and technique with focus on patient directing care for setup. Patient donned/doffed leg rests with setup/supervision and more than reasonable time. Patient propelled wheelchair on unit using BUE with supervision. Patient c/o increased back pain limiting participation, provided with moist heat pack. RN notified to order K-pad for patient. Instructed in supine RLE heel slides, ankle pumps, and attempted SLR but patient unable to complete. Patient initially required physical assistance to lift/lower LLE on/off therapy mat and on/off ELR with instructions on use of leg lifter while maintaining hip precautions but by end of session patient able to perform entire wheelchair setup and lateral scoot transfer from wheelchair back to bed with supervision and lifted LLE onto bed for sitting EOB > long sit with use of leg lifter and extra time. Patient also appeared motivated by presence of cousin  at end of session. Patient left sitting on bed with NT and cousin present, needs in reach.   Therapy Documentation Precautions:  Precautions Precautions: Posterior Hip, Fall Restrictions Weight Bearing Restrictions: Yes RLE Weight Bearing: Weight bearing as tolerated LLE Weight Bearing: Non weight bearing Pain: Pain Assessment Pain Assessment: 0-10 Pain Score: 8  Faces Pain Scale: Hurts little more Pain Type: Acute pain Pain Location: Back Pain Orientation: Right Pain Descriptors / Indicators: Aching Pain Onset: On-going Pain Intervention(s): Heat applied;RN made aware;Repositioned;Rest   See Function Navigator for Current Functional Status.   Therapy/Group: Individual Therapy  Kerney ElbeVarner, [name] 07/17/2015, 3:05 PM

## 2015-07-17 NOTE — Progress Notes (Signed)
ANTICOAGULATION CONSULT NOTE - Follow Up Consult  Pharmacy Consult for coumadin Indication: VTE prophylaxis  No Known Allergies  Patient Measurements: Height: 5\' 11"  (180.3 cm) Weight: 177 lb 11.2 oz (80.604 kg) IBW/kg (Calculated) : 70.8 Heparin Dosing Weight:   Vital Signs: Temp: 98.5 F (36.9 C) (06/23 0534) Temp Source: Oral (06/23 0534) BP: 108/55 mmHg (06/23 0534) Pulse Rate: 81 (06/23 0534)  Labs:  Recent Labs  07/15/15 0450 07/16/15 0507 07/17/15 0547  HGB 9.0*  --   --   HCT 28.2*  --   --   PLT 484*  --   --   LABPROT 22.5* 21.9* 23.0*  INR 1.99* 1.92* 2.05*  CREATININE 0.71  --   --     Estimated Creatinine Clearance: 122.2 mL/min (by C-G formula based on Cr of 0.71).   Medications:  Scheduled:  . docusate sodium  100 mg Oral BID  . methocarbamol  500 mg Oral QID  . oxyCODONE  20 mg Oral Q12H  . pantoprazole  40 mg Oral BID  . polyethylene glycol  17 g Oral Daily  . traMADol  100 mg Oral QID  . Warfarin - Pharmacist Dosing Inpatient   Does not apply q1800   Infusions:    Assessment: 25 yo female is currently on therapeutic coumadin for VTE prophylaxis.  INR today is 2.05.  Goal of Therapy:  INR 2-3 Monitor platelets by anticoagulation protocol: Yes   Plan:  Warfarin 5 mg x1 (may need 7.5 mg alt 5 mg) Daily INR  Beuford Garcilazo, Tsz-Yin 07/17/2015,8:34 AM

## 2015-07-17 NOTE — IPOC Note (Signed)
Overall Plan of Care [facility](IPOC) Patient Details Name: [name] MRN: [id number] DOB: 01-20-1991  Admitting Diagnosis: Trauma - Multitrauma -TBI  Hospital Problems: Principal Problem:   Traumatic brain injury with loss of consciousness of 1 hour to 5 hours 59 minutes (HCC) Active Problems:   Multiple pelvic fractures (HCC)   Cervical transverse process fracture (HCC)   Multiple fractures of ribs of both sides   Fracture of left tibial plateau   Acute blood loss anemia     Functional Problem List: Nursing Edema, Endurance, Medication Management, Pain, Safety, Sensory, Skin Integrity  PT Balance, Endurance, Edema, Motor, Pain, Safety  OT Balance, Endurance, Motor, Pain, Safety, Skin Integrity, Sensory  SLP Cognition  TR         Basic ADL's: OT Grooming, Bathing, Dressing, Toileting     Advanced  ADL's: OT Simple Meal Preparation     Transfers: PT Bed Mobility, Bed to Chair, Car, Occupational psychologisturniture  OT Toilet, Research scientist (life sciences)Tub/Shower     Locomotion: PT Psychologist, prison and probation servicesWheelchair Mobility, Ambulation, Stairs     Additional Impairments: OT None  SLP Social Cognition   Memory, Problem Solving  TR      Anticipated Outcomes Item Anticipated Outcome  Self Feeding n/a  Swallowing      Basic self-care  setup   Toileting  mod I    Bathroom Transfers mod I   Bowel/Bladder  Min assist  Transfers  mod I  Locomotion  mod I wheelchair level  Communication     Cognition  Mod I   Pain  <4 on a 0-10 scale  Safety/Judgment  Min assist using call bell for assistance   Therapy Plan: PT Intensity: Minimum of 1-2 x/day ,45 to 90 minutes PT Frequency: 5 out of 7 days PT Duration Estimated Length of Stay: 7-10 days OT Intensity: Minimum of 1-2 x/day, 45 to 90 minutes OT Frequency: 5 out of 7 days OT Duration/Estimated Length of Stay: 7-10 days SLP Intensity: Minumum of 1-2 x/day, 30 to 90 minutes SLP Frequency: 1 to 3 out of 7 days SLP Duration/Estimated Length of Stay: 7-9 days        Team  Interventions: Nursing Interventions Patient/Family Education, Pain Management, Medication Management, Skin Care/Wound Management, Discharge Planning, Psychosocial Support  PT interventions Ambulation/gait training, Warden/rangerBalance/vestibular training, Cognitive remediation/compensation, Community reintegration, Discharge planning, Disease management/prevention, DME/adaptive equipment instruction, Functional mobility training, Neuromuscular re-education, Pain management, Patient/family education, Psychosocial support, Therapeutic Activities, Therapeutic Exercise, UE/LE Strength taining/ROM, UE/LE Coordination activities, Wheelchair propulsion/positioning  OT Interventions Warden/rangerBalance/vestibular training, Community reintegration, Discharge planning, Disease mangement/prevention, Neuromuscular re-education, Cognitive remediation/compensation, Self Care/advanced ADL retraining, Therapeutic Exercise, DME/adaptive equipment instruction, Pain management, Skin care/wound managment, Wheelchair propulsion/positioning, UE/LE Strength taining/ROM, UE/LE Coordination activities, Patient/family education, Functional mobility training, Psychosocial support, Therapeutic Activities  SLP Interventions Cognitive remediation/compensation, Cueing hierarchy, Functional tasks, Patient/family education, Therapeutic Activities, Internal/external aids, Environmental controls  TR Interventions    SW/CM Interventions Discharge Planning, Psychosocial Support, Patient/Family Education    Team Discharge Planning: Destination: PT-Home ,OT- Home , SLP-Home Projected Follow-up: PT-Home health PT, OT-  Home health OT, SLP- (TBD) Projected Equipment Needs: PT-Wheelchair cushion (measurements), Wheelchair (measurements), OT-  , SLP-None recommended by SLP Equipment Details: PT- , OT-  Patient/family involved in discharge planning: PT- Patient,  OT-Patient, SLP-Patient  MD ELOS: 8-10 days Medical Rehab Prognosis:  Excellent Assessment: The  patient has been admitted for CIR therapies with the diagnosis of TBI/polytrauma. The team will be addressing functional mobility, strength, stamina, balance, safety, adaptive techniques and equipment, self-care, bowel and bladder mgt,  patient and caregiver education, pain mgt, ortho precautions, behavior/cognition, ego support. Goals have been set at mod I for mobility at w/c level, mod I with self-care except bowel/bladder (min assist), mod I for cognition.    Elizabeth OysterZachary T. Swartz, MD, FAAPMR      See Team Conference Notes for weekly updates to the plan of care

## 2015-07-17 NOTE — Plan of Care (Signed)
Problem: RH Balance Goal: LTG Patient will maintain dynamic standing with ADLs (OT) LTG: Patient will maintain dynamic standing balance with assist during activities of daily living (OT)  Outcome: Not Applicable Date Met:  78/47/84 D/c goal at this time to align with pt 's goals
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
## 2015-07-17 NOTE — Progress Notes (Signed)
Occupational Therapy Session Note  Patient Details  Name: [name] MRN: [id number] Date of Birth: 05-12-1990  Today's Date: 07/17/2015 OT Individual Time: [id number] OT Individual Time Calculation (min): 45 min  and Today's Date: 07/17/2015 OT Missed Time: 15 Minutes Missed Time Reason: Patient fatigue   Short Term Goals: Week 1:   STG=LTG secondary to ELOS  Skilled Therapeutic Interventions/Progress Updates:    Pt asleep upon arrival.  Pt required min multimodal cues to arouse.  Pt stated she didn't sleep well during the night.  Pt required max verbal cues to keep eyes open and respond to therapist.  Pt declined bathing this morning.  Pt had on gown from home. Discussed discharge plans and home set up.  Pt declined standing while she is in hospital.  Pt stated that she can get her mom to help her.  Session ended early secondary to pt's inability to respond to questions and difficulty keeping eyes open.   Therapy Documentation Precautions:  Precautions Precautions: Posterior Hip, Fall Restrictions Weight Bearing Restrictions: Yes RLE Weight Bearing: Weight bearing as tolerated LLE Weight Bearing: Non weight bearing General: General OT Amount of Missed Time: 15 Minutes Vital Signs:  Pain:  Pt reported that her pain was "ok"  See Function Navigator for Current Functional Status.   Therapy/Group: Individual Therapy  Rich BraveLanier, Klee Kolek Chappell 07/17/2015, 9:54 AM

## 2015-07-18 ENCOUNTER — Inpatient Hospital Stay (HOSPITAL_COMMUNITY): Payer: Self-pay | Admitting: Physical Therapy

## 2015-07-18 ENCOUNTER — Inpatient Hospital Stay (HOSPITAL_COMMUNITY): Payer: Self-pay | Admitting: Occupational Therapy

## 2015-07-18 LAB — PROTIME-INR
INR: 2.03 — ABNORMAL HIGH (ref 0.00–1.49)
PROTHROMBIN TIME: 22.8 s — AB (ref 11.6–15.2)

## 2015-07-18 MED ORDER — WARFARIN SODIUM 7.5 MG PO TABS
7.5000 mg | ORAL_TABLET | Freq: Once | ORAL | Status: AC
  Administered 2015-07-18: 7.5 mg via ORAL
  Filled 2015-07-18: qty 1

## 2015-07-18 NOTE — Progress Notes (Signed)
ANTICOAGULATION CONSULT NOTE - Follow Up Consult  Pharmacy Consult for coumadin Indication: VTE prophylaxis  No Known Allergies  Patient Measurements: Height: 5\' 11"  (180.3 cm) Weight: 177 lb 11.2 oz (80.604 kg) IBW/kg (Calculated) : 70.8 Heparin Dosing Weight:   Vital Signs: Temp: 98.8 F (37.1 C) (06/24 0556) Temp Source: Oral (06/24 0556) BP: 101/60 mmHg (06/24 0556) Pulse Rate: 82 (06/24 0556)  Labs:  Recent Labs  07/16/15 0507 07/17/15 0547 07/18/15 0622  LABPROT 21.9* 23.0* 22.8*  INR 1.92* 2.05* 2.03*    Estimated Creatinine Clearance: 122.2 mL/min (by C-G formula based on Cr of 0.71).   Medications:  Scheduled:  . docusate sodium  100 mg Oral BID  . methocarbamol  500 mg Oral QID  . oxyCODONE  20 mg Oral Q12H  . pantoprazole  40 mg Oral BID  . polyethylene glycol  17 g Oral Daily  . traMADol  100 mg Oral QID  . warfarin  7.5 mg Oral ONCE-1800  . Warfarin - Pharmacist Dosing Inpatient   Does not apply q1800   Infusions:    Assessment: 25 yo female on coumadin for VTE prophylaxis. INR remains therapeutic today at 2.03. No CBC since 6/21 - Hgb 9.0, plt 484. No bleeding noted.   Goal of Therapy:  INR 2-3 Monitor platelets by anticoagulation protocol: Yes   Plan:  Warfarin 7.5 mg x1  Daily INR CBC tomorrow Monitor CBC, s/sx bleeding  Sherle Poeob Vincent, PharmD Clinical Pharmacy Resident 10:14 AM, 07/18/2015
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
## 2015-07-18 NOTE — Progress Notes (Signed)
Physical Therapy Session Note  Patient Details  Name: Margart SicklesJasmine Pullin MRN: [id number] Date of Birth: [birth year]  Today's Date: 07/18/2015 PT Individual Time: 0830-0900 PT Individual Time Calculation (min): 30 min   Short Term Goals: Week 1:  PT Short Term Goal 1 (Week 1): = LTGs due to anticipated LOS  Skilled Therapeutic Interventions/Progress Updates:   Pt limited by toileting needs in session. Pt with limited knee motion with gentle AROM due to pain. Pt would continue to benefit from skilled PT services to increase functional mobility.  Therapy Documentation Precautions:  Precautions Precautions: Posterior Hip, Fall Precaution Booklet Issued: Yes (comment) Precaution Comments: cervical brace has been removed, post hip prec on L Restrictions Weight Bearing Restrictions: Yes RLE Weight Bearing: Weight bearing as tolerated LLE Weight Bearing: Non weight bearing Vital Signs: Therapy Vitals Temp: 98.8 F (37.1 C) Temp Source: Oral Pulse Rate: 82 Resp: 18 BP: 101/60 mmHg Patient Position (if appropriate): Lying Oxygen Therapy SpO2: 97 % O2 Device: Not Delivered Other Treatments:  Pt performs knee flexion/ext AAROM on L. B/L hip abd AAROM and R ankle DF AAROM performed. Pt performs bridging with Mod A x2. L/S paraspinal release performed. Pt performs bed mobility with dual motor tasks within functional context. Pt educated on rehab plan, precautions, safety in mobility, and pain science.    See Function Navigator for Current Functional Status.   Therapy/Group: Individual Therapy  Christia ReadingKinney, Stormie Ventola G 07/18/2015, 9:01 AM

## 2015-07-19 ENCOUNTER — Inpatient Hospital Stay (HOSPITAL_COMMUNITY): Payer: BLUE CROSS/BLUE SHIELD | Admitting: Occupational Therapy

## 2015-07-19 ENCOUNTER — Inpatient Hospital Stay (HOSPITAL_COMMUNITY): Payer: BLUE CROSS/BLUE SHIELD | Admitting: Physical Therapy

## 2015-07-19 LAB — CBC
HEMATOCRIT: 30.1 % — AB (ref 36.0–46.0)
HEMOGLOBIN: 9.5 g/dL — AB (ref 12.0–15.0)
MCH: 26.4 pg (ref 26.0–34.0)
MCHC: 31.6 g/dL (ref 30.0–36.0)
MCV: 83.6 fL (ref 78.0–100.0)
Platelets: 520 10*3/uL — ABNORMAL HIGH (ref 150–400)
RBC: 3.6 MIL/uL — AB (ref 3.87–5.11)
RDW: 17.6 % — ABNORMAL HIGH (ref 11.5–15.5)
WBC: 7.9 10*3/uL (ref 4.0–10.5)

## 2015-07-19 LAB — PROTIME-INR
INR: 1.6 — ABNORMAL HIGH (ref 0.00–1.49)
Prothrombin Time: 19.1 seconds — ABNORMAL HIGH (ref 11.6–15.2)

## 2015-07-19 MED ORDER — GRX ANALGESIC BALM EX OINT
2.0000 g | TOPICAL_OINTMENT | Freq: Two times a day (BID) | CUTANEOUS | Status: DC
  Filled 2015-07-19: qty 28

## 2015-07-19 MED ORDER — WARFARIN SODIUM 5 MG PO TABS
10.0000 mg | ORAL_TABLET | Freq: Once | ORAL | Status: AC
  Administered 2015-07-19: 10 mg via ORAL
  Filled 2015-07-19: qty 2

## 2015-07-19 MED ORDER — MUSCLE RUB 10-15 % EX CREA
TOPICAL_CREAM | Freq: Two times a day (BID) | CUTANEOUS | Status: DC
  Administered 2015-07-19 – 2015-07-24 (×5): via TOPICAL
  Filled 2015-07-19: qty 85

## 2015-07-19 NOTE — Progress Notes (Signed)
Occupational Therapy Session Note  Patient Details  Name: [name] MRN: [id number] Date of Birth: 1990/05/12  Today's Date: 07/19/2015 OT Individual Time: 1040-1108 OT Individual Time Calculation (min): 28 min    Skilled Therapeutic Interventions/Progress Updates: patient groggy and having difficulty staying awake, participating, and following instructions.   She finally stated she was groggy from pain meds and did not sleep last night.  She participated in upper extremity exercises for as flong as she could stay awake.     This clinician called Facilities to take a look at patient's far right blinded window that did not work.   Patient complained that the sun and lights shine into that window onto her eyes uncomfortably.   Facilities was able to get blinds into working order.     Therapy Documentation Precautions:  Precautions Precautions: Posterior Hip, Fall Precaution Booklet Issued: Yes (comment) Precaution Comments: cervical brace has been removed, post hip prec on L Restrictions Weight Bearing Restrictions: Yes RLE Weight Bearing: Weight bearing as tolerated LLE Weight Bearing: Non weight bearing General: General OT Amount of Missed Time: 32 Minutes (32 min) Pain:denied   See Function Navigator for Current Functional Status.   Therapy/Group: Individual Therapy  Bud Faceickett, Tayelor Osborne [facility] 07/19/2015, 3:44 PM

## 2015-07-19 NOTE — Progress Notes (Signed)
ANTICOAGULATION CONSULT NOTE - Follow Up Consult  Pharmacy Consult for coumadin Indication: VTE prophylaxis  No Known Allergies  Patient Measurements: Height: 5\' 11"  (180.3 cm) Weight: 177 lb 11.2 oz (80.604 kg) IBW/kg (Calculated) : 70.8 Heparin Dosing Weight:   Vital Signs: Temp: 98.6 F (37 C) (06/25 0613) Temp Source: Oral (06/25 16100613) BP: 106/65 mmHg (06/25 0613) Pulse Rate: 81 (06/25 0613)  Labs:  Recent Labs  07/17/15 0547 07/18/15 0622 07/19/15 0644  HGB  --   --  9.5*  HCT  --   --  30.1*  PLT  --   --  520*  LABPROT 23.0* 22.8* 19.1*  INR 2.05* 2.03* 1.60*    Estimated Creatinine Clearance: 122.2 mL/min (by C-G formula based on Cr of 0.71).   Medications:  Scheduled:  . docusate sodium  100 mg Oral BID  . methocarbamol  500 mg Oral QID  . oxyCODONE  20 mg Oral Q12H  . pantoprazole  40 mg Oral BID  . polyethylene glycol  17 g Oral Daily  . traMADol  100 mg Oral QID  . warfarin  10 mg Oral ONCE-1800  . Warfarin - Pharmacist Dosing Inpatient   Does not apply q1800   Infusions:    Assessment: 25 yo female on coumadin for VTE prophylaxis. INR 2.03>1.6 today. CBC stable. No bleeding noted.  Goal of Therapy:  INR 2-3 Monitor platelets by anticoagulation protocol: Yes   Plan:  Warfarin 10 mg x1  Daily INR CBC tomorrow Monitor CBC, s/sx bleeding  Sherle Poeob Damean Poffenberger, PharmD Clinical Pharmacy Resident 10:56 AM, 07/19/2015

## 2015-07-19 NOTE — Progress Notes (Signed)
Physical Therapy Session Note  Patient Details  Name: [name] MRN: [id number] Date of Birth: 05-06-1990  Today's Date: 07/19/2015 PT Individual Time: 0900-1000 and 1330-1400 PT Individual Time Calculation (min): 60 min and 30 min  Today's Date: 07/19/2015 PT Concurrent Time: 8295-62130845-0900 PT Concurrent Time Calculation (min): 15 min  Short Term Goals: Week 1:  PT Short Term Goal 1 (Week 1): = LTGs due to anticipated LOS  Skilled Therapeutic Interventions/Progress Updates:   Treatment 1: Patient in bed, required max verbal cues for arousal and to keep eyes open to participate initially. Session focused on bed mobility re-training with HOB flat for sit <> long sit, lateral scoot transfers from bed > wheelchair while keeping LLE on bed with setup for wheelchair placement, donning/doffing R leg rest and L elevating leg rest with setup/supervision and increased timing using leg lifter for LLE management, modified stand pivot transfer wheelchair <> BSC using armrests with supervision and increased time with setup for hygiene, wheelchair mobility using BUE with mod I in controlled environment and supervision-min A in community/outdoor environment including on/off elevators, over thresholds, and on uneven surfaces. Moist heat pack applied to back for pain management, discussed with MD with plan to order K-pad and muscle rub. Patient left sitting in wheelchair with RN present.   Treatment 2: Patient requesting to stay in bed. Patient instructed in repositioning higher in bed and keeping LLE in neutral alignment due to hip ER in supine using bed rails and RLE. Performed supine BLE therex for strengthening and ROM: R ankle pumps x 20, R heel slides x 20, RLE assisted SLR x 10, glute sets with 3 sec hold x 20, R/L quad sets with 3 sec hold x 20, assisted L knee flexion/extension within small pain-free ROM x 10, assisted R/L hip abduction x 10. Patient left semi reclined in bed with all needs in reach.    Therapy Documentation Precautions:  Precautions Precautions: Posterior Hip, Fall Precaution Booklet Issued: Yes (comment) Precaution Comments: cervical brace has been removed, post hip prec on L Restrictions Weight Bearing Restrictions: Yes RLE Weight Bearing: Weight bearing as tolerated LLE Weight Bearing: Non weight bearing Pain: Pain Assessment Pain Assessment: Faces Pain Score: 5  Faces Pain Scale: Hurts little more Pain Type: Acute pain Pain Location: Back Pain Orientation: Right Pain Descriptors / Indicators: Aching;Sore Pain Onset: On-going Pain Intervention(s): Heat applied;Rest;Repositioned;MD notified (Comment)   See Function Navigator for Current Functional Status.   Individual Therapy and Concurrent Therapy  Kerney ElbeVarner, Rand Etchison A 07/19/2015, 10:24 AM

## 2015-07-19 NOTE — Progress Notes (Signed)
Occupational Therapy Session Note  Patient Details  Name: [name] MRN: [id number] Date of Birth: August 02, 1990  Today's Date: 07/19/2015 OT Individual Time: 1040-1108 OT Individual Time Calculation (min): 28 min    Skilled Therapeutic Interventions/Progress Updates:    Pt worked on sit to supine and supine to sit from hospital bed during session.  Min assist still needed to advance the LLE on off of the bed, even with use of the leg lifter.  She was able to state 3/3 hip precautions and weighbearing status correctly.  Had her demonstrate crossing the RLE over the left knee for doffing and donning gripper sock as well as applying lotion to the left foot.  She was able to complete this with supervision while adhering to hip precautions.  [name] transferred back into the bed at end of session after sitting for 15 mins.  She declined transferring and sitting up in the wheelchair.  Call button in reach  Therapy Documentation Precautions:  Precautions Precautions: Posterior Hip, Fall Precaution Booklet Issued: Yes (comment) Precaution Comments: cervical brace has been removed, post hip prec on L Restrictions Weight Bearing Restrictions: Yes RLE Weight Bearing: Weight bearing as tolerated LLE Weight Bearing: Non weight bearing  Pain: Pain Assessment Pain Assessment: Faces Pain Score: 2  Pain Type: Acute pain Pain Location: Back Pain Orientation: Lower Pain Descriptors / Indicators: Aching Pain Intervention(s): Repositioned ADL: See Function Navigator for Current Functional Status.   Therapy/Group: Individual Therapy  TRUE Shackleford OTR/L 07/19/2015, 3:46 PM
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
## 2015-07-20 ENCOUNTER — Inpatient Hospital Stay (HOSPITAL_COMMUNITY): Payer: BLUE CROSS/BLUE SHIELD | Admitting: Speech Pathology

## 2015-07-20 ENCOUNTER — Inpatient Hospital Stay (HOSPITAL_COMMUNITY): Payer: Self-pay

## 2015-07-20 LAB — PROTIME-INR
INR: 1.66 — AB (ref 0.00–1.49)
PROTHROMBIN TIME: 19.6 s — AB (ref 11.6–15.2)

## 2015-07-20 MED ORDER — WARFARIN SODIUM 5 MG PO TABS
10.0000 mg | ORAL_TABLET | Freq: Once | ORAL | Status: AC
  Administered 2015-07-20: 10 mg via ORAL
  Filled 2015-07-20: qty 2

## 2015-07-20 NOTE — Progress Notes (Signed)
Physical Therapy Session Note  Patient Details  Name: [name] MRN: [id number] Date of Birth: 27-Nov-1990  Today's Date: 07/20/2015 PT Individual Time: 1300-1330 PT Individual Time Calculation (min): 30 min   Short Term Goals: Week 1:  PT Short Term Goal 1 (Week 1): = LTGs due to anticipated LOS  Skilled Therapeutic Interventions/Progress Updates:   Pt received asleep in bed, easily awakened.  Pt agreeable to therapy, no reports of pain.  Pt requesting to don shirt and pants.  Pt able to don shirt with HOB elevated independently.  Pt required assistance to thread pants over each LE due to ROM restrictions; when cued to roll to pull pants over buttocks pt stated that she was not able to roll and she could lift her hips in the bed to pull them up.  Educated pt that may cause WB through LLE pt proceeded to bridge up and have family member pull her pants up the rest of the way.  Performed lateral scoot transfer bed > w/c to R side keeping LLE extended on bed with min A and verbal cues for use of RLE on floor to assist with lateral scoot.  In gym discussed various options for w/c > car transfer with pt stating she will get into the front seat and have her family lift her leg into the car first and then she will scoot over; discussed alternate method due to lack of knee flexion ROM where pt performs lateral scoot to R on driver's side back seat and then scoots posterior on back seat to keep LLE elevated.  Pt performed squat pivot w/c <> car on driver's side (to simulate back seat) with min A to manage LLE and verbal cues to sequence.  Continue to recommend that pt perform actual car transfer with family prior to D/C.  Returned to room where pt left in w/c with tray set up for lunch and all items within reach.   Therapy Documentation Precautions:  Precautions Precautions: Posterior Hip, Fall Precaution Booklet Issued: Yes (comment) Precaution Comments: cervical brace has been removed, post hip prec  on L Restrictions Weight Bearing Restrictions: Yes RLE Weight Bearing: Weight bearing as tolerated LLE Weight Bearing: Non weight bearing Vital Signs: Therapy Vitals Temp: 98.4 F (36.9 C) Temp Source: Oral Pulse Rate: 87 Resp: 18 BP: (!) 109/59 mmHg Patient Position (if appropriate): Lying Oxygen Therapy SpO2: 100 % O2 Device: Not Delivered Pain: No c/o pain at rest  See Function Navigator for Current Functional Status.   Therapy/Group: Individual Therapy  Edman CircleHall, Audra Faucette 07/20/2015, 8:10 PM
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
## 2015-07-20 NOTE — Progress Notes (Signed)
Speech Language Pathology Daily Session Note  Patient Details  Name: [name] MRN: [id number] Date of Birth: [birth year]  Today's Date: 07/20/2015 SLP Individual Time: 0800-0900 SLP Individual Time Calculation (min): 60 min  Short Term Goals: Week 1: SLP Short Term Goal 1 (Week 1): STGs=LTGs  Skilled Therapeutic Interventions: Pt seen for skilled SLP services. Pt was awakened by therapist and initially was less receptive to the idea of therapy. but responded well to time and encouragement.  Pt completed +/- task with 100% acc mod I and x tasks with min A. Functional math word problems completed with min- mod A to establish rationale for function. Improvement noted this date.   Function:  Eating Eating                 Cognition Comprehension Comprehension assist level: Follows basic conversation/direction with no assist  Expression   Expression assist level: Expresses complex 90% of the time/cues < 10% of the time  Social Interaction Social Interaction assist level: Interacts appropriately 75 - 89% of the time - Needs redirection for appropriate language or to initiate interaction.  Problem Solving Problem solving assist level: Solves basic 90% of the time/requires cueing < 10% of the time  Memory Memory assist level: Recognizes or recalls 90% of the time/requires cueing < 10% of the time    Pain Pain Assessment Pain Assessment: No/denies pain ("it's just uncomfortable- it doesn't hurt")   Therapy/Group: Individual Therapy  Rocky CraftsKara E [name] 07/20/2015, 8:59 AM

## 2015-07-20 NOTE — Progress Notes (Signed)
Occupational Therapy Note  Patient Details  Name: [name] MRN: [id number] Date of Birth: October 27, 1990  Today's Date: 07/20/2015 OT Individual Time: 1400-1430 OT Individual Time Calculation (min): 30 min   Pt c/o 4/10 pain in left upper back; repositioned and heat applied Individual Therapy  Pt resting in w/c upon arrival and requested use of BSC. Pt directed placement of BSC to facilitate safe transfer.  Pt required assistance with doffing/donning pants but completed transfer at supervision level with assistance to steady BSC.  Pt transferred back to w/c and positioned w/c correctly for transfer back to bed at supervision level.  Pt remained in bed with all needs within reach.    Lavone NeriLanier, Garlen Reinig [facility] 07/20/2015, 2:52 PM

## 2015-07-21 ENCOUNTER — Inpatient Hospital Stay (HOSPITAL_COMMUNITY): Payer: Self-pay

## 2015-07-21 ENCOUNTER — Inpatient Hospital Stay (HOSPITAL_COMMUNITY): Payer: BLUE CROSS/BLUE SHIELD | Admitting: Physical Therapy

## 2015-07-21 ENCOUNTER — Inpatient Hospital Stay (HOSPITAL_COMMUNITY): Payer: BLUE CROSS/BLUE SHIELD | Admitting: Speech Pathology

## 2015-07-21 LAB — PROTIME-INR
INR: 1.96 — ABNORMAL HIGH (ref 0.00–1.49)
Prothrombin Time: 22.2 seconds — ABNORMAL HIGH (ref 11.6–15.2)

## 2015-07-21 MED ORDER — WARFARIN SODIUM 7.5 MG PO TABS
7.5000 mg | ORAL_TABLET | Freq: Every day | ORAL | Status: DC
  Administered 2015-07-21: 7.5 mg via ORAL
  Filled 2015-07-21: qty 1

## 2015-07-21 NOTE — Progress Notes (Signed)
Occupational Therapy Note  Patient Details  Name: [name] MRN: [id number] Date of Birth: 1990-04-20  Today's Date: 07/21/2015 OT Individual Time: 1400-1430 OT Individual Time Calculation (min): 30 min   Pt denied pain Individual Therapy  Pt resting in bed upon arrival with visitors present.  Discussed DME requirements and recommended drop arm BSC.  Pt agreeable and stated she felt this would be better than the one she was using.  Continued discharge planning and home safety.  Pt remained in bed with all needs within reach.    Lavone NeriLanier, Baruch Lewers [facility] 07/21/2015, 2:47 PM
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
## 2015-07-21 NOTE — Progress Notes (Signed)
Speech Language Pathology Daily Session Note  Patient Details  Name: [name] MRN: [id number] Date of Birth: Mar 19, 1990  Today's Date: 07/21/2015 SLP Individual Time: 1300-1345 SLP Individual Time Calculation (min): 45 min  Short Term Goals: Week 1: SLP Short Term Goal 1 (Week 1): STGs=LTGs  Skilled Therapeutic Interventions: Pt seen for skilled SLP services. Pt completed functional reasoning task with mod A for organization of information and applying reason. Functional time management task completed with min- mod A for establishing rationale and using a clock for visual assist.   Function:  Eating Eating                 Cognition Comprehension Comprehension assist level: Follows basic conversation/direction with no assist  Expression   Expression assist level: Expresses basic needs/ideas: With no assist  Social Interaction Social Interaction assist level: Interacts appropriately 75 - 89% of the time - Needs redirection for appropriate language or to initiate interaction.  Problem Solving Problem solving assist level: Solves basic 90% of the time/requires cueing < 10% of the time  Memory Memory assist level: Recognizes or recalls 90% of the time/requires cueing < 10% of the time    Pain Pain Assessment Pain Assessment: No/denies pain  Therapy/Group: Individual Therapy  Rocky CraftsKara E Dory Verdun MA, CCC-SLP 07/21/2015, 4:03 PM
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
## 2015-07-21 NOTE — Progress Notes (Signed)
Physical Therapy Session Note  Patient Details  Name: Elizabeth SicklesJasmine Dyer MRN: [id number] Date of Birth: 10-27-1990  Today's Date: 07/21/2015 PT Individual Time: 1000-1100 PT Individual Time Calculation (min): 60 min   Short Term Goals: Week 1:  PT Short Term Goal 1 (Week 1): = LTGs due to anticipated LOS  Skilled Therapeutic Interventions/Progress Updates:   Session focused on wheelchair mobility using BUE in controlled and community environments including throughout hospital, on/off elevators, over thresholds, and outdoors on brick and concrete surfaces with mod I, wheelchair leg rest management with setup assist due to hip precautions and use of leg lifter to lift LLE on/off ELR, squat pivot transfers with supervision, and simulated car transfer to sedan height to simulate back passenger seat with min A to support LLE while scooting back/forward on seat in long sit position but supervision for actual squat pivot transfer wheelchair <> car simulator, and bed mobility with supervision using leg lifter. Continued discharge planning for ramp and actual car transfer with mother's car. Patient left supine in bed with all needs in reach.    Therapy Documentation Precautions:  Precautions Precautions: Posterior Hip, Fall Precaution Booklet Issued: Yes (comment) Precaution Comments: cervical brace has been removed, post hip prec on L Restrictions Weight Bearing Restrictions: Yes RLE Weight Bearing: Weight bearing as tolerated LLE Weight Bearing: Non weight bearing Pain: Pain Assessment Pain Assessment: 0-10 Pain Score: 1  Pain Type: Acute pain Pain Location: Leg Pain Orientation: Left Pain Descriptors / Indicators: Aching Pain Onset: On-going Pain Intervention(s): Repositioned  See Function Navigator for Current Functional Status.   Therapy/Group: Individual Therapy  Kerney ElbeVarner, Rafal Archuleta A 07/21/2015, 10:42 AM

## 2015-07-21 NOTE — Progress Notes (Signed)
Occupational Therapy Session Note  Patient Details  Name: Elizabeth SicklesJasmine Lechuga MRN: [id number] Date of Birth: [birth year]  Today's Date: 07/21/2015 OT Individual Time: 0900-1000 OT Individual Time Calculation (min): 60 min    Short Term Goals: Week 1:   STG=LTG secondary to ELOS  Skilled Therapeutic Interventions/Progress Updates:    Pt resting in bed upon arrival.  Pt requested to use the BSC prior to bathing and dressing at w/c level.  Pt performed bed>BSC>w/c transfers (squat pivot/scoot) at supervision level.  Pt completed all bathing and dressing tasks at supervision level.  Pt requires more than a reasonable amount of time to complete all tasks. Focus on activity tolerance, functional transfers, BADL retraining, and safety awareness to increase independence with BADLs.   Therapy Documentation Precautions:  Precautions Precautions: Posterior Hip, Fall Precaution Booklet Issued: Yes (comment) Precaution Comments: cervical brace has been removed, post hip prec on L Restrictions Weight Bearing Restrictions: Yes RLE Weight Bearing: Weight bearing as tolerated LLE Weight Bearing: Non weight bearing  Pain: Pain Assessment Pain Assessment: 0-10 Pain Score: 1  Pain Type: Acute pain Pain Location: Leg Pain Orientation: Left Pain Descriptors / Indicators: Aching Pain Onset: On-going Pain Intervention(s): Repositioned  See Function Navigator for Current Functional Status.   Therapy/Group: Individual Therapy  Rich BraveLanier, Lynnita Somma Chappell 07/21/2015, 11:34 AM

## 2015-07-22 ENCOUNTER — Inpatient Hospital Stay (HOSPITAL_COMMUNITY): Payer: BLUE CROSS/BLUE SHIELD | Admitting: Physical Therapy

## 2015-07-22 ENCOUNTER — Inpatient Hospital Stay (HOSPITAL_COMMUNITY): Payer: BLUE CROSS/BLUE SHIELD | Admitting: Speech Pathology

## 2015-07-22 ENCOUNTER — Inpatient Hospital Stay (HOSPITAL_COMMUNITY): Payer: Self-pay

## 2015-07-22 DIAGNOSIS — F4311 Post-traumatic stress disorder, acute: Secondary | ICD-10-CM | POA: Insufficient documentation

## 2015-07-22 DIAGNOSIS — F4321 Adjustment disorder with depressed mood: Secondary | ICD-10-CM | POA: Insufficient documentation

## 2015-07-22 LAB — PROTIME-INR
INR: 1.92 — AB (ref 0.00–1.49)
PROTHROMBIN TIME: 21.9 s — AB (ref 11.6–15.2)

## 2015-07-22 MED ORDER — WARFARIN SODIUM 5 MG PO TABS
10.0000 mg | ORAL_TABLET | Freq: Once | ORAL | Status: AC
  Administered 2015-07-22: 10 mg via ORAL
  Filled 2015-07-22: qty 2
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
## 2015-07-22 NOTE — Progress Notes (Signed)
Occupational Therapy Session Note  Patient Details  Name: [name] MRN: [id number] Date of Birth: August 07, 1990  Today's Date: 07/22/2015 OT Individual Time: 0900-1000 OT Individual Time Calculation (min): 60 min    Short Term Goals: Week 1:   STG=LTG secondary to ELOS  Skilled Therapeutic Interventions/Progress Updates:    Pt resting in bed upon arrival.  Pt engaged in BADL retraining including bathing and dressing at w/c level employing lateral leans to bathe buttocks.  Pt is independent with directing care regarding w/c setup/placement and requesting assistance with obtaining items PRN. Pt requires more than a reasonable amount of time to complete all tasks and is very thorough with her BADLs.  Focus on activity tolerance, functional transfers, and safety awareness.  Discussed ongoing discharge plans and home safety.   Therapy Documentation Precautions:  Precautions Precautions: Posterior Hip, Fall Precaution Booklet Issued: Yes (comment) Precaution Comments: cervical brace has been removed, post hip prec on L Restrictions Weight Bearing Restrictions: Yes RLE Weight Bearing: Weight bearing as tolerated LLE Weight Bearing: Non weight bearing  Pain: Pain Assessment Pain Assessment: No/denies pain  See Function Navigator for Current Functional Status.   Therapy/Group: Individual Therapy  [name], [name] 07/22/2015, 12:38 PM
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
## 2015-07-22 NOTE — Progress Notes (Signed)
Speech Language Pathology Daily Session Note  Patient Details  Name: [name] MRN: [id number] Date of Birth: 11-10-1990  Today's Date: 07/22/2015 SLP Individual Time: 1300-1330 SLP Individual Time Calculation (min): 30 min  Short Term Goals: Week 1: SLP Short Term Goal 1 (Week 1): STGs=LTGs  Skilled Therapeutic Interventions: Skilled treatment session focused on cognition goals. SLP facilitated session by providing Mod A verbal cues for use of external aides to retreive information regarding therapies from printed schedule. Pt appeared to require increased assistance as a result of crying from pain in leg. Nursing in with medication initially however pain increased at end of session and nursing in to further treat pain. ST offered emotional support and encouragement. Pt left in bed with all needs within reach. Continue current plan of care.   Function:    Cognition Comprehension Comprehension assist level: Follows basic conversation/direction with no assist  Expression   Expression assist level: Expresses basic needs/ideas: With no assist  Social Interaction Social Interaction assist level: Interacts appropriately 75 - 89% of the time - Needs redirection for appropriate language or to initiate interaction.  Problem Solving Problem solving assist level: Solves basic 90% of the time/requires cueing < 10% of the time  Memory Memory assist level: Recognizes or recalls 90% of the time/requires cueing < 10% of the time    Pain Pain Assessment Pain Assessment: No/denies pain  Therapy/Group: Individual Therapy  [name] 07/22/2015, 3:54 PM

## 2015-07-23 ENCOUNTER — Inpatient Hospital Stay (HOSPITAL_COMMUNITY): Payer: BLUE CROSS/BLUE SHIELD | Admitting: Physical Therapy

## 2015-07-23 ENCOUNTER — Inpatient Hospital Stay (HOSPITAL_COMMUNITY): Payer: BLUE CROSS/BLUE SHIELD | Admitting: Speech Pathology

## 2015-07-23 ENCOUNTER — Inpatient Hospital Stay (HOSPITAL_COMMUNITY): Payer: Self-pay | Admitting: Physical Therapy

## 2015-07-23 ENCOUNTER — Inpatient Hospital Stay (HOSPITAL_COMMUNITY): Payer: Self-pay | Admitting: Occupational Therapy

## 2015-07-23 LAB — PROTIME-INR
INR: 2.19 — AB (ref 0.00–1.49)
Prothrombin Time: 24.2 seconds — ABNORMAL HIGH (ref 11.6–15.2)

## 2015-07-23 MED ORDER — WARFARIN SODIUM 7.5 MG PO TABS
7.5000 mg | ORAL_TABLET | Freq: Once | ORAL | Status: AC
Start: 2015-07-23 — End: 2015-07-23
  Administered 2015-07-23: 7.5 mg via ORAL
  Filled 2015-07-23: qty 1

## 2015-07-23 NOTE — Progress Notes (Signed)
Occupational Therapy Session Note & Discharge Summary  Patient Details  Name: [name] MRN: [id number] Date of Birth: 1990-06-18  SESSION NOTE Today's Date: 07/23/2015 OT Individual Time: 1300-1400 OT Individual Time Calculation (min): 60 min  Grad day! Pt with 5/10 complaints of pain in ribs and back and 1/10 complaints of pain in LLE; monitored this during session & RN present for medication management. Pt found supine in bed. Pt engaged in bed mobility and performed EOB to w/c squat pivot transfer with supervision. Pt then gathered clothing and performed UB/LB bathing & grooming tasks seated in w/c at sink. Pt performed drop arm BSC transfer at distant supervision level and toileting tasks with set-up assist. Pt transferred to EOB for UB/LB dressing in seated position. Therapist assisted to supine position.  Pt takes increased time to complete tasks, but does so safely. At end of session, left pt supine in bed with all needs within reach.   ------------------------------------------------------------------------------------------------------------------  DISCHARGE SUMMARY  Patient has met 6 of 7 long term goals due to improved activity tolerance, improved balance, postural control, ability to compensate for deficits, functional use of  LEFT lower extremity, improved attention, improved awareness and improved coordination.  Patient to discharge at overall supervision to mod I level.  Patient's mother plans to be present to assist/supervise pt with ADLs and functional transfers, pt reports that her mom will be preparing meals and that she will not be able to meal prep at home.   Reasons goals not met: Pt did not meet simple meal prep goal. Pt reports that she does not plan on cooking anything and she did not want to work on this goal.   Recommendation: Patient will benefit from ongoing skilled OT services in home health setting to continue to advance functional skills in the area of BADL,  iADL and Reduce care partner burden.  Equipment: drop arm BSC  Reasons for discharge: treatment goals met and discharge from hospital  Patient/family agrees with progress made and goals achieved: Yes  OT Discharge Precautions/Restrictions  Precautions Precautions: Posterior Hip;Fall Precaution Comments: cervical brace has been removed, post hip prec on L Restrictions Weight Bearing Restrictions: Yes RLE Weight Bearing: Weight bearing as tolerated LLE Weight Bearing: Non weight bearing  ADL - See Function  Vision/Perception  Vision- History Baseline Vision/History: No visual deficits Patient Visual Report: No change from baseline   Cognition Overall Cognitive Status: Within Functional Limits for tasks assessed Arousal/Alertness: Awake/alert Orientation Level: Oriented X4 Attention: Alternating Sustained Attention: Appears intact Sustained Attention Impairment: Functional basic;Verbal basic Alternating Attention: Appears intact Memory: Appears intact Awareness: Appears intact Problem Solving: Appears intact Safety/Judgment: Appears intact   Sensation Sensation Light Touch: Impaired Detail Light Touch Impaired Details: Impaired LLE Hot/Cold: Appears Intact Proprioception: Appears Intact Additional Comments: c/o numbness in great toe LLE Coordination Gross Motor Movements are Fluid and Coordinated: No Fine Motor Movements are Fluid and Coordinated: Yes Coordination and Movement Description: limited by precautions, pain, and RLE weakness   Motor  Motor Motor: Within Functional Limits Motor - Discharge Observations: limited by precautions and weakness   Mobility  Bed Mobility Bed Mobility: Supine to Sit;Sit to Supine Supine to Sit: 6: Modified independent (Device/Increase time);Other (comment) (leg lifter) Sit to Supine: 6: Modified independent (Device/Increase time);Other (comment) (leg lifter)   Trunk/Postural Assessment  Cervical Assessment Cervical  Assessment: Within Functional Limits Thoracic Assessment Thoracic Assessment: Within Functional Limits Lumbar Assessment Lumbar Assessment: Within Functional Limits Postural Control Postural Control: Within Functional Limits   Balance Dynamic Sitting  Balance Dynamic Sitting - Balance Support: During functional activity;Feet supported Dynamic Sitting - Level of Assistance: 6: Modified independent (Device/Increase time)   Extremity/Trunk Assessment RUE Assessment RUE Assessment: Within Functional Limits LUE Assessment LUE Assessment: Within Functional Limits  See Function Navigator for Current Functional Status.  [name] , MS, OTR/L, CLT  07/23/2015, 2:58 PM

## 2015-07-23 NOTE — Progress Notes (Signed)
Speech Language Pathology Discharge Summary  Patient Details  Name: [name] MRN: [id number] Date of Birth: Apr 11, 1990  Today's Date: 07/23/2015 SLP Individual Time: 1000-1100 SLP Individual Time Calculation (min): 60 min   Skilled Therapeutic Interventions:  Skilled treatment session focused on cognition goals. Pt given set amount of money and accompanied ST to gift shop with shopping requires on items to purchase. Pt able to recall navigation information and sought additional help from general hospital staff for locating areas that weren't readily visible. Pt with Mod I when problem solving items to purchase and stayed within given money amount. Pt left at nursing station to interact with staff (per her request). Pt has successfully met skilled ST goals.     Patient has met 2 of 2 long term goals.  Patient to discharge at overall Modified Independent level.  Reasons goals not met:     Clinical Impression/Discharge Summary: Pt has made progress in skilled treatment sessions as evidenced by meeting 2/2 goals. Pt is currently Mod I with complex problems in daily situations. Pt no longer requires inpatient ST services as she has met goals.   Care Partner:  Caregiver Able to Provide Assistance: Yes  Type of Caregiver Assistance: Cognitive (Mod I)  Recommendation:  None      Equipment: N/A   Reasons for discharge: Treatment goals met   Patient/Family Agrees with Progress Made and Goals Achieved: Yes   Function:   Cognition Comprehension Comprehension assist level: Follows basic conversation/direction with no assist  Expression   Expression assist level: Expresses basic needs/ideas: With no assist  Social Interaction Social Interaction assist level: Interacts appropriately 75 - 89% of the time - Needs redirection for appropriate language or to initiate interaction.  Problem Solving Problem solving assist level: Solves basic problems with no assist  Memory Memory assist level:  More than reasonable amount of time   [name] 07/23/2015, 12:30 PM
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
## 2015-07-23 NOTE — Progress Notes (Signed)
Physical Therapy Session Note  Patient Details  Name: [name] MRN: [id number] Date of Birth: 07-Jun-1990  Today's Date: 07/23/2015 PT Individual Time: 1510-1539 PT Individual Time Calculation (min): 29 min   Short Term Goals: Week 1:  PT Short Term Goal 1 (Week 1): = LTGs due to anticipated LOS  Skilled Therapeutic Interventions/Progress Updates:    Pt received resting in bed, no c/o pain and agreeable to therapy session.  Mother present and agreeable to education for real car transfer.  PT propelled pt to [place] entrance for time management.  Pt performed lateral scoot transfer from bed>w/c with supervision.  PT instructed pt and mother in car transfer with supervision and verbal cues for sequencing.  Pt returned to room at end of session and transferred back to bed with supervision and increased time.  Pt left with call bell in reach and needs met.   Therapy Documentation Precautions:  Precautions Precautions: Posterior Hip, Fall Precaution Booklet Issued: Yes (comment) Precaution Comments: cervical brace has been removed, post hip prec on L Restrictions Weight Bearing Restrictions: Yes RLE Weight Bearing: Weight bearing as tolerated LLE Weight Bearing: Non weight bearing General:   Vital Signs: Therapy Vitals Temp: 98.5 F (36.9 C) Temp Source: Oral Pulse Rate: 66 Resp: 18 BP: 117/75 mmHg Patient Position (if appropriate): Lying Oxygen Therapy SpO2: 100 % O2 Device: Not Delivered Pain: Pain Assessment Pain Assessment: 0-10 Pain Score: 2  Pain Type: Surgical pain Pain Location: Ankle Pain Orientation: Left Pain Descriptors / Indicators: Throbbing;Tender Pain Onset: Gradual Patients Stated Pain Goal: 2 Pain Intervention(s): Medication (See eMAR);Repositioned Multiple Pain Sites: No Mobility:   Locomotion :    Trunk/Postural Assessment :    Balance:   Exercises:   Other Treatments:     See Function Navigator for Current Functional  Status.   Therapy/Group: Individual Therapy  [name] 07/23/2015, 4:53 PM

## 2015-07-23 NOTE — Progress Notes (Signed)
Physical Therapy Discharge Summary  Patient Details  Name: Amaiya Scruton MRN: [id number] Date of Birth: 1990-08-07  Today's Date: 07/23/2015 PT Individual Time: 1105-1210 PT Individual Time Calculation (min): 65 min    Patient has met 8 of 8 long term goals due to improved activity tolerance, improved balance, improved postural control, increased strength, increased range of motion, decreased pain and ability to compensate for deficits.  Patient to discharge at a wheelchair level Supervision. Patient currently demonstrating behaviors consistent with [place].  Patient's care partner is independent to provide the necessary physical and cognitive assistance at discharge.  Reasons goals not met: NA  Recommendation:  Patient will benefit from ongoing skilled PT services in home health setting to continue to advance safe functional mobility, address ongoing impairments in strength, balance, activity tolerance, ROM, and minimize fall risk.  Equipment: manual wheelchair with elevating leg rests  Reasons for discharge: treatment goals met and discharge from hospital  Patient/family agrees with progress made and goals achieved: Yes  Skilled Therapeutic Intervention Session focused on wheelchair propulsion using BUE in controlled and home environments with mod I and community environment including propelling up/down ramp with supervision, wheelchair parts management with setup assist as patient unable to pick leg rests up from floor due to hip precautions with education provided on placing leg rests within reach when donning/doffing leg rests at home to increase independence, squat pivot transfers wheelchair <> bed using leg lifter to reposition LLE and lift LLE on/off leg rest with supervision, bed mobility on regular bed in ADL apartment using leg lifter with more than reasonable time and mod I, and supine HEP for strengthening and ROM: R ankle pumps x 20, R heel slides x 10, assisted R hip  abduction/adduction x 10, assisted RLE SLR x 10, and glute sets x 20. Patient with persistent R hip weakness but knee and ankle WFL. Patient's mother not present for session as she is awaiting family members to build ramp for home entry. Patient with no further questions/concerns regarding discharge.   PT Discharge Precautions/Restrictions Precautions Precautions: Posterior Hip;Fall Precaution Comments: cervical brace has been removed, post hip prec on L Restrictions Weight Bearing Restrictions: Yes RLE Weight Bearing: Weight bearing as tolerated LLE Weight Bearing: Non weight bearing Pain  Reports back "discomfort" due to rib fx, repositioned Vision/Perception   No change from baseline  Cognition Overall Cognitive Status: Within Functional Limits for tasks assessed Arousal/Alertness: Awake/alert Orientation Level: Oriented X4 Attention: Alternating Sustained Attention: Appears intact Sustained Attention Impairment: Functional basic;Verbal basic Alternating Attention: Appears intact Memory: Appears intact Awareness: Appears intact Problem Solving: Appears intact Safety/Judgment: Appears intact [place] [employer] Scales of Cognitive Functioning: Purposeful/appropriate Sensation Sensation Light Touch: Impaired Detail Light Touch Impaired Details: Impaired LLE Hot/Cold: Appears Intact Proprioception: Appears Intact Additional Comments: c/o numbness in great toe LLE Coordination Gross Motor Movements are Fluid and Coordinated: No Fine Motor Movements are Fluid and Coordinated: Yes Coordination and Movement Description: limited by precautions, pain, and RLE weakness Motor  Motor Motor: Within Functional Limits Motor - Discharge Observations: limited by precautions and weakness  Mobility Bed Mobility Bed Mobility: Supine to Sit;Sit to Supine Supine to Sit: 6: Modified independent (Device/Increase time);Other (comment) (leg lifter) Sit to Supine: 6: Modified independent  (Device/Increase time);Other (comment) (leg lifter) Transfers Squat Pivot Transfers: 5: Supervision Lateral/Scoot Transfers: 5: Supervision;Other (comment) (leg lifter) Locomotion  Ambulation Ambulation: No Gait Gait: No Stairs / Additional Locomotion Stairs: No Wheelchair Mobility Wheelchair Mobility: Yes Wheelchair Assistance: 6: Modified independent (Device/Increase time)  Wheelchair Propulsion: Both upper extremities Wheelchair Parts Management: Independent;Other (comment) (setup due to posterior hip precautions) Distance: 300 ft  Trunk/Postural Assessment  Cervical Assessment Cervical Assessment: Within Functional Limits Thoracic Assessment Thoracic Assessment: Within Functional Limits Lumbar Assessment Lumbar Assessment: Within Functional Limits Postural Control Postural Control: Within Functional Limits  Balance Dynamic Sitting Balance Dynamic Sitting - Balance Support: During functional activity;Feet supported Dynamic Sitting - Level of Assistance: 6: Modified independent (Device/Increase time) Extremity Assessment  RUE Assessment RUE Assessment: Within Functional Limits LUE Assessment LUE Assessment: Within Functional Limits RLE Assessment RLE Assessment: Exceptions to [facility] RLE Strength RLE Overall Strength: Deficits RLE Overall Strength Comments: 2-/5 hip flexion, 5/5 knee flexion/extension and ankle DF LLE Assessment LLE Assessment: Exceptions to WFL LLE AROM (degrees) Overall AROM Left Lower Extremity: Deficits;Due to pain;Due to decreased strength;Due to precautions LLE Strength LLE Overall Strength: Deficits;Due to pain;Due to precautions   See Function Navigator for Current Functional Status.  [name] 07/23/2015, 9:33 AM
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
## 2015-07-23 NOTE — Progress Notes (Signed)
0900:  Pt tearful this AM and again early afternoon with c/o increased lt ankle pain. Deatra Inaan Angiulli PAC aware. Oxy. IR 10mg  given with scheduled meds; effective.  Brighter affect when family in room. Monitor.

## 2015-07-23 NOTE — Discharge Summary (Signed)
Discharge summary job # [id number]

## 2015-07-24 LAB — PROTIME-INR
INR: 2.23 — ABNORMAL HIGH (ref 0.00–1.49)
PROTHROMBIN TIME: 24.5 s — AB (ref 11.6–15.2)

## 2015-07-24 MED ORDER — OXYCODONE HCL 10 MG PO TABS: 10.0000 mg | ORAL_TABLET | Freq: Four times a day (QID) | ORAL | Status: DC | PRN

## 2015-07-24 MED ORDER — WARFARIN SODIUM 5 MG PO TABS: 10.0000 mg | ORAL_TABLET | Freq: Once | ORAL | Status: DC

## 2015-07-24 MED ORDER — OXYCODONE HCL ER 20 MG PO T12A: 20.0000 mg | EXTENDED_RELEASE_TABLET | Freq: Two times a day (BID) | ORAL | Status: DC

## 2015-07-24 MED ORDER — MUSCLE RUB 10-15 % EX CREA: 1.0000 "application " | TOPICAL_CREAM | Freq: Two times a day (BID) | CUTANEOUS | Status: DC

## 2015-07-24 MED ORDER — WARFARIN SODIUM 5 MG PO TABS: ORAL_TABLET | ORAL | Status: DC

## 2015-07-24 MED ORDER — PANTOPRAZOLE SODIUM 40 MG PO TBEC: 40.0000 mg | DELAYED_RELEASE_TABLET | Freq: Two times a day (BID) | ORAL | Status: DC

## 2015-07-24 MED ORDER — POLYETHYLENE GLYCOL 3350 17 G PO PACK: 17.0000 g | PACK | Freq: Every day | ORAL | Status: DC

## 2015-07-24 MED ORDER — DOCUSATE SODIUM 100 MG PO CAPS: 100.0000 mg | ORAL_CAPSULE | Freq: Two times a day (BID) | ORAL | Status: DC

## 2015-07-24 MED ORDER — METHOCARBAMOL 500 MG PO TABS: 500.0000 mg | ORAL_TABLET | Freq: Four times a day (QID) | ORAL | Status: DC | PRN

## 2015-07-24 MED ORDER — ACETAMINOPHEN 325 MG PO TABS: 325.0000 mg | ORAL_TABLET | ORAL | Status: DC | PRN

## 2015-07-24 MED ORDER — TRAMADOL HCL 50 MG PO TABS: 50.0000 mg | ORAL_TABLET | Freq: Four times a day (QID) | ORAL | Status: DC

## 2015-07-24 MED FILL — WARFARIN SODIUM 5 MG TABLET: 5 | 30 days supply | Qty: 60 | Fill #0

## 2015-07-24 MED FILL — OxyCONTIN 20 MG T12A: 20 | 15 days supply | Qty: 30 | Fill #0

## 2015-07-24 MED FILL — METHOCARBAMOL 500 MG TABLET: 500 | 25 days supply | Qty: 100 | Fill #0

## 2015-07-24 MED FILL — oxyCODONE HCL 10 MG TABS: 10 | 15 days supply | Qty: 60 | Fill #0

## 2015-07-24 MED FILL — PANTOPRAZOLE SOD DR 40 MG T: 40 | 30 days supply | Qty: 60 | Fill #0

## 2015-07-24 MED FILL — traMADol HCL 50 MG TABS: 50 | 19 days supply | Qty: 150 | Fill #0

## 2015-07-24 NOTE — Progress Notes (Signed)
Social Work  Discharge Note  The overall goal for the admission was met for:   Discharge location: Yes - home with mother and family to provide 24/7 supervision  Length of Stay: Yes - 10 days  Discharge activity level: Yes - supervision w/c level  Home/community participation: Yes  Services provided included: MD, RD, PT, OT, SLP, RN, TR, Pharmacy, [facility]: Private Insurance: [employer]  Follow-up services arranged: Home Health: Therapist, sports, PT, OT via [facility] ([place]), DME: 16x16 lightweight w/c with ELRs, cushion, semi-electric hospital bed and drop arm commode via [place] and Patient/Family has no preference for HH/DME agencies  Comments (or additional information):  Patient/Family verbalized understanding of follow-up arrangements: Yes  Individual responsible for coordination of the follow-up plan: pt/mother  Confirmed correct DME delivered: Elber Galyean 07/24/2015    Eliska Hamil
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
## 2015-07-24 NOTE — Progress Notes (Signed)
Patient and mother received discharge instructions from Pam Love, [name] with verbal understanding. Patient discharged to home with mother and belongings. 
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
## 2015-07-24 NOTE — Discharge Instructions (Signed)
Inpatient Rehab Discharge Instructions  Margart SicklesJasmine Minami Discharge date and time: 07/24/15   Activities/Precautions/ Functional Status: Activity: No weight on left leg. Continue to follow posterior hip precautions Diet: regular diet Wound Care: Wash with soap and water. Keep wound clean and dry--no soaps, ointments or creams.  Contact Dr. Carola FrostHandy if you develop any drainage (pus/bloody), redness, increase in pain or fever/chills.    Functional status:  ___ No restrictions     ___ Walk up steps independently _X__ 24/7 supervision/assistance   ___ Walk up steps with assistance.  ___ Intermittent supervision/assistance  ___ Bathe/dress independently ___ Walk with walker     _x__ Bathe/dress with assistance ___ Walk Independently    ___ Shower independently ___ Walk with assistance    ___ Shower with assistance _X__ No alcohol     ___ Return to work/school ________    COMMUNITY REFERRALS UPON DISCHARGE:    Home Health:   PT     OT      RN                     Agency:  [facility]     Phone:  [phone]   Medical Equipment/Items Ordered: wheelchair, cushion, hospital bed, commode                                                     Agency/Supplier:  [facility] @ [phone]     Special Instructions: HHRN check INR 07/29/2015 RESULTS TO [facility]GIULL[facility] [phone] UNTIL 08/23/2015 AND STOP   My questions have been answered and I understand these instructions. I will adhere to these goals and the provided educational materials after my discharge from the hospital.  Patient/Caregiver Signature _______________________________ Date __________  Clinician Signature _______________________________________ Date __________  Please bring this form and your medication list with you to all your follow-up doctor's appointments.
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
## 2015-07-25 ENCOUNTER — Inpatient Hospital Stay (HOSPITAL_COMMUNITY): Payer: Self-pay | Admitting: Physical Therapy

## 2015-07-26 ENCOUNTER — Inpatient Hospital Stay (HOSPITAL_COMMUNITY): Payer: Self-pay | Admitting: Physical Therapy

## 2015-07-29 ENCOUNTER — Telehealth: Payer: Self-pay | Admitting: *Deleted

## 2015-07-29 NOTE — Telephone Encounter (Signed)
Transitional Care Questions  Questions for our staff to ask patients on Transitional care 48 hour phone call:   1. Are you/is patient experiencing any problems since coming home? No Are there any questions regarding any aspect of care? No  2. Are there any questions regarding medications administration/dosing? No  Are meds being taken as prescribed? Yes  Patient should review meds with caller to confirm   3. Have there been any falls? No  4. Has Home Health been to the house and/or have they contacted you? Yes  If not, have you tried to contact them? Can we help you contact them?   5. Are bowels and bladder emptying properly? Yes  Are there any unexpected incontinence issues? If applicable, is patient following bowel/bladder programs?  6. Any fevers, problems with breathing, unexpected pain? No   7. Are there any skin problems or new areas of breakdown? No  8. Has the patient/family member arranged specialty MD follow up (ie cardiology/neurology/renal/surgical/etc)? Yes Can we help arrange? No   9. Does the patient need any other services or support that we can help arrange? No  10. Are caregivers following through as expected in assisting the patient? Yes   11. Has the patient quit smoking, drinking alcohol, or using drugs as recommended? Yes  Patients appointment confirmed, follow up packet sent

## 2015-07-29 NOTE — Telephone Encounter (Signed)
I tried calling homehealth nurse back to give verbal orders, an area code was not provided on message log. Tried [phone] and rec'd message that number was no longer in service

## 2015-08-04 ENCOUNTER — Telehealth: Payer: Self-pay | Admitting: Physical Medicine & Rehabilitation

## 2015-08-04 NOTE — Telephone Encounter (Signed)
Joyce GrossKay with [facility] needs to know when patients next INR should be.  Please call her at [phone].

## 2015-08-04 NOTE — Telephone Encounter (Signed)
Spoke with Joyce GrossKay and told her that per Dan's discharge summary that she needed to call him with PT/INR results [phone].

## 2015-08-04 NOTE — Telephone Encounter (Signed)
Please advise on INR
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
## 2015-08-06 ENCOUNTER — Encounter: Payer: Self-pay | Admitting: Physical Medicine & Rehabilitation

## 2015-08-06 ENCOUNTER — Encounter
Payer: BLUE CROSS/BLUE SHIELD | Attending: Physical Medicine & Rehabilitation | Admitting: Physical Medicine & Rehabilitation

## 2015-08-15 NOTE — Progress Notes (Signed)
  Radiation Oncology         (336) [phone] ________________________________  Name: Hermena Flaten MRN: [id number]  Date: 07/06/2015  DOB: Aug 17, 1990  SIMULATION AND TREATMENT PLANNING NOTE  DIAGNOSIS:     ICD-9-CM ICD-10-CM   1. Heterotopic calcification, postoperative 728.13 M61.49      Site:  Left hip  NARRATIVE:  The patient was brought to the linear accelerator suite .  Identity was confirmed.  All relevant records and images related to the planned course of therapy were reviewed.   Written consent to proceed with treatment was confirmed which was freely given after reviewing the details related to the planned course of therapy had been reviewed with the patient.  Then, the patient was set-up in a stable reproducible  supine position for radiation therapy.  CT images were obtained.  Surface markings were placed.    A total of 2 fields were designed which relate to the designed radiation treatment fields. Each of these fields/ complex treatment devices will be used on a daily basis during the radiation course. I have requested : calculation at mid-depth.   PLAN:  The patient will receive 7 Gy in 1 fractions.  ________________________________   Radene Gunning, MD, PhD

## 2015-08-15 NOTE — Addendum Note (Signed)
Encounter addended by: Dorothy Puffer, MD on: 08/15/2015  8:37 PM<BR>     Documentation filed: Notes Section

## 2015-08-24 ENCOUNTER — Telehealth: Payer: Self-pay | Admitting: *Deleted

## 2015-08-24 NOTE — Telephone Encounter (Signed)
Pt's mother called stating that her daughter needs refills on her medication.  I called the family back and left a message asking for them to call back and make an appointment with Dr. Allena Katz.  They had cancelled a previous appt and no-showed the rescheduled appt.

## 2015-08-27 ENCOUNTER — Encounter: Payer: Self-pay | Admitting: Physical Medicine & Rehabilitation

## 2015-08-27 ENCOUNTER — Encounter
Payer: BLUE CROSS/BLUE SHIELD | Attending: Physical Medicine & Rehabilitation | Admitting: Physical Medicine & Rehabilitation

## 2015-08-27 VITALS — BP 151/123 | HR 63

## 2015-08-27 DIAGNOSIS — G479 Sleep disorder, unspecified: Secondary | ICD-10-CM | POA: Diagnosis not present

## 2015-08-27 DIAGNOSIS — S6991XA Unspecified injury of right wrist, hand and finger(s), initial encounter: Secondary | ICD-10-CM | POA: Diagnosis not present

## 2015-08-27 DIAGNOSIS — T1490XA Injury, unspecified, initial encounter: Secondary | ICD-10-CM

## 2015-08-27 DIAGNOSIS — T1490 Injury, unspecified: Secondary | ICD-10-CM

## 2015-08-27 DIAGNOSIS — X58XXXA Exposure to other specified factors, initial encounter: Secondary | ICD-10-CM | POA: Diagnosis not present

## 2015-08-27 DIAGNOSIS — G8918 Other acute postprocedural pain: Secondary | ICD-10-CM | POA: Diagnosis not present

## 2015-08-27 MED ORDER — TRAZODONE HCL 50 MG PO TABS: 50.0000 mg | ORAL_TABLET | Freq: Every evening | ORAL | 1 refills | Status: DC | PRN
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
## 2015-09-07 DIAGNOSIS — S82142F Displaced bicondylar fracture of left tibia, subsequent encounter for open fracture type IIIA, IIIB, or IIIC with routine healing: Secondary | ICD-10-CM | POA: Diagnosis not present

## 2015-09-07 DIAGNOSIS — S32810D Multiple fractures of pelvis with stable disruption of pelvic ring, subsequent encounter for fracture with routine healing: Secondary | ICD-10-CM

## 2015-09-07 DIAGNOSIS — S8252XD Displaced fracture of medial malleolus of left tibia, subsequent encounter for closed fracture with routine healing: Secondary | ICD-10-CM | POA: Diagnosis not present

## 2015-09-07 DIAGNOSIS — S2241XD Multiple fractures of ribs, right side, subsequent encounter for fracture with routine healing: Secondary | ICD-10-CM | POA: Diagnosis not present

## 2015-09-07 DIAGNOSIS — S3219XD Other fracture of sacrum, subsequent encounter for fracture with routine healing: Secondary | ICD-10-CM

## 2015-09-07 DIAGNOSIS — S82832F Other fracture of upper and lower end of left fibula, subsequent encounter for open fracture type IIIA, IIIB, or IIIC with routine healing: Secondary | ICD-10-CM | POA: Diagnosis not present

## 2015-09-23 ENCOUNTER — Ambulatory Visit (HOSPITAL_COMMUNITY)
Admission: RE | Admit: 2015-09-23 | Discharge: 2015-09-23 | Disposition: A | Discharge status: Home / Self Care | Payer: BLUE CROSS/BLUE SHIELD | Source: Ambulatory Visit | Attending: Physical Medicine & Rehabilitation | Admitting: Physical Medicine & Rehabilitation

## 2015-09-23 DIAGNOSIS — S6991XA Unspecified injury of right wrist, hand and finger(s), initial encounter: Secondary | ICD-10-CM

## 2015-09-23 DIAGNOSIS — M20021 Boutonniere deformity of right finger(s): Secondary | ICD-10-CM | POA: Diagnosis not present

## 2016-09-17 ENCOUNTER — Emergency Department (HOSPITAL_COMMUNITY)
Admission: EM | Admit: 2016-09-17 | Discharge: 2016-09-17 | Disposition: A | Discharge status: Home / Self Care | Payer: BLUE CROSS/BLUE SHIELD | Attending: Emergency Medicine | Admitting: Emergency Medicine

## 2016-09-17 ENCOUNTER — Emergency Department (HOSPITAL_COMMUNITY): Payer: BLUE CROSS/BLUE SHIELD

## 2016-09-17 DIAGNOSIS — F17200 Nicotine dependence, unspecified, uncomplicated: Secondary | ICD-10-CM | POA: Diagnosis not present

## 2016-09-17 DIAGNOSIS — Z79899 Other long term (current) drug therapy: Secondary | ICD-10-CM | POA: Diagnosis not present

## 2016-09-17 DIAGNOSIS — G8929 Other chronic pain: Secondary | ICD-10-CM | POA: Diagnosis not present

## 2016-09-17 DIAGNOSIS — M25572 Pain in left ankle and joints of left foot: Secondary | ICD-10-CM | POA: Diagnosis present

## 2016-09-17 DIAGNOSIS — M25562 Pain in left knee: Secondary | ICD-10-CM | POA: Insufficient documentation

## 2016-09-17 MED ORDER — MELOXICAM 15 MG PO TABS: 15.0000 mg | ORAL_TABLET | Freq: Every day | ORAL | 0 refills | Status: DC

## 2016-09-17 NOTE — ED Notes (Signed)
Declined W/C at D/C and was escorted to lobby by RN. 
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
## 2016-09-17 NOTE — Discharge Instructions (Signed)
Please read attached information regarding your condition and rice therapy. Wear ankle brace as directed. Take Mobic daily as needed for pain and inflammation. Return to ED for severe pain, additional injury, trouble walking, numbness,

## 2016-09-17 NOTE — ED Triage Notes (Signed)
Has knee and ankle pain that started a couple of days ago. Relieved by advil and sleep.
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
## 2017-03-04 IMAGING — DX DG CHEST 1V PORT
1 series · 1 of 1 positions shown · non-contrast
Comparison: July 03, 2015

CLINICAL DATA: Right pneumothorax.

EXAM:
PORTABLE CHEST 1 VIEW

[chest ap]
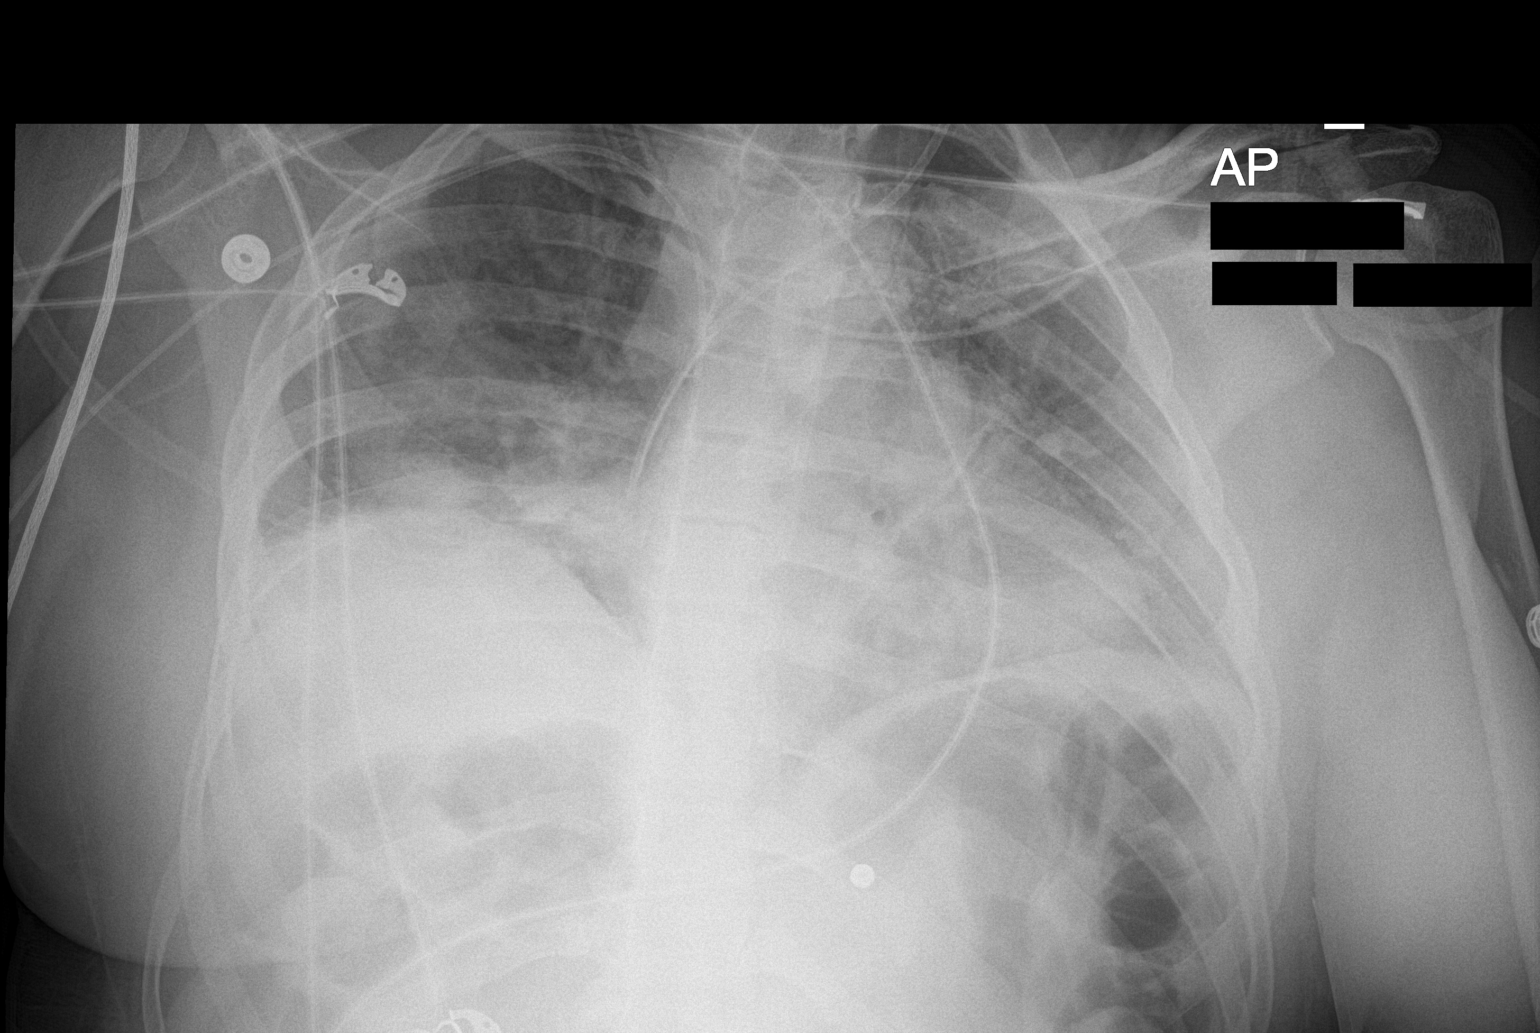

[1 of 1 positions shown; findings below may reference images not displayed]

FINDINGS: The study is limited due to the low volume portable technique. The
right PICC line terminates in the right side of the heart. The tip
is difficult to see on this study due to poor penetration. However,
based on the previous study, the tip was 2.8 cm below the caval
atrial junction. The left central line is stable. No pneumothorax.
The cardiomediastinal silhouette is stable. Pulmonary edema is more
prominent in the interval. There is no more focal opacity in the
right base.
IMPRESSION: 1. Stable support apparatus. The right PICC line terminates in the
right side of the heart an the patient may benefit from withdrawing
at 2.8 cm.
2. Mildly worsened edema.
3. Developing focal opacity in the right base. Recommend follow-up
to resolution.

## 2017-03-21 ENCOUNTER — Other Ambulatory Visit: Payer: Self-pay

## 2017-03-21 ENCOUNTER — Inpatient Hospital Stay (HOSPITAL_COMMUNITY): Payer: BLUE CROSS/BLUE SHIELD

## 2017-03-21 ENCOUNTER — Inpatient Hospital Stay (HOSPITAL_COMMUNITY)
Admission: AD | Admit: 2017-03-21 | Discharge: 2017-03-21 | Disposition: A | Discharge status: Home / Self Care | Payer: BLUE CROSS/BLUE SHIELD | Source: Ambulatory Visit | Attending: Obstetrics & Gynecology | Admitting: Obstetrics & Gynecology

## 2017-03-21 ENCOUNTER — Encounter (HOSPITAL_COMMUNITY): Payer: Self-pay | Admitting: *Deleted

## 2017-03-21 DIAGNOSIS — O9989 Other specified diseases and conditions complicating pregnancy, childbirth and the puerperium: Secondary | ICD-10-CM | POA: Diagnosis not present

## 2017-03-21 DIAGNOSIS — O26891 Other specified pregnancy related conditions, first trimester: Secondary | ICD-10-CM | POA: Insufficient documentation

## 2017-03-21 DIAGNOSIS — M549 Dorsalgia, unspecified: Secondary | ICD-10-CM

## 2017-03-21 DIAGNOSIS — Z3A01 Less than 8 weeks gestation of pregnancy: Secondary | ICD-10-CM | POA: Insufficient documentation

## 2017-03-21 DIAGNOSIS — Z3490 Encounter for supervision of normal pregnancy, unspecified, unspecified trimester: Secondary | ICD-10-CM

## 2017-03-21 DIAGNOSIS — R102 Pelvic and perineal pain: Secondary | ICD-10-CM | POA: Insufficient documentation

## 2017-03-21 DIAGNOSIS — O26899 Other specified pregnancy related conditions, unspecified trimester: Secondary | ICD-10-CM

## 2017-03-21 DIAGNOSIS — A5901 Trichomonal vulvovaginitis: Secondary | ICD-10-CM

## 2017-03-21 DIAGNOSIS — R109 Unspecified abdominal pain: Secondary | ICD-10-CM

## 2017-03-21 HISTORY — DX: Sickle-cell trait: D57.3

## 2017-03-21 LAB — URINALYSIS, ROUTINE W REFLEX MICROSCOPIC
Bilirubin Urine: NEGATIVE
Glucose, UA: NEGATIVE mg/dL
Hgb urine dipstick: NEGATIVE
Ketones, ur: NEGATIVE mg/dL
Nitrite: NEGATIVE
Protein, ur: NEGATIVE mg/dL
Specific Gravity, Urine: 1.012 (ref 1.005–1.030)
pH: 5 (ref 5.0–8.0)

## 2017-03-21 LAB — COMPREHENSIVE METABOLIC PANEL
ALBUMIN: 4.3 g/dL (ref 3.5–5.0)
ALT: 27 U/L (ref 14–54)
AST: 27 U/L (ref 15–41)
Alkaline Phosphatase: 54 U/L (ref 38–126)
Anion gap: 9 (ref 5–15)
BUN: 8 mg/dL (ref 6–20)
CHLORIDE: 104 mmol/L (ref 101–111)
CO2: 20 mmol/L — AB (ref 22–32)
CREATININE: 0.81 mg/dL (ref 0.44–1.00)
Calcium: 9.2 mg/dL (ref 8.9–10.3)
GFR calc Af Amer: >60 mL/min (ref >60)
GFR calc non Af Amer: >60 mL/min (ref >60)
Glucose, Bld: 87 mg/dL (ref 65–99)
Potassium: 4.1 mmol/L (ref 3.5–5.1)
SODIUM: 133 mmol/L — AB (ref 135–145)
Total Bilirubin: 1.6 mg/dL — ABNORMAL HIGH (ref 0.3–1.2)
Total Protein: 8 g/dL (ref 6.5–8.1)

## 2017-03-21 LAB — CBC
HEMATOCRIT: 27.4 % — AB (ref 36.0–46.0)
HEMOGLOBIN: 10 g/dL — AB (ref 12.0–15.0)
MCH: 29.9 pg (ref 26.0–34.0)
MCHC: 36.5 g/dL — ABNORMAL HIGH (ref 30.0–36.0)
MCV: 81.8 fL (ref 78.0–100.0)
Platelets: 376 10*3/uL (ref 150–400)
RBC: 3.35 MIL/uL — AB (ref 3.87–5.11)
RDW: 16.7 % — AB (ref 11.5–15.5)
WBC: 10.8 10*3/uL — AB (ref 4.0–10.5)

## 2017-03-21 LAB — HCG, QUANTITATIVE, PREGNANCY: HCG, BETA CHAIN, QUANT, S: 42556 m[IU]/mL — AB (ref <5)

## 2017-03-21 LAB — WET PREP, GENITAL
CLUE CELLS WET PREP: NONE SEEN
SPERM: NONE SEEN
Yeast Wet Prep HPF POC: NONE SEEN

## 2017-03-21 LAB — ABO/RH: ABO/RH(D): A POS

## 2017-03-21 LAB — POCT PREGNANCY, URINE: PREG TEST UR: POSITIVE — AB

## 2017-03-21 MED ORDER — METRONIDAZOLE 500 MG PO TABS
2000.0000 mg | ORAL_TABLET | Freq: Once | ORAL | Status: AC
  Administered 2017-03-21: 2000 mg via ORAL
  Filled 2017-03-21: qty 4

## 2017-03-21 NOTE — MAU Note (Signed)
Pain in back and lower stomach, started a couple days ago.  Mainly the back, that has been hurting for 2 wks.  Not taking anything for it, is scared.  +HPT early Feb.  When asking about infection screen, says "she hasn't been able to go to the bathroom"

## 2017-03-21 NOTE — Discharge Instructions (Signed)

## 2017-03-21 NOTE — MAU Provider Note (Addendum)
Patient  [name] is a 27 y.o. G1P0000 At 6 weeks and 5 days here with complaints of abdominal pain and back pain. She denies discharge, dysuria, N/V or vaginal bleeding.  History     CSN: [id number]  Arrival date and time: 03/21/17 1347   None     Chief Complaint  Patient presents with  . Abdominal Pain  . Back Pain   Abdominal Pain  This is a new problem. The current episode started in the past 7 days. The onset quality is sudden. The problem occurs constantly. The problem has been unchanged. The pain is located in the LLQ. The pain is at a severity of 5/10. The quality of the pain is cramping. The abdominal pain does not radiate. Associated symptoms include constipation and nausea. Pertinent negatives include no dysuria or vomiting. Associated symptoms comments: Last BM; she had a BM this morning and it was a small amount. Before today she had a BM a few days ago. Denies history of constipation except now in pregnancy. . Nothing aggravates the pain. The pain is relieved by nothing. She has tried nothing for the symptoms.  Back Pain  This is a new problem. The current episode started 1 to 4 weeks ago. Episode frequency: starts at night. The problem is unchanged. The pain is present in the lumbar spine. The pain is at a severity of 5/10. Associated symptoms include abdominal pain. Pertinent negatives include no dysuria. She has tried heat and bed rest for the symptoms.  The pain starts in the evening; she takes a bath and it feels better. She does not have a history of UTI.  OB History    Gravida Para Term Preterm AB Living   1 0 0 0 0     SAB TAB Ectopic Multiple Live Births   0 0 0          Past Medical History:  Diagnosis Date  . No pertinent past medical history   . Sickle cell trait [facility])     Past Surgical History:  Procedure Laterality Date  . APPLICATION OF WOUND VAC Left 06/30/2015   Procedure: APPLICATION OF WOUND VAC ;  Surgeon: Myrene Galas, MD;  Location: [facility] OR;   Service: Orthopedics;  Laterality: Left;  L ankle and L calf  . CLOSED REDUCTION NASAL FRACTURE N/A 06/28/2015   Procedure: CLOSED REDUCTION NASoSepto FRACTURE;  Surgeon: Newman Pies, MD;  [facility]: [facility] OR;  Service: ENT;  Laterality: N/A;  . ESOPHAGOSCOPY N/A 06/28/2015   Procedure: ESOPHAGOSCOPY;  Surgeon: Newman Pies, MD;  Location: [facility] OR;  Service: ENT;  Laterality: N/A;  . EXTERNAL FIXATION LEG Left 06/28/2015   Procedure: EXTERNAL FIXATION LEG;  Surgeon: Samson Frederic, MD;  Location: [facility] OR;  Service: Orthopedics;  Laterality: Left;  . EXTERNAL FIXATION LEG Left 07/03/2015   Procedure: REMOVAL OF EXTERNAL FIXATION LEFT LEG;  Surgeon: Myrene Galas, MD;  Location: [facility] OR;  Service: Orthopedics;  Laterality: Left;  . FEMUR IM NAIL Left 07/03/2015   Procedure: INTRAMEDULLARY (IM) NAIL FEMORAL;  Surgeon: Myrene Galas, MD;  Location: [facility] OR;  Service: Orthopedics;  Laterality: Left;  . I&D EXTREMITY Left 06/28/2015   Procedure: IRRIGATION AND DEBRIDEMENT EXTREMITY;  Surgeon: Samson Frederic, MD;  [facility] OR;  Service: Orthopedics;  Laterality: Left;  . I&D EXTREMITY Left 06/30/2015   Procedure: IRRIGATION AND DEBRIDEMENT 3A TIBIA PROXIMAL PLATEAU AND SHAFT WITH  EXTERNAL FIXATOR ADJUSTMENT;  Surgeon: Myrene Galas, MD;  [facility] OR;  Service: Orthopedics;  Laterality:  Left;  . I&D EXTREMITY Left 07/03/2015   Procedure: IRRIGATION AND DEBRIDEMENT LEFT OPEN TIBIA;  Surgeon: Myrene GalasMichael Handy, MD;  [facility];  Service: Orthopedics;  Laterality: Left;  . IRRIGATION AND DEBRIDEMENT FOOT Left 06/30/2015   Procedure: IRRIGATION AND DEBRIDEMENT OPEN 3A LEFT ANKLE JOINT DISLOCATION AND OPEN LEFT BIMALLEOUS FRACTURE ;  Surgeon: Myrene GalasMichael Handy, MD;  [facility];  Service: Orthopedics;  Laterality: Left;  . LARYNGOSCOPY AND BRONCHOSCOPY N/A 06/28/2015   Procedure: LARYNGOSCOPY AND BRONCHOSCOPY;  Surgeon: Newman PiesSu Teoh, MD;  [facility]: [facility] OR;  Service: ENT;  Laterality: N/A;  . NO PAST SURGERIES    . ORIF ACETABULAR FRACTURE N/A  07/03/2015   Procedure: OPEN REDUCTION INTERNAL FIXATION (ORIF) ACETABULAR FRACTURE;  Surgeon: Myrene GalasMichael Handy, MD;  [facility];  Service: Orthopedics;  Laterality: N/A;  . ORIF ANKLE FRACTURE Left 06/30/2015   Procedure: OPEN REDUCTION INTERNAL FIXATION (ORIF) ANTERIOR COLUMN ACETABULUM;  Surgeon: Myrene GalasMichael Handy, MD;  [facility];  Service: Orthopedics;  Laterality: Left;  . ORIF PELVIC FRACTURE Bilateral 06/30/2015   Procedure:  TRANS-SACRAL SCREWS ;  Surgeon: Myrene GalasMichael Handy, MD;  [facility];  Service: Orthopedics;  Laterality: Bilateral;  . ORIF TIBIA PLATEAU Left 07/03/2015   Procedure: OPEN REDUCTION INTERNAL FIXATION (ORIF) TIBIAL PLATEAU;  Surgeon: Myrene GalasMichael Handy, MD;  [facility];  Service: Orthopedics;  Laterality: Left;    No family history on file.  Social History   Tobacco Use  . Smoking status: Former Games developermoker  . Smokeless tobacco: Never Used  Substance Use Topics  . Alcohol use: No    Alcohol/week: 0.0 oz  . Drug use: No    Allergies: No Known Allergies  Medications Prior to Admission  Medication Sig Dispense Refill Last Dose  . acetaminophen (TYLENOL) 325 MG tablet Take 1-2 tablets (325-650 mg total) by mouth every 4 (four) hours as needed for mild pain.   Taking  . docusate sodium (COLACE) 100 MG capsule Take 1 capsule (100 mg total) by mouth 2 (two) times daily. 60 capsule 0 Taking  . meloxicam (MOBIC) 15 MG tablet Take 1 tablet (15 mg total) by mouth daily. 30 tablet 0   . Menthol-Methyl Salicylate (MUSCLE RUB) 10-15 % CREA Apply 1 application topically 2 (two) times daily after a meal. To lower extremity  0 Taking  . methocarbamol (ROBAXIN) 500 MG tablet Take 1 tablet (500 mg total) by mouth every 6 (six) hours as needed for muscle spasms. 100 tablet 0 Taking  . oxyCODONE (OXYCONTIN) 20 mg 12 hr tablet Take 1 tablet (20 mg total) by mouth every 12 (twelve) hours. 30 tablet 0 Taking  . oxyCODONE 10 MG TABS Take 1 tablet (10 mg total) by mouth every 6 (six) hours  as needed for severe pain. 60 tablet 0 Taking  . pantoprazole (PROTONIX) 40 MG tablet Take 1 tablet (40 mg total) by mouth 2 (two) times daily. 60 tablet 0 Taking  . polyethylene glycol (MIRALAX / GLYCOLAX) packet Take 17 g by mouth daily. 30 each 0 Taking  . traMADol (ULTRAM) 50 MG tablet Take 1-2 tablets (50-100 mg total) by mouth 4 (four) times daily. 150 tablet 0 Taking  . traZODone (DESYREL) 50 MG tablet Take 1 tablet (50 mg total) by mouth at bedtime as needed for sleep. 30 tablet 1     Review of Systems  Constitutional: Negative.   HENT: Negative.   Respiratory: Negative.   Cardiovascular: Negative.   Gastrointestinal: Positive for abdominal pain, constipation and nausea. Negative for  vomiting.  Genitourinary: Negative for dysuria.  Musculoskeletal: Positive for back pain.  Neurological: Negative.    Physical Exam   Blood pressure 117/76, pulse 94, temperature 98 F (36.7 C), temperature source Oral, resp. rate 17, weight 207 lb 12 oz (94.2 kg), last menstrual period 02/02/2017, SpO2 98 %, unknown if currently breastfeeding.  Physical Exam  Constitutional: She is oriented to person, place, and time. She appears well-developed and well-nourished.  HENT:  Head: Normocephalic.  Neck: Normal range of motion.  Respiratory: Effort normal.  GI: Soft.  Genitourinary:  Genitourinary Comments: Normal external genitalia; no discharge or bleeding in the vagina. Vaginal walls pink.  No CMT, suprapubic or adnexal tenderness.   Neurological: She is alert and oriented to person, place, and time.  Skin: Skin is warm and dry.  Psychiatric: She has a normal mood and affect.  No CVAT.   MAU Course  Procedures  MDM -CBC, CMP, ABO -US shows IUP with FHR of 175 -Beta pending.  -Urine culture pending  Wet prep positive for trich;  Treated in MAU with 2 grams of Flagyl.  -She declined partner therapy; will ask FOB to go to health department.  -[place] results independently reviewed by me.   Assessment and Plan   1. Back pain affecting pregnancy in first trimester   2. Abdominal pain affecting pregnancy   3. Intrauterine pregnancy    2. Patient stable for discharge with recommendations to start prenatal care; patient desires to be seen at [facility].  3. Suggested heat, massage, positioning for low back pain. Due to patient's history of trauma, back pain may be exacerbated by pregnancy.  4. Warning signs reviewed; all questions answered.  Charlesetta Garibaldi Sahara Fujimoto 03/21/2017, 3:27 PM

## 2017-03-22 LAB — GC/CHLAMYDIA PROBE AMP (~~LOC~~) NOT AT ARMC
Chlamydia: NEGATIVE
NEISSERIA GONORRHEA: NEGATIVE

## 2017-03-23 LAB — CULTURE, OB URINE: Culture: >=100000 — AB

## 2017-04-18 ENCOUNTER — Encounter: Payer: Self-pay | Admitting: Certified Nurse Midwife

## 2017-04-26 ENCOUNTER — Ambulatory Visit (INDEPENDENT_AMBULATORY_CARE_PROVIDER_SITE_OTHER): Payer: BLUE CROSS/BLUE SHIELD | Admitting: Certified Nurse Midwife

## 2017-04-26 ENCOUNTER — Encounter: Payer: Self-pay | Admitting: Certified Nurse Midwife

## 2017-04-26 ENCOUNTER — Other Ambulatory Visit (HOSPITAL_COMMUNITY)
Admission: RE | Admit: 2017-04-26 | Discharge: 2017-04-26 | Disposition: A | Discharge status: Home / Self Care | Payer: BLUE CROSS/BLUE SHIELD | Source: Ambulatory Visit | Attending: Certified Nurse Midwife | Admitting: Certified Nurse Midwife

## 2017-04-26 VITALS — BP 125/76 | HR 111 | Wt 211.4 lb

## 2017-04-26 DIAGNOSIS — Z3400 Encounter for supervision of normal first pregnancy, unspecified trimester: Secondary | ICD-10-CM | POA: Insufficient documentation

## 2017-04-26 DIAGNOSIS — N870 Mild cervical dysplasia: Secondary | ICD-10-CM | POA: Insufficient documentation

## 2017-04-26 DIAGNOSIS — Z3401 Encounter for supervision of normal first pregnancy, first trimester: Secondary | ICD-10-CM | POA: Diagnosis not present

## 2017-04-26 DIAGNOSIS — B373 Candidiasis of vulva and vagina: Secondary | ICD-10-CM

## 2017-04-26 DIAGNOSIS — R87810 Cervical high risk human papillomavirus (HPV) DNA test positive: Secondary | ICD-10-CM | POA: Insufficient documentation

## 2017-04-26 DIAGNOSIS — B3731 Acute candidiasis of vulva and vagina: Secondary | ICD-10-CM

## 2017-04-26 DIAGNOSIS — D573 Sickle-cell trait: Secondary | ICD-10-CM

## 2017-04-26 DIAGNOSIS — O219 Vomiting of pregnancy, unspecified: Secondary | ICD-10-CM

## 2017-04-26 DIAGNOSIS — A5901 Trichomonal vulvovaginitis: Secondary | ICD-10-CM

## 2017-04-26 HISTORY — DX: Encounter for supervision of normal first pregnancy, unspecified trimester: Z34.00

## 2017-04-26 MED ORDER — TERCONAZOLE 0.8 % VA CREA: 1.0000 | TOPICAL_CREAM | Freq: Every day | VAGINAL | 0 refills | Status: DC

## 2017-04-26 MED ORDER — DOXYLAMINE-PYRIDOXINE 10-10 MG PO TBEC: DELAYED_RELEASE_TABLET | ORAL | 4 refills | Status: DC

## 2017-04-26 MED ORDER — VITAFOL FE+ 90-1-200 & 50 MG PO CPPK: 2.0000 | ORAL_CAPSULE | Freq: Every day | ORAL | 12 refills | Status: DC
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
## 2017-04-26 NOTE — Progress Notes (Signed)
Patient is in the office for initial ob visit, fob is not involved at the moment, pt's mother is with her today. Pt denies in any pain.

## 2017-04-27 ENCOUNTER — Encounter: Payer: Self-pay | Admitting: Certified Nurse Midwife

## 2017-04-27 ENCOUNTER — Other Ambulatory Visit: Payer: BLUE CROSS/BLUE SHIELD

## 2017-04-27 LAB — CERVICOVAGINAL ANCILLARY ONLY
Bacterial vaginitis: POSITIVE — AB
CANDIDA VAGINITIS: NEGATIVE
CHLAMYDIA, DNA PROBE: NEGATIVE
NEISSERIA GONORRHEA: NEGATIVE
Trichomonas: NEGATIVE

## 2017-04-29 LAB — OBSTETRIC PANEL, INCLUDING HIV
Antibody Screen: NEGATIVE
BASOS ABS: 0 10*3/uL (ref 0.0–0.2)
Basos: 0 %
EOS (ABSOLUTE): 0.1 10*3/uL (ref 0.0–0.4)
Eos: 1 %
HEP B S AG: NEGATIVE
HIV SCREEN 4TH GENERATION: NONREACTIVE
Hematocrit: 30.8 % — ABNORMAL LOW (ref 34.0–46.6)
Hemoglobin: 10.3 g/dL — ABNORMAL LOW (ref 11.1–15.9)
IMMATURE GRANULOCYTES: 0 %
Immature Grans (Abs): 0 10*3/uL (ref 0.0–0.1)
LYMPHS ABS: 2.8 10*3/uL (ref 0.7–3.1)
Lymphs: 28 %
MCH: 30.2 pg (ref 26.6–33.0)
MCHC: 33.4 g/dL (ref 31.5–35.7)
MCV: 90 fL (ref 79–97)
Monocytes Absolute: 0.9 10*3/uL (ref 0.1–0.9)
Monocytes: 9 %
NEUTROS ABS: 6.3 10*3/uL (ref 1.4–7.0)
NEUTROS PCT: 62 %
PLATELETS: 340 10*3/uL (ref 150–379)
RBC: 3.41 x10E6/uL — ABNORMAL LOW (ref 3.77–5.28)
RDW: 17.9 % — AB (ref 12.3–15.4)
RPR Ser Ql: NONREACTIVE
Rh Factor: POSITIVE
Rubella Antibodies, IGG: 1.55 index (ref >0.99)
WBC: 10.1 10*3/uL (ref 3.4–10.8)

## 2017-04-29 LAB — VITAMIN D 25 HYDROXY (VIT D DEFICIENCY, FRACTURES): Vit D, 25-Hydroxy: 20.3 ng/mL — ABNORMAL LOW (ref 30.0–100.0)

## 2017-04-29 LAB — HEMOGLOBINOPATHY EVALUATION
HEMOGLOBIN A2 QUANTITATION: 3.4 % — AB (ref 1.8–3.2)
HEMOGLOBIN F QUANTITATION: 2.6 % — AB (ref 0.0–2.0)
HGB A: 0 % — AB (ref 96.4–98.8)
HGB C: 43.7 % — AB
HGB S: 50.3 % — ABNORMAL HIGH
HGB VARIANT: 0 %

## 2017-04-29 LAB — HEMOGLOBIN A1C: Hgb A1c MFr Bld: <4.2 % — ABNORMAL LOW (ref 4.8–5.6)

## 2017-05-01 ENCOUNTER — Other Ambulatory Visit: Payer: Self-pay | Admitting: Certified Nurse Midwife

## 2017-05-02 ENCOUNTER — Other Ambulatory Visit: Payer: Self-pay | Admitting: Certified Nurse Midwife

## 2017-05-02 DIAGNOSIS — Z3400 Encounter for supervision of normal first pregnancy, unspecified trimester: Secondary | ICD-10-CM

## 2017-05-02 DIAGNOSIS — E559 Vitamin D deficiency, unspecified: Secondary | ICD-10-CM

## 2017-05-02 DIAGNOSIS — B9689 Other specified bacterial agents as the cause of diseases classified elsewhere: Secondary | ICD-10-CM

## 2017-05-02 DIAGNOSIS — N760 Acute vaginitis: Secondary | ICD-10-CM

## 2017-05-02 DIAGNOSIS — D582 Other hemoglobinopathies: Secondary | ICD-10-CM

## 2017-05-02 DIAGNOSIS — R87612 Low grade squamous intraepithelial lesion on cytologic smear of cervix (LGSIL): Secondary | ICD-10-CM | POA: Insufficient documentation

## 2017-05-02 HISTORY — DX: Other hemoglobinopathies: D58.2

## 2017-05-02 LAB — URINE CULTURE, OB REFLEX

## 2017-05-02 LAB — CYTOLOGY - PAP
HPV 16/18/45 genotyping: NEGATIVE
HPV: DETECTED — AB

## 2017-05-02 LAB — CULTURE, OB URINE

## 2017-05-02 MED ORDER — METRONIDAZOLE 500 MG PO TABS: 500.0000 mg | ORAL_TABLET | Freq: Two times a day (BID) | ORAL | 0 refills | Status: DC

## 2017-05-02 MED ORDER — VITAMIN D (ERGOCALCIFEROL) 1.25 MG (50000 UNIT) PO CAPS: 50000.0000 [IU] | ORAL_CAPSULE | ORAL | 2 refills | Status: DC

## 2017-05-03 ENCOUNTER — Other Ambulatory Visit: Payer: Self-pay | Admitting: Certified Nurse Midwife

## 2017-05-03 ENCOUNTER — Telehealth: Payer: Self-pay

## 2017-05-03 DIAGNOSIS — O2342 Unspecified infection of urinary tract in pregnancy, second trimester: Secondary | ICD-10-CM

## 2017-05-03 MED ORDER — CEFIXIME 400 MG PO CAPS: 400.0000 mg | ORAL_CAPSULE | Freq: Every day | ORAL | 0 refills | Status: DC

## 2017-05-03 NOTE — Telephone Encounter (Signed)
Patient notified of results and RX 

## 2017-05-03 NOTE — Telephone Encounter (Signed)
Patient notified of results and Rx and Hematology Consult.

## 2017-05-03 NOTE — Telephone Encounter (Signed)
-----   Message from Roe Coombsachelle A Denney, CNM sent at 05/03/2017  2:38 PM EDT ----- Please let her know that she has a UTI and Suprax 1X daily for 10 days has been sent in for her to take.  Please also tell her that if she has any fever, increased back pain with N&V or decreased urine output to let us know. Thank you.

## 2017-05-03 NOTE — Telephone Encounter (Signed)
-----   Message from Roe Coombsachelle A Denney, CNM sent at 05/02/2017 10:49 AM EDT ----- Please let her know that she has BV.  An Rx for Flagyl has been sent to the pharmacy for the BV. I have also sent  Vitamin D weekly tablet has been sent to her pharmacy for her to take for her low vitamin D level.  Hematology consult was placed for abnormal hemoglobinopathy evaluation.    Thank you.  R.Denney CNM

## 2017-05-04 ENCOUNTER — Telehealth: Payer: Self-pay | Admitting: Hematology

## 2017-05-04 NOTE — Telephone Encounter (Signed)
Left message for patient regarding appt date/time/location/phone number

## 2017-05-05 LAB — INHERITEST CORE(CF97,SMA,FRAX)

## 2017-05-08 ENCOUNTER — Encounter: Payer: Self-pay | Admitting: Hematology

## 2017-05-08 ENCOUNTER — Other Ambulatory Visit: Payer: Self-pay | Admitting: Certified Nurse Midwife

## 2017-05-08 DIAGNOSIS — Z3400 Encounter for supervision of normal first pregnancy, unspecified trimester: Secondary | ICD-10-CM

## 2017-05-08 DIAGNOSIS — D571 Sickle-cell disease without crisis: Secondary | ICD-10-CM | POA: Insufficient documentation

## 2017-05-10 ENCOUNTER — Encounter: Payer: Self-pay | Admitting: Obstetrics and Gynecology

## 2017-05-10 DIAGNOSIS — O9989 Other specified diseases and conditions complicating pregnancy, childbirth and the puerperium: Secondary | ICD-10-CM

## 2017-05-10 DIAGNOSIS — R8271 Bacteriuria: Secondary | ICD-10-CM | POA: Insufficient documentation

## 2017-05-24 ENCOUNTER — Encounter: Payer: Self-pay | Admitting: Certified Nurse Midwife

## 2017-05-24 ENCOUNTER — Encounter: Payer: Self-pay | Admitting: Obstetrics and Gynecology

## 2017-05-24 ENCOUNTER — Ambulatory Visit (INDEPENDENT_AMBULATORY_CARE_PROVIDER_SITE_OTHER): Payer: BLUE CROSS/BLUE SHIELD | Admitting: Certified Nurse Midwife

## 2017-05-24 VITALS — BP 110/72 | HR 82 | Wt 209.0 lb

## 2017-05-24 DIAGNOSIS — Z3400 Encounter for supervision of normal first pregnancy, unspecified trimester: Secondary | ICD-10-CM

## 2017-05-24 DIAGNOSIS — D571 Sickle-cell disease without crisis: Secondary | ICD-10-CM | POA: Insufficient documentation

## 2017-05-24 DIAGNOSIS — E559 Vitamin D deficiency, unspecified: Secondary | ICD-10-CM

## 2017-05-24 DIAGNOSIS — O219 Vomiting of pregnancy, unspecified: Secondary | ICD-10-CM

## 2017-05-24 DIAGNOSIS — D582 Other hemoglobinopathies: Secondary | ICD-10-CM

## 2017-05-24 MED ORDER — ONDANSETRON 8 MG PO TBDP: 8.0000 mg | ORAL_TABLET | Freq: Three times a day (TID) | ORAL | 0 refills | Status: DC | PRN

## 2017-05-24 MED ORDER — FOLIC ACID 1 MG PO TABS: 5.0000 mg | ORAL_TABLET | Freq: Every day | ORAL | 2 refills | Status: DC
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
## 2017-05-25 LAB — CBC
Hematocrit: 30.9 % — ABNORMAL LOW (ref 34.0–46.6)
Hemoglobin: 10.1 g/dL — ABNORMAL LOW (ref 11.1–15.9)
MCH: 30.1 pg (ref 26.6–33.0)
MCHC: 32.7 g/dL (ref 31.5–35.7)
MCV: 92 fL (ref 79–97)
Platelets: 292 10*3/uL (ref 150–379)
RBC: 3.36 x10E6/uL — ABNORMAL LOW (ref 3.77–5.28)
RDW: 18.7 % — ABNORMAL HIGH (ref 12.3–15.4)
WBC: 9.4 10*3/uL (ref 3.4–10.8)

## 2017-05-25 LAB — COMPREHENSIVE METABOLIC PANEL
ALK PHOS: 51 IU/L (ref 39–117)
ALT: 29 IU/L (ref 0–32)
AST: 46 IU/L — AB (ref 0–40)
Albumin/Globulin Ratio: 1.4 (ref 1.2–2.2)
Albumin: 4.4 g/dL (ref 3.5–5.5)
BUN / CREAT RATIO: 9 (ref 9–23)
BUN: 7 mg/dL (ref 6–20)
Bilirubin Total: 0.8 mg/dL (ref 0.0–1.2)
CHLORIDE: 105 mmol/L (ref 96–106)
CO2: 14 mmol/L — AB (ref 20–29)
Calcium: 9.5 mg/dL (ref 8.7–10.2)
Creatinine, Ser: 0.81 mg/dL (ref 0.57–1.00)
GFR calc Af Amer: 117 mL/min/{1.73_m2} (ref >59)
GFR calc non Af Amer: 101 mL/min/{1.73_m2} (ref >59)
GLUCOSE: 91 mg/dL (ref 65–99)
Globulin, Total: 3.2 g/dL (ref 1.5–4.5)
Potassium: 4.4 mmol/L (ref 3.5–5.2)
Sodium: 140 mmol/L (ref 134–144)
Total Protein: 7.6 g/dL (ref 6.0–8.5)

## 2017-05-25 LAB — PROTEIN / CREATININE RATIO, URINE
CREATININE, UR: 216.8 mg/dL
PROTEIN UR: 34.4 mg/dL
Protein/Creat Ratio: 159 mg/g creat (ref 0–200)

## 2017-05-26 ENCOUNTER — Other Ambulatory Visit: Payer: Self-pay | Admitting: Certified Nurse Midwife

## 2017-05-26 LAB — AFP, SERUM, OPEN SPINA BIFIDA
AFP MoM: 0.82
AFP VALUE AFPOSL: 24.9 ng/mL
GEST. AGE ON COLLECTION DATE: 16.4 wk
MATERNAL AGE AT EDD: 26.5 a
OSBR Risk 1 IN: 10000
Test Results:: NEGATIVE
WEIGHT: 209 [lb_av]

## 2017-05-29 ENCOUNTER — Telehealth: Payer: Self-pay

## 2017-05-29 ENCOUNTER — Other Ambulatory Visit: Payer: Self-pay | Admitting: Certified Nurse Midwife

## 2017-05-29 DIAGNOSIS — O2342 Unspecified infection of urinary tract in pregnancy, second trimester: Secondary | ICD-10-CM

## 2017-05-29 LAB — URINE CULTURE, OB REFLEX

## 2017-05-29 LAB — CULTURE, OB URINE

## 2017-05-29 MED ORDER — CEFIXIME 400 MG PO CAPS: 400.0000 mg | ORAL_CAPSULE | Freq: Every day | ORAL | 0 refills | Status: DC

## 2017-05-29 MED ORDER — AMOXICILLIN-POT CLAVULANATE 875-125 MG PO TABS: 1.0000 | ORAL_TABLET | Freq: Every day | ORAL | 6 refills | Status: DC

## 2017-05-29 NOTE — Telephone Encounter (Signed)
Attempted to contact about results and rx sent...Marland KitchenMarland KitchenMarland Kitchen no answer, unable to leave vm.

## 2017-06-08 ENCOUNTER — Other Ambulatory Visit: Payer: Self-pay | Admitting: Certified Nurse Midwife

## 2017-06-08 ENCOUNTER — Encounter (HOSPITAL_COMMUNITY): Payer: Self-pay

## 2017-06-08 ENCOUNTER — Ambulatory Visit (HOSPITAL_COMMUNITY)
Admission: RE | Admit: 2017-06-08 | Discharge: 2017-06-08 | Disposition: A | Discharge status: Home / Self Care | Payer: BLUE CROSS/BLUE SHIELD | Source: Ambulatory Visit | Attending: Certified Nurse Midwife | Admitting: Certified Nurse Midwife

## 2017-06-08 DIAGNOSIS — Z3A18 18 weeks gestation of pregnancy: Secondary | ICD-10-CM | POA: Insufficient documentation

## 2017-06-08 DIAGNOSIS — Z3400 Encounter for supervision of normal first pregnancy, unspecified trimester: Secondary | ICD-10-CM

## 2017-06-08 DIAGNOSIS — Z363 Encounter for antenatal screening for malformations: Secondary | ICD-10-CM

## 2017-06-08 DIAGNOSIS — D571 Sickle-cell disease without crisis: Secondary | ICD-10-CM

## 2017-06-08 DIAGNOSIS — O99012 Anemia complicating pregnancy, second trimester: Secondary | ICD-10-CM

## 2017-06-08 DIAGNOSIS — Z862 Personal history of diseases of the blood and blood-forming organs and certain disorders involving the immune mechanism: Secondary | ICD-10-CM

## 2017-06-08 DIAGNOSIS — D57219 Sickle-cell/Hb-C disease with crisis, unspecified: Secondary | ICD-10-CM

## 2017-06-08 DIAGNOSIS — D582 Other hemoglobinopathies: Secondary | ICD-10-CM

## 2017-06-08 NOTE — Progress Notes (Signed)
Genetic Counseling  High-Risk Gestation Note  Appointment Date:  06/08/2017 Referred By: Morene Crocker, CNM Date of Birth:  1990/11/02   Pregnancy History: G1P0000 Estimated Date of Delivery: 11/05/17 Estimated Gestational Age: 89w4dAttending: MRenella Cunas MD   I met with 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 The patient's mother accompanied her to today's visit.   In summary:  Discussed personal history of Sickle-hemoglobin C (Hb [place]) disease  Patient has not been evaluated by hematology and does not report history of significant symptoms  OB provider has referred patient to hematology  Reviewed autosomal recessive inheritance of beta chain hemoglobinopathies  Discussed that all of patient's offspring will inherit either sickle cell trait (Hb S trait) or Hb C trait  Risk for hemoglobinopathy depends upon carrier status of father of the pregnancy  Prior to screening for father of pregnancy, current pregnancy has approximately up to 1 in 24 risk for sickle cell disease (Hb SS) using general population carrier frequency  We reviewed that different beta globin variants can lead to various forms of hemoglobinopathy and symptoms depend upon variants present  Discussed options of screening / testing  Carrier screening for father of the pregnancy- patient [place] for [place]  Amniocentesis when pregnancy identified at risk- patient declined amniocentesis, even in event that father of pregnancy is identified to be a carrier  Newborn screening- hemoglobinopathies assessed on newborn screening  We began by reviewing family history in detail. Ms. CGhristhad hemoglobin electrophoresis performed through her OB provider on 04/26/2017, which resulted as consistent for hemoglobin [place] disease for the patient.  Specifically, the pattern detected included 0% Hb A, 50.3% Hb S, 43.7% Hb C, 3.4% Hb A2, and 2.6% Hb F. We discussed that this pattern is not consistent with sickle cell trait of hemoglobin C trait status but rather with hemoglobin [place] disease. The patient reported that she has not previously been diagnosed with Hb [place] disease and has not required evaluation or been followed by hematology in the past. She did not report significant symptoms. She reported two paternal great aunts with sickle cell disease but no additional relatives with known sickle cell disease or hemoglobinopathy.   We spent time reviewing genes, chromosomes, and the autosomal recessive inheritance of hemoglobinopathies. We discussed that sickle cell disease affects the shape and function of the red blood cell by producing abnormal hemoglobin. They were counseled that hemoglobin is a protein in the RBCs that carries oxygen to the body's organs. Hemoglobin is comprised of alpha subunits and beta subunits. We discussed that sickle cell disease and variants, such as hemoglobin S/C disease are caused by changes in the beta globin subunits. Individuals who have beta chain hemoglobinopathies have a changes within the HBB gene that codes for the beta subunit of hemoglobin. Ms. CRekowskiwas counseled that Hb S/C disease is inherited in an autosomal recessive manner, and occurs when both copies of the hemoglobin gene are changed and produce an abnormal hemoglobin. Thus, Ms. CGiustowould be assumed to have inherited one copy of hemoglobin C from one parents and one copy of hemoglobin S from the other parent. A carrier of sickle cell trait or hemoglobin C trait has one altered copy of the gene for hemoglobin and one typical working copy. Carriers of recessive conditions typically do not have symptoms related to the condition because they still have one  functioning copy of the gene, and thus some production of the typical protein coded for by that gene. We provided  Ms. Welliver with written resources regarding Hb [place] disease and hemoglobinopathies.   We spent time reviewing typical and variable features of sickle cell disease. In general, individuals with Hb S/C disease or Hb S in combination with a different HBB pathogenic variant from Hb S typically are observed to have a milder disease course than individuals with Hb SS. Individuals with Hb S/C disease have longer red cell life span with higher hemoglobin concentration, which is associated with fewer vaso-occlusive pain episodes. There is a risk for splenomegaly and proliferative retinopathy and avascular necrosis. Given Ms. Stabenow's hemoglobin electrophoresis results indicating that she has hemoglobin [place] disease (sickle-hemoglobin C disease), we discussed the importance of evaluation and management with hematology. The patient's OB notes indicate that a referral was recently placed for the patient to hematology.   Given the autosomal recessive inheritance, we discussed that all of Ms. Hayward's offspring are expected to be obligate carriers for either Hb S (sickle cell) trait or Hb C trait, and there is a 1 in 2 (50%) chance each time which trait her offspring would inherit. The risk for a hemoglobinopathy in offspring depends upon the hemoglobin carrier status of the father of the pregnancy. The patient understands that if the father of the pregnancy is not a carrier of a beta globin chain pathogenic variant, then the pregnancy would not be at risk to have sickle cell disease but be expected to have either Hb S trait of Hb C trait. If he were to be a carrier, then the risk would be 1 in 2 for offspring to be affected with a hemoglobinopathy and the exact phenotype would depend upon the beta globin pathogenic variants present. The patient reported that the father of the pregnancy is African American, has no known family history of sickle cell disease or sickle cell trait, and consanguinity for the couple was denied. We discussed  that he would have the general population risk of approximately 1 in 12 to carry Hb S trait. The carrier frequency for other hemoglobin variants including Hb C and beta thalassemia is lower. Thus, prior to screening for the father of the pregnancy, the risk for sickle cell disease in offspring is approximately up to 1 in 24. Carrier screening for the father of the pregnancy would further refine this risk assessment.   We discussed that the father of the pregnancy has the option of carrier testing by hemoglobin electrophoresis of molecular HBB analysis to determine whether he has any hemoglobin variant, including sickle cell trait. He was not present at today's appointment. Ms. Kachmar may contact us should they desire [place] to facilitate screening for him. If both parents are identified to have pathogenic HBB variants and a pregnancy is at risk for a hemoglobinopathy, prenatal molecular diagnosis would be available by amniocentesis. We reviewed risks, benefits, and limitations of amniocentesis, including the associated 1 in 161-096 risk for complications. Ms. Segovia stated she would not be interested in amniocentesis in pregnancy, even if the father of the pregnancy were identified to carry a beta chain hemoglobin variant. We also reviewed the availability of newborn screening in [place] for hemoglobinopathies. Ms. Bodie is aware that prenatal ultrasound is not able to assess for features of sickle cell disease or other beta chain hemoglobinopathies.   The family histories were otherwise found to be noncontributory for birth defects, intellectual disability, and known genetic conditions.  Without further information regarding the provided family history, an accurate genetic risk cannot be calculated. Further genetic counseling is warranted if more information is obtained.  The patient additionally had carrier screening for cystic fibrosis, spinal muscular atrophy, and Fragile X syndrome through her OB provider.  We discussed that these screens ([facility] through [facility]) were within normal limits. Ms. Wyse also previously had noninvasive prenatal screening (NIPS)/prenatal cell free DNA testing for fetal aneuploidy, which was within normal limits.   Detailed ultrasound was performed today. Visualized fetal anatomy was within normal limits. She understands that ultrasound would not be able to assist in risk assessment for beta chain hemoglobinopathies. She understands that ultrasound cannot rule out all birth defects or genetic syndromes.   Ms. Zoei Amison denied exposure to environmental toxins or chemical agents. She denied the use of alcohol, tobacco or street drugs.  I counseled Ms. Renee Erb regarding the above risks and available options.  The approximate face-to-face time with the genetic counselor was 40 minutes.  Chipper Oman, MS Certified Genetic Counselor 06/08/2017

## 2017-06-09 ENCOUNTER — Other Ambulatory Visit: Payer: Self-pay | Admitting: Certified Nurse Midwife

## 2017-06-09 DIAGNOSIS — D5720 Sickle-cell/Hb-C disease without crisis: Secondary | ICD-10-CM

## 2017-06-09 DIAGNOSIS — Z3400 Encounter for supervision of normal first pregnancy, unspecified trimester: Secondary | ICD-10-CM

## 2017-06-09 DIAGNOSIS — Z3A18 18 weeks gestation of pregnancy: Secondary | ICD-10-CM | POA: Insufficient documentation

## 2017-06-21 ENCOUNTER — Encounter: Payer: Self-pay | Admitting: Obstetrics and Gynecology

## 2017-06-21 ENCOUNTER — Encounter: Payer: Self-pay | Admitting: Certified Nurse Midwife

## 2017-06-21 ENCOUNTER — Other Ambulatory Visit: Payer: Self-pay

## 2017-06-21 ENCOUNTER — Ambulatory Visit (INDEPENDENT_AMBULATORY_CARE_PROVIDER_SITE_OTHER): Payer: BLUE CROSS/BLUE SHIELD | Admitting: Obstetrics and Gynecology

## 2017-06-21 VITALS — BP 112/72 | HR 103 | Wt 219.0 lb

## 2017-06-21 DIAGNOSIS — R87612 Low grade squamous intraepithelial lesion on cytologic smear of cervix (LGSIL): Secondary | ICD-10-CM

## 2017-06-21 DIAGNOSIS — D5720 Sickle-cell/Hb-C disease without crisis: Secondary | ICD-10-CM

## 2017-06-21 DIAGNOSIS — Z3400 Encounter for supervision of normal first pregnancy, unspecified trimester: Secondary | ICD-10-CM

## 2017-06-21 DIAGNOSIS — D582 Other hemoglobinopathies: Secondary | ICD-10-CM

## 2017-06-21 NOTE — Patient Instructions (Signed)
Contraception Choices Contraception, also called birth control, refers to methods or devices that prevent pregnancy. Hormonal methods Contraceptive implant A contraceptive implant is a thin, plastic tube that contains a hormone. It is inserted into the upper part of the arm. It can remain in place for up to 3 years. Progestin-only injections Progestin-only injections are injections of progestin, a synthetic form of the hormone progesterone. They are given every 3 months by a health care provider. Birth control pills Birth control pills are pills that contain hormones that prevent pregnancy. They must be taken once a day, preferably at the same time each day. Birth control patch The birth control patch contains hormones that prevent pregnancy. It is placed on the skin and must be changed once a week for three weeks and removed on the fourth week. A prescription is needed to use this method of contraception. Vaginal ring A vaginal ring contains hormones that prevent pregnancy. It is placed in the vagina for three weeks and removed on the fourth week. After that, the process is repeated with a new ring. A prescription is needed to use this method of contraception. Emergency contraceptive Emergency contraceptives prevent pregnancy after unprotected sex. They come in pill form and can be taken up to 5 days after sex. They work best the sooner they are taken after having sex. Most emergency contraceptives are available without a prescription. This method should not be used as your only form of birth control. Barrier methods Female condom A female condom is a thin sheath that is worn over the penis during sex. Condoms keep sperm from going inside a woman's body. They can be used with a spermicide to increase their effectiveness. They should be disposed after a single use. Female condom A female condom is a soft, loose-fitting sheath that is put into the vagina before sex. The condom keeps sperm from going  inside a woman's body. They should be disposed after a single use.  Intrauterine contraception Intrauterine device (IUD) An IUD is a T-shaped device that is put in a woman's uterus. There are two types:  Hormone IUD.This type contains progestin, a synthetic form of the hormone progesterone. This type can stay in place for 3-5 years.  Copper IUD.This type is wrapped in copper wire. It can stay in place for 10 years.  Permanent methods of contraception Female tubal ligation In this method, a woman's fallopian tubes are sealed, tied, or blocked during surgery to prevent eggs from traveling to the uterus. Hysteroscopic sterilization In this method, a small, flexible insert is placed into each fallopian tube. The inserts cause scar tissue to form in the fallopian tubes and block them, so sperm cannot reach an egg. The procedure takes about 3 months to be effective. Another form of birth control must be used during those 3 months. Female sterilization This is a procedure to tie off the tubes that carry sperm (vasectomy). After the procedure, the man can still ejaculate fluid (semen). Natural planning methods Natural family planning In this method, a couple does not have sex on days when the woman could become pregnant. Calendar method This means keeping track of the length of each menstrual cycle, identifying the days when pregnancy can happen, and not having sex on those days. Ovulation method In this method, a couple avoids sex during ovulation. Symptothermal method This method involves not having sex during ovulation. The woman typically checks for ovulation by watching changes in her temperature and in the consistency of cervical mucus. Post-ovulation method In  this method, a couple waits to have sex until after ovulation. Summary  Contraception, also called birth control, means methods or devices that prevent pregnancy.  Hormonal methods of contraception include implants, injections,  pills, patches, vaginal rings, and emergency contraceptives.  Barrier methods of contraception can include female condoms, female condoms, diaphragms, cervical caps, sponges, and spermicides.  There are two types of IUDs (intrauterine devices). An IUD can be put in a woman's uterus to prevent pregnancy for 3-5 years.  Permanent sterilization can be done through a procedure for males, females, or both.  Natural family planning methods involve not having sex on days when the woman could become pregnant. This information is not intended to replace advice given to you by your health care provider. Make sure you discuss any questions you have with your health care provider. Document Released: 01/10/2005 Document Revised: 02/13/2016 Document Reviewed: 02/13/2016 Elsevier Interactive Patient Education  2018 [employer].
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
## 2017-06-21 NOTE — Progress Notes (Signed)
ROB C/o nausea  

## 2017-07-19 ENCOUNTER — Encounter: Payer: Self-pay | Admitting: Obstetrics and Gynecology

## 2017-07-19 ENCOUNTER — Ambulatory Visit (INDEPENDENT_AMBULATORY_CARE_PROVIDER_SITE_OTHER): Payer: BLUE CROSS/BLUE SHIELD | Admitting: Obstetrics and Gynecology

## 2017-07-19 VITALS — BP 118/76 | HR 118 | Wt 216.0 lb

## 2017-07-19 DIAGNOSIS — R8271 Bacteriuria: Secondary | ICD-10-CM

## 2017-07-19 DIAGNOSIS — O99891 Other specified diseases and conditions complicating pregnancy: Secondary | ICD-10-CM

## 2017-07-19 DIAGNOSIS — Z3400 Encounter for supervision of normal first pregnancy, unspecified trimester: Secondary | ICD-10-CM

## 2017-07-19 DIAGNOSIS — O2342 Unspecified infection of urinary tract in pregnancy, second trimester: Secondary | ICD-10-CM

## 2017-07-19 DIAGNOSIS — D5720 Sickle-cell/Hb-C disease without crisis: Secondary | ICD-10-CM

## 2017-07-19 DIAGNOSIS — O9989 Other specified diseases and conditions complicating pregnancy, childbirth and the puerperium: Secondary | ICD-10-CM

## 2017-07-19 MED ORDER — AMOXICILLIN-POT CLAVULANATE 875-125 MG PO TABS: 1.0000 | ORAL_TABLET | Freq: Every day | ORAL | 6 refills | Status: DC
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
## 2017-07-19 NOTE — Addendum Note (Signed)
Addended by: Tim LairLARK, Jerzie Bieri on: 07/19/2017 03:43 PM   Modules accepted: Orders

## 2017-07-24 ENCOUNTER — Ambulatory Visit (HOSPITAL_COMMUNITY)
Admission: RE | Admit: 2017-07-24 | Discharge: 2017-07-24 | Disposition: A | Discharge status: Home / Self Care | Payer: BLUE CROSS/BLUE SHIELD | Source: Ambulatory Visit | Attending: Certified Nurse Midwife | Admitting: Certified Nurse Midwife

## 2017-07-24 DIAGNOSIS — D5720 Sickle-cell/Hb-C disease without crisis: Secondary | ICD-10-CM

## 2017-07-24 DIAGNOSIS — O99012 Anemia complicating pregnancy, second trimester: Secondary | ICD-10-CM | POA: Diagnosis not present

## 2017-07-24 DIAGNOSIS — Z362 Encounter for other antenatal screening follow-up: Secondary | ICD-10-CM | POA: Diagnosis not present

## 2017-07-24 DIAGNOSIS — D571 Sickle-cell disease without crisis: Secondary | ICD-10-CM

## 2017-07-24 DIAGNOSIS — Z3A25 25 weeks gestation of pregnancy: Secondary | ICD-10-CM

## 2017-07-24 DIAGNOSIS — Z3400 Encounter for supervision of normal first pregnancy, unspecified trimester: Secondary | ICD-10-CM

## 2017-08-02 ENCOUNTER — Other Ambulatory Visit: Payer: Self-pay | Admitting: Certified Nurse Midwife

## 2017-08-02 DIAGNOSIS — D5720 Sickle-cell/Hb-C disease without crisis: Secondary | ICD-10-CM

## 2017-08-02 DIAGNOSIS — Z3400 Encounter for supervision of normal first pregnancy, unspecified trimester: Secondary | ICD-10-CM

## 2017-08-16 ENCOUNTER — Ambulatory Visit (INDEPENDENT_AMBULATORY_CARE_PROVIDER_SITE_OTHER): Payer: BLUE CROSS/BLUE SHIELD | Admitting: Obstetrics & Gynecology

## 2017-08-16 ENCOUNTER — Other Ambulatory Visit: Payer: BLUE CROSS/BLUE SHIELD

## 2017-08-16 VITALS — BP 115/76 | HR 99 | Wt 219.7 lb

## 2017-08-16 DIAGNOSIS — D5720 Sickle-cell/Hb-C disease without crisis: Secondary | ICD-10-CM

## 2017-08-16 DIAGNOSIS — Z3400 Encounter for supervision of normal first pregnancy, unspecified trimester: Secondary | ICD-10-CM
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
## 2017-08-16 NOTE — Patient Instructions (Addendum)
Third Trimester of Pregnancy The third trimester is from week 29 through week 42, months 7 through 9. This trimester is when your unborn baby (fetus) is growing very fast. At the end of the ninth month, the unborn baby is about 20 inches in length. It weighs about 6-10 pounds. Follow these instructions at home:  Avoid all smoking, herbs, and alcohol. Avoid drugs not approved by your doctor.  Do not use any tobacco products, including cigarettes, chewing tobacco, and electronic cigarettes. If you need help quitting, ask your doctor. You may get counseling or other support to help you quit.  Only take medicine as told by your doctor. Some medicines are safe and some are not during pregnancy.  Exercise only as told by your doctor. Stop exercising if you start having cramps.  Eat regular, healthy meals.  Wear a good support bra if your breasts are tender.  Do not use hot tubs, steam rooms, or saunas.  Wear your seat belt when driving.  Avoid raw meat, uncooked cheese, and liter boxes and soil used by cats.  Take your prenatal vitamins.  Take 1500-2000 milligrams of calcium daily starting at the 20th week of pregnancy until you deliver your baby.  Try taking medicine that helps you poop (stool softener) as needed, and if your doctor approves. Eat more fiber by eating fresh fruit, vegetables, and whole grains. Drink enough fluids to keep your pee (urine) clear or pale yellow.  Take warm water baths (sitz baths) to soothe pain or discomfort caused by hemorrhoids. Use hemorrhoid cream if your doctor approves.  If you have puffy, bulging veins (varicose veins), wear support hose. Raise (elevate) your feet for 15 minutes, 3-4 times a day. Limit salt in your diet.  Avoid heavy lifting, wear low heels, and sit up straight.  Rest with your legs raised if you have leg cramps or low back pain.  Visit your dentist if you have not gone during your pregnancy. Use a soft toothbrush to brush your  teeth. Be gentle when you floss.  You can have sex (intercourse) unless your doctor tells you not to.  Do not travel far distances unless you must. Only do so with your doctor's approval.  Take prenatal classes.  Practice driving to the hospital.  Pack your hospital bag.  Prepare the baby's room.  Go to your doctor visits. Get help if:  You are not sure if you are in labor or if your water has broken.  You are dizzy.  You have mild cramps or pressure in your lower belly (abdominal).  You have a nagging pain in your belly area.  You continue to feel sick to your stomach (nauseous), throw up (vomit), or have watery poop (diarrhea).  You have bad smelling fluid coming from your vagina.  You have pain with peeing (urination). Get help right away if:  You have a fever.  You are leaking fluid from your vagina.  You are spotting or bleeding from your vagina.  You have severe belly cramping or pain.  You lose or gain weight rapidly.  You have trouble catching your breath and have chest pain.  You notice sudden or extreme puffiness (swelling) of your face, hands, ankles, feet, or legs.  You have not felt the baby move in over an hour.  You have severe headaches that do not go away with medicine.  You have vision changes. This information is not intended to replace advice given to you by your health care provider. Make   sure you discuss any questions you have with your health care provider. Document Released: 04/06/2009 Document Revised: 06/18/2015 Document Reviewed: 03/13/2012 Elsevier Interactive Patient Education  2017 Elsevier Inc.  Sickle Cell Testing Why am I having this test? Your health care provider has determined that you may be at risk for sickle cell disease or sickle cell trait. You will be tested for the presence of hemoglobin S (HbS), which causes sickle cell trait or disease. In sickle cell disease, one abnormal gene is inherited from each of your  parents. When a person has sickle cell trait or disease, his or her red blood cells can take the shape of sickles rather than a normal round shape. This allows the cells to get stuck in vessels and cause problems. What kind of sample is taken? A blood sample is required for this test. It is usually collected by inserting a needle into a vein. How do I prepare for this test? You do not need to fast for this test. The test can fail to detect hemoglobin S under certain circumstances. Tell your health care provider:  If you have had a blood transfusion within the past 3-4 months.  If you have a red blood cell disease that increases red blood cell production (polycythemia).  All of the medicines you are taking, such as phenothiazines.  How are the results reported? Your test results will be reported as either positive or negative. It is your responsibility to obtain your test results. Ask the lab or department performing the test when and how you will get your results. What do the results mean? A negative result means that you most likely do not have sickle cell disease or trait. A positive result means that you have hemoglobin S in your blood and that you very likely have sickle cell disease or trait. Talk with your health care provider to discuss your results, treatment options, and if necessary, the need for more tests. Talk with your health care provider if you have any questions about your results. Talk with your health care provider to discuss your results, treatment options, and if necessary, the need for more tests. Talk with your health care provider if you have any questions about your results. This information is not intended to replace advice given to you by your health care provider. Make sure you discuss any questions you have with your health care provider. Document Released: 01/10/2005 Document Revised: 09/06/2015 Document Reviewed: 06/05/2013 Elsevier Interactive Patient Education   [employer]2018 Elsevier Inc.

## 2017-08-17 LAB — HIV ANTIBODY (ROUTINE TESTING W REFLEX): HIV SCREEN 4TH GENERATION: NONREACTIVE

## 2017-08-17 LAB — CBC
HEMOGLOBIN: 9.9 g/dL — AB (ref 11.1–15.9)
Hematocrit: 31.4 % — ABNORMAL LOW (ref 34.0–46.6)
MCH: 29.6 pg (ref 26.6–33.0)
MCHC: 31.5 g/dL (ref 31.5–35.7)
MCV: 94 fL (ref 79–97)
NRBC: 1 % — AB (ref 0–0)
PLATELETS: 330 10*3/uL (ref 150–450)
RBC: 3.35 x10E6/uL — ABNORMAL LOW (ref 3.77–5.28)
RDW: 19.5 % — ABNORMAL HIGH (ref 12.3–15.4)
WBC: 10.4 10*3/uL (ref 3.4–10.8)

## 2017-08-17 LAB — GLUCOSE TOLERANCE, 2 HOURS W/ 1HR
GLUCOSE, 1 HOUR: 119 mg/dL (ref 65–179)
GLUCOSE, 2 HOUR: 83 mg/dL (ref 65–152)
Glucose, Fasting: 92 mg/dL — ABNORMAL HIGH (ref 65–91)

## 2017-08-17 LAB — RPR: RPR: NONREACTIVE

## 2017-08-23 ENCOUNTER — Other Ambulatory Visit: Payer: Self-pay

## 2017-08-23 ENCOUNTER — Encounter (HOSPITAL_COMMUNITY): Payer: Self-pay

## 2017-08-23 ENCOUNTER — Inpatient Hospital Stay (HOSPITAL_COMMUNITY)
Admission: AD | Admit: 2017-08-23 | Discharge: 2017-08-23 | Disposition: A | Discharge status: Home / Self Care | Payer: BLUE CROSS/BLUE SHIELD | Source: Ambulatory Visit | Attending: Obstetrics & Gynecology | Admitting: Obstetrics & Gynecology

## 2017-08-23 DIAGNOSIS — Z87891 Personal history of nicotine dependence: Secondary | ICD-10-CM | POA: Insufficient documentation

## 2017-08-23 DIAGNOSIS — O26893 Other specified pregnancy related conditions, third trimester: Secondary | ICD-10-CM | POA: Diagnosis not present

## 2017-08-23 DIAGNOSIS — Z3A29 29 weeks gestation of pregnancy: Secondary | ICD-10-CM | POA: Insufficient documentation

## 2017-08-23 DIAGNOSIS — R109 Unspecified abdominal pain: Secondary | ICD-10-CM | POA: Diagnosis present

## 2017-08-23 DIAGNOSIS — O2243 Hemorrhoids in pregnancy, third trimester: Secondary | ICD-10-CM | POA: Diagnosis not present

## 2017-08-23 DIAGNOSIS — R12 Heartburn: Secondary | ICD-10-CM | POA: Insufficient documentation

## 2017-08-23 DIAGNOSIS — O99613 Diseases of the digestive system complicating pregnancy, third trimester: Secondary | ICD-10-CM | POA: Diagnosis not present

## 2017-08-23 HISTORY — DX: Maternal care for other abnormalities of cervix, unspecified trimester: O34.40

## 2017-08-23 HISTORY — DX: Fracture of neck, unspecified, initial encounter: S12.9XXA

## 2017-08-23 HISTORY — DX: Adjustment disorder with depressed mood: F43.21

## 2017-08-23 HISTORY — DX: Fracture of unspecified parts of lumbosacral spine and pelvis, initial encounter for closed fracture: S32.9XXA

## 2017-08-23 HISTORY — DX: Unspecified intracranial injury with loss of consciousness of unspecified duration, initial encounter: S06.9X9A

## 2017-08-23 HISTORY — DX: Low grade squamous intraepithelial lesion on cytologic smear of cervix (LGSIL): R87.612

## 2017-08-23 HISTORY — DX: Post-traumatic stress disorder, unspecified: F43.10

## 2017-08-23 HISTORY — DX: Unspecified fracture of unspecified lumbar vertebra, initial encounter for closed fracture: S32.009A

## 2017-08-23 HISTORY — DX: Unspecified intracranial injury with loss of consciousness status unknown, initial encounter: S06.9XAA

## 2017-08-23 LAB — URINALYSIS, ROUTINE W REFLEX MICROSCOPIC
Bilirubin Urine: NEGATIVE
Glucose, UA: NEGATIVE mg/dL
Hgb urine dipstick: NEGATIVE
Ketones, ur: NEGATIVE mg/dL
LEUKOCYTES UA: NEGATIVE
NITRITE: NEGATIVE
PH: 6 (ref 5.0–8.0)
Protein, ur: NEGATIVE mg/dL
Specific Gravity, Urine: 1.006 (ref 1.005–1.030)

## 2017-08-23 MED ORDER — FAMOTIDINE 20 MG PO TABS: 20.0000 mg | ORAL_TABLET | Freq: Two times a day (BID) | ORAL | 0 refills | Status: DC

## 2017-08-23 MED ORDER — GI COCKTAIL ~~LOC~~
30.0000 mL | Freq: Once | ORAL | Status: AC
  Administered 2017-08-23: 30 mL via ORAL
  Filled 2017-08-23: qty 30

## 2017-08-23 MED ORDER — PHENYLEPHRINE-MINERAL OIL-PET 0.25-14-74.9 % RE OINT: 1.0000 "application " | TOPICAL_OINTMENT | Freq: Two times a day (BID) | RECTAL | 0 refills | Status: DC | PRN

## 2017-08-23 NOTE — MAU Note (Signed)
PT SAYS  HER RECTUM HURTS- STARTED  Sunday -  HAS HAD HARD  AND SOFT  STOOL.  NO BM TODAY .  DOESN'T THINK SHE HAS HEMORR.   HURTS  TO WIPE RECTUM .Marland Kitchen.  NO BLEEDING.    TOP OF  ABD STARTED HURTING TODAY- FELT SOB  TONIGHT. - NOTHING CHANGES PAIN.

## 2017-08-23 NOTE — MAU Provider Note (Signed)
Chief Complaint:  Abdominal Pain and Rectal Pain   First Provider Initiated Contact with Patient 08/23/17 0205    HPI: Elizabeth SicklesJasmine Dyer is a 27 y.o. G1P0000 at 6529w3dwho presents to maternity admissions reporting abdominal pain and rectal pain. She reports upper epigastric pain started occurring this morning, she denies hx of heartburn during this pregnancy. Rates pain 7/10- reports pain makes her SOB, has not taken anything for pain medication or heartburn medication. She reports rectal pain that started occurring this past Sunday. She reports constipation since getting pregnant, has not had a BM today but reports hard and soft stools over the past couple of days. She reports rectal pain occurs when she has a BM and when she wipes. Rates pain 7/10- has not tried any medication for relief of pain. She does not believe she has a hemorrhoid but has not looked or palpated for one. She denies lower abdominal cramping or contractions. She reports good fetal movement, denies LOF, vaginal bleeding, vaginal itching/burning, urinary symptoms, h/a, dizziness or fever/chills.    Past Medical History: Past Medical History:  Diagnosis Date  . Adjustment disorder with depressed mood   . Antepartum low grade squamous intraepithelial cervical dysplasia complicating pregnancy   . Cervical transverse process fracture (HCC)   . Lumbar transverse process fracture (HCC)   . No pertinent past medical history   . Pelvic fracture (HCC)   . PTSD (post-traumatic stress disorder)   . Sickle cell trait (HCC)   . TBI (traumatic brain injury) (HCC)     Past obstetric history: OB History  Gravida Para Term Preterm AB Living  1 0 0 0 0 0  SAB TAB Ectopic Multiple Live Births  0 0 0        # Outcome Date GA Lbr Len/2nd Weight Sex Delivery Anes PTL Lv  1 Current             Past Surgical History: Past Surgical History:  Procedure Laterality Date  . APPLICATION OF WOUND VAC Left 06/30/2015   Procedure: APPLICATION OF  WOUND VAC ;  Surgeon: Myrene GalasMichael Handy, MD;  [facility];  Service: Orthopedics;  Laterality: Left;  L ankle and L calf  . CLOSED REDUCTION NASAL FRACTURE N/A 06/28/2015   Procedure: CLOSED REDUCTION NASoSepto FRACTURE;  Surgeon: Newman PiesSu Teoh, MD;  Location: [facility] OR;  Service: ENT;  Laterality: N/A;  . ESOPHAGOSCOPY N/A 06/28/2015   Procedure: ESOPHAGOSCOPY;  Surgeon: Newman PiesSu Teoh, MD;  Location: [facility] OR;  Service: ENT;  Laterality: N/A;  . EXTERNAL FIXATION LEG Left 06/28/2015   Procedure: EXTERNAL FIXATION LEG;  Surgeon: Samson FredericBrian Swinteck, MD;  Location: [facility] OR;  Service: Orthopedics;  Laterality: Left;  . EXTERNAL FIXATION LEG Left 07/03/2015   Procedure: REMOVAL OF EXTERNAL FIXATION LEFT LEG;  Surgeon: Myrene GalasMichael Handy, MD;  [facility];  Service: Orthopedics;  Laterality: Left;  . FEMUR IM NAIL Left 07/03/2015   Procedure: INTRAMEDULLARY (IM) NAIL FEMORAL;  Surgeon: Myrene GalasMichael Handy, MD;  [facility];  Service: Orthopedics;  Laterality: Left;  . I&D EXTREMITY Left 06/28/2015   Procedure: IRRIGATION AND DEBRIDEMENT EXTREMITY;  Surgeon: Samson FredericBrian Swinteck, MD;  Location: [facility] OR;  Service: Orthopedics;  Laterality: Left;  . I&D EXTREMITY Left 06/30/2015   Procedure: IRRIGATION AND DEBRIDEMENT 3A TIBIA PROXIMAL PLATEAU AND SHAFT WITH  EXTERNAL FIXATOR ADJUSTMENT;  Surgeon: Myrene GalasMichael Handy, MD;  Location: [facility] OR;  Service: Orthopedics;  Laterality: Left;  . I&D EXTREMITY Left 07/03/2015   Procedure: IRRIGATION AND DEBRIDEMENT LEFT OPEN TIBIA;  Surgeon:  Myrene Galas, MD;  Location: [facility] OR;  Service: Orthopedics;  Laterality: Left;  . IRRIGATION AND DEBRIDEMENT FOOT Left 06/30/2015   Procedure: IRRIGATION AND DEBRIDEMENT OPEN 3A LEFT ANKLE JOINT DISLOCATION AND OPEN LEFT BIMALLEOUS FRACTURE ;  Surgeon: Myrene Galas, MD;  Location: [facility] OR;  Service: Orthopedics;  Laterality: Left;  . LARYNGOSCOPY AND BRONCHOSCOPY N/A 06/28/2015   Procedure: LARYNGOSCOPY AND BRONCHOSCOPY;  Surgeon: Newman Pies, MD;  Location: [facility] OR;  Service: ENT;  Laterality:  N/A;  . ORIF ACETABULAR FRACTURE N/A 07/03/2015   Procedure: OPEN REDUCTION INTERNAL FIXATION (ORIF) ACETABULAR FRACTURE;  Surgeon: Myrene Galas, MD;  Location: [facility] OR;  Service: Orthopedics;  Laterality: N/A;  . ORIF ANKLE FRACTURE Left 06/30/2015   Procedure: OPEN REDUCTION INTERNAL FIXATION (ORIF) ANTERIOR COLUMN ACETABULUM;  Surgeon: Myrene Galas, MD;  Location[facility] OR;  Service: Orthopedics;  Laterality: Left;  . ORIF PELVIC FRACTURE Bilateral 06/30/2015   Procedure:  TRANS-SACRAL SCREWS ;  Surgeon: Myrene Galas, MD;  Location: [facility] OR;  Service: Orthopedics;  Laterality: Bilateral;  . ORIF TIBIA PLATEAU Left 07/03/2015   Procedure: OPEN REDUCTION INTERNAL FIXATION (ORIF) TIBIAL PLATEAU;  Surgeon: Myrene Galas, MD;  Location: [facility] OR;  Service: Orthopedics;  Laterality: Left;    Family History: History reviewed. No pertinent family history.  Social History: Social History   Tobacco Use  . Smoking status: Former Smoker    Packs/day: 0.25    Types: Cigarettes    Last attempt to quit: 02/2017    Years since quitting: 0.4  . Smokeless tobacco: Never Used  Substance Use Topics  . Alcohol use: No    Alcohol/week: 0.0 oz  . Drug use: No    Allergies: No Known Allergies  Meds:  Medications Prior to Admission  Medication Sig Dispense Refill Last Dose  . Prenat-FePoly-Metf-FA-DHA-DSS (VITAFOL FE+) 90-1-200 & 50 MG CPPK Take 2 tablets by mouth at bedtime. 60 each 12 08/23/2017 at Unknown time  . Vitamin D, Ergocalciferol, (DRISDOL) 50000 units CAPS capsule Take 1 capsule (50,000 Units total) by mouth every 7 (seven) days. 30 capsule 2 Past Week at Unknown time  . amoxicillin-clavulanate (AUGMENTIN) 875-125 MG tablet Take 1 tablet by mouth daily. 30 tablet 6 Taking  . folic acid (FOLVITE) 1 MG tablet Take 5 tablets (5 mg total) by mouth daily. (Patient not taking: Reported on 06/08/2017) 150 tablet 2 Not Taking    ROS:  Review of Systems  Constitutional: Negative.   Respiratory: Negative.    Cardiovascular: Negative.   Gastrointestinal: Positive for abdominal pain, constipation and rectal pain. Negative for diarrhea, nausea and vomiting.       Epigastric pain  Genitourinary: Negative.   Musculoskeletal: Negative.    I have reviewed patient's Past Medical Hx, Surgical Hx, Family Hx, Social Hx, medications and allergies.   Physical Exam   Vitals:   08/23/17 0122 08/23/17 0257  BP: 111/63 115/64  Pulse: 98 90  Resp: 20 18  Temp: 98.3 F (36.8 C)   TempSrc: Oral   Weight: 223 lb 8 oz (101.4 kg)   Height: 5\' 11"  (1.803 m)    Constitutional: Well-developed, well-nourished female in no acute distress.  Cardiovascular: normal rate Respiratory: normal effort GI: Abd soft, non-tender, gravid appropriate for gestational age.  MS: Extremities nontender, no edema, normal ROM Neurologic: Alert and oriented x 4.  GU: Neg CVAT. Rectal: 2cm hemorrhoid noted, tender to touch, easily reduced- not thrombosed   FHT:  Baseline 145 , moderate variability, accelerations present, no decelerations Contractions: None  Labs: Results for orders placed or performed during the hospital encounter of 08/23/17 (from the past 24 hour(s))  Urinalysis, Routine w reflex microscopic     Status: Abnormal   Collection Time: 08/23/17  1:52 AM  Result Value Ref Range   Color, Urine YELLOW YELLOW   APPearance HAZY (A) CLEAR   Specific Gravity, Urine 1.006 1.005 - 1.030   pH 6.0 5.0 - 8.0   Glucose, UA NEGATIVE NEGATIVE mg/dL   Hgb urine dipstick NEGATIVE NEGATIVE   Bilirubin Urine NEGATIVE NEGATIVE   Ketones, ur NEGATIVE NEGATIVE mg/dL   Protein, ur NEGATIVE NEGATIVE mg/dL   Nitrite NEGATIVE NEGATIVE   Leukocytes, UA NEGATIVE NEGATIVE   A/Positive/-- (04/03 1606)  MAU Course/MDM: Orders Placed This Encounter  Procedures  . Urinalysis, Routine w reflex microscopic  UA- negative   Meds ordered this encounter  Medications  . gi cocktail (Maalox,Lidocaine,Donnatal)  . famotidine (PEPCID)  20 MG tablet    Sig: Take 1 tablet (20 mg total) by mouth 2 (two) times daily.    Dispense:  30 tablet    Refill:  0    Order Specific Question:   Supervising Provider    Answer:   Jaynie Collins A [[id number]]  . phenylephrine-shark liver oil-mineral oil-petrolatum (PREPARATION H) 0.25-14-74.9 % rectal ointment    Sig: Place 1 application rectally 2 (two) times daily as needed for hemorrhoids.    Dispense:  1 Tube    Refill:  0    Order Specific Question:   Supervising Provider    Answer:   Jaynie Collins A [[id number]]   NST reviewed- reactive for gestational age  Treatments in MAU included GI cocktail- patient reports relieve of epigastric pain and SOB with medication treatment, rates pain 0/10.  Rx for Pepcid sent to pharmacy of choice.   Discussed medication treatment for hemorrhoids. Educated and discussed increasing amount of fiber in diet to decrease occurrence of constipation. Patient verbalizes understanding. Rx for Preparation H, can use OTC Tucks wipes as well.   Pt discharge. Pt stable at time of discharge  Assessment: 1. Hemorrhoids during pregnancy in third trimester   2. Heartburn during pregnancy in third trimester     Plan: Discharge home Preterm Labor precautions and fetal kick counts Rx for Pepcid and Preparation H  Follow up as scheduled in the office for prenatal appointments  Return to MAU as needed for emergencies    Follow-up Information    [facility] Follow up.   Specialty:  Obstetrics and Gynecology Why:  Follow up as scheduled for prenatal appointments  Contact information: [address] [place] [address] [phone]          Allergies as of 08/23/2017   No Known Allergies     Medication List    TAKE these medications   amoxicillin-clavulanate 875-125 MG tablet Commonly known as:  AUGMENTIN Take 1 tablet by mouth daily.   famotidine 20 MG tablet Commonly known as:  PEPCID Take 1  tablet (20 mg total) by mouth 2 (two) times daily.   folic acid 1 MG tablet Commonly known as:  FOLVITE Take 5 tablets (5 mg total) by mouth daily.   phenylephrine-shark liver oil-mineral oil-petrolatum 0.25-14-74.9 % rectal ointment Commonly known as:  PREPARATION H Place 1 application rectally 2 (two) times daily as needed for hemorrhoids.   VITAFOL FE+ 90-1-200 & 50 MG Cppk Take 2 tablets by mouth at bedtime.   Vitamin D (Ergocalciferol) 50000 units  Caps capsule Commonly known as:  DRISDOL Take 1 capsule (50,000 Units total) by mouth every 7 (seven) days.       Steward Drone Certified Nurse-Midwife 08/23/2017 2:58 AM

## 2017-08-30 ENCOUNTER — Encounter: Payer: Self-pay | Admitting: Obstetrics and Gynecology

## 2017-08-30 ENCOUNTER — Ambulatory Visit (INDEPENDENT_AMBULATORY_CARE_PROVIDER_SITE_OTHER): Payer: BLUE CROSS/BLUE SHIELD | Admitting: Obstetrics and Gynecology

## 2017-08-30 VITALS — BP 101/68 | HR 91 | Wt 220.9 lb

## 2017-08-30 DIAGNOSIS — Z3400 Encounter for supervision of normal first pregnancy, unspecified trimester: Secondary | ICD-10-CM

## 2017-08-30 DIAGNOSIS — Z3481 Encounter for supervision of other normal pregnancy, first trimester: Secondary | ICD-10-CM

## 2017-08-30 DIAGNOSIS — D582 Other hemoglobinopathies: Secondary | ICD-10-CM

## 2017-08-30 NOTE — Progress Notes (Signed)
Patient reports good fetal movement with occasional pressure. 
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
## 2017-09-01 LAB — CULTURE, OB URINE

## 2017-09-01 LAB — URINE CULTURE, OB REFLEX

## 2017-09-05 ENCOUNTER — Other Ambulatory Visit (HOSPITAL_COMMUNITY): Payer: Self-pay | Admitting: *Deleted

## 2017-09-05 ENCOUNTER — Ambulatory Visit (HOSPITAL_COMMUNITY)
Admission: RE | Admit: 2017-09-05 | Discharge: 2017-09-05 | Disposition: A | Discharge status: Home / Self Care | Payer: BLUE CROSS/BLUE SHIELD | Source: Ambulatory Visit | Attending: Obstetrics & Gynecology | Admitting: Obstetrics & Gynecology

## 2017-09-05 ENCOUNTER — Other Ambulatory Visit: Payer: Self-pay | Admitting: Obstetrics & Gynecology

## 2017-09-05 DIAGNOSIS — D5720 Sickle-cell/Hb-C disease without crisis: Secondary | ICD-10-CM

## 2017-09-05 DIAGNOSIS — Z3A31 31 weeks gestation of pregnancy: Secondary | ICD-10-CM

## 2017-09-05 DIAGNOSIS — Z362 Encounter for other antenatal screening follow-up: Secondary | ICD-10-CM

## 2017-09-05 DIAGNOSIS — Z3400 Encounter for supervision of normal first pregnancy, unspecified trimester: Secondary | ICD-10-CM

## 2017-09-05 DIAGNOSIS — O99012 Anemia complicating pregnancy, second trimester: Secondary | ICD-10-CM | POA: Diagnosis not present

## 2017-09-05 DIAGNOSIS — D571 Sickle-cell disease without crisis: Secondary | ICD-10-CM | POA: Diagnosis not present

## 2017-09-13 ENCOUNTER — Ambulatory Visit (INDEPENDENT_AMBULATORY_CARE_PROVIDER_SITE_OTHER): Payer: BLUE CROSS/BLUE SHIELD | Admitting: Obstetrics & Gynecology

## 2017-09-13 VITALS — BP 108/71 | HR 112 | Wt 223.1 lb

## 2017-09-13 DIAGNOSIS — O99810 Abnormal glucose complicating pregnancy: Secondary | ICD-10-CM | POA: Insufficient documentation

## 2017-09-13 DIAGNOSIS — O99013 Anemia complicating pregnancy, third trimester: Secondary | ICD-10-CM

## 2017-09-13 DIAGNOSIS — D5720 Sickle-cell/Hb-C disease without crisis: Secondary | ICD-10-CM

## 2017-09-13 DIAGNOSIS — Z3400 Encounter for supervision of normal first pregnancy, unspecified trimester: Secondary | ICD-10-CM

## 2017-09-13 DIAGNOSIS — R87612 Low grade squamous intraepithelial lesion on cytologic smear of cervix (LGSIL): Secondary | ICD-10-CM

## 2017-09-13 DIAGNOSIS — Z3403 Encounter for supervision of normal first pregnancy, third trimester: Secondary | ICD-10-CM

## 2017-09-13 HISTORY — DX: Abnormal glucose complicating pregnancy: O99.810

## 2017-09-13 LAB — POCT CBG (FASTING - GLUCOSE)-MANUAL ENTRY: Glucose Fasting, POC: 111 mg/dL — AB (ref 70–99)
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
## 2017-09-13 NOTE — Patient Instructions (Signed)

## 2017-09-27 ENCOUNTER — Other Ambulatory Visit: Payer: Self-pay

## 2017-09-27 ENCOUNTER — Ambulatory Visit (INDEPENDENT_AMBULATORY_CARE_PROVIDER_SITE_OTHER): Payer: BLUE CROSS/BLUE SHIELD | Admitting: Family Medicine

## 2017-09-27 VITALS — BP 119/72 | HR 93 | Wt 226.3 lb

## 2017-09-27 DIAGNOSIS — O99810 Abnormal glucose complicating pregnancy: Secondary | ICD-10-CM | POA: Diagnosis not present

## 2017-09-27 DIAGNOSIS — Z3403 Encounter for supervision of normal first pregnancy, third trimester: Secondary | ICD-10-CM

## 2017-09-27 DIAGNOSIS — Z3400 Encounter for supervision of normal first pregnancy, unspecified trimester: Secondary | ICD-10-CM

## 2017-09-27 LAB — GLUCOSE, POCT (MANUAL RESULT ENTRY): POC Glucose: 93 mg/dl (ref 70–99)

## 2017-09-27 NOTE — Progress Notes (Signed)
ROB.  Declined FLU & TDAP vaccines. Spoke with patient about Glucose results and DM teaching at MFM.
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
## 2017-10-02 ENCOUNTER — Encounter (HOSPITAL_COMMUNITY): Payer: Self-pay | Admitting: *Deleted

## 2017-10-02 ENCOUNTER — Other Ambulatory Visit: Payer: Self-pay

## 2017-10-02 ENCOUNTER — Inpatient Hospital Stay (HOSPITAL_COMMUNITY)
Admission: AD | Admit: 2017-10-02 | Discharge: 2017-10-02 | Disposition: A | Discharge status: Home / Self Care | Payer: BLUE CROSS/BLUE SHIELD | Source: Ambulatory Visit | Attending: Obstetrics and Gynecology | Admitting: Obstetrics and Gynecology

## 2017-10-02 DIAGNOSIS — O26899 Other specified pregnancy related conditions, unspecified trimester: Secondary | ICD-10-CM | POA: Diagnosis present

## 2017-10-02 DIAGNOSIS — O26893 Other specified pregnancy related conditions, third trimester: Secondary | ICD-10-CM | POA: Diagnosis not present

## 2017-10-02 DIAGNOSIS — R109 Unspecified abdominal pain: Secondary | ICD-10-CM

## 2017-10-02 DIAGNOSIS — R102 Pelvic and perineal pain: Secondary | ICD-10-CM | POA: Diagnosis not present

## 2017-10-02 DIAGNOSIS — R350 Frequency of micturition: Secondary | ICD-10-CM | POA: Insufficient documentation

## 2017-10-02 DIAGNOSIS — Z87891 Personal history of nicotine dependence: Secondary | ICD-10-CM | POA: Insufficient documentation

## 2017-10-02 DIAGNOSIS — Z3A35 35 weeks gestation of pregnancy: Secondary | ICD-10-CM | POA: Insufficient documentation

## 2017-10-02 HISTORY — DX: Adverse effect of unspecified anesthetic, initial encounter: T41.45XA

## 2017-10-02 HISTORY — DX: Other specified pregnancy related conditions, unspecified trimester: R10.9

## 2017-10-02 HISTORY — DX: Other specified pregnancy related conditions, unspecified trimester: O26.899

## 2017-10-02 HISTORY — DX: Other complications of anesthesia, initial encounter: T88.59XA

## 2017-10-02 LAB — URINALYSIS, ROUTINE W REFLEX MICROSCOPIC
Bilirubin Urine: NEGATIVE
GLUCOSE, UA: NEGATIVE mg/dL
Hgb urine dipstick: NEGATIVE
KETONES UR: NEGATIVE mg/dL
Nitrite: NEGATIVE
PROTEIN: NEGATIVE mg/dL
Specific Gravity, Urine: 1.011 (ref 1.005–1.030)
pH: 6 (ref 5.0–8.0)

## 2017-10-02 MED ORDER — ACETAMINOPHEN 500 MG PO TABS
1000.0000 mg | ORAL_TABLET | Freq: Once | ORAL | Status: AC
Start: 2017-10-02 — End: 2017-10-02
  Administered 2017-10-02: 1000 mg via ORAL
  Filled 2017-10-02: qty 2
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
## 2017-10-03 ENCOUNTER — Other Ambulatory Visit (HOSPITAL_COMMUNITY): Payer: Self-pay | Admitting: Obstetrics and Gynecology

## 2017-10-03 ENCOUNTER — Encounter (HOSPITAL_COMMUNITY): Payer: Self-pay

## 2017-10-03 ENCOUNTER — Ambulatory Visit (HOSPITAL_COMMUNITY)
Admission: RE | Admit: 2017-10-03 | Discharge: 2017-10-03 | Disposition: A | Discharge status: Home / Self Care | Payer: BLUE CROSS/BLUE SHIELD | Source: Ambulatory Visit | Attending: Obstetrics & Gynecology | Admitting: Obstetrics & Gynecology

## 2017-10-03 DIAGNOSIS — O99013 Anemia complicating pregnancy, third trimester: Secondary | ICD-10-CM | POA: Diagnosis not present

## 2017-10-03 DIAGNOSIS — Z362 Encounter for other antenatal screening follow-up: Secondary | ICD-10-CM | POA: Diagnosis not present

## 2017-10-03 DIAGNOSIS — Z3A35 35 weeks gestation of pregnancy: Secondary | ICD-10-CM

## 2017-10-03 DIAGNOSIS — O26899 Other specified pregnancy related conditions, unspecified trimester: Secondary | ICD-10-CM

## 2017-10-03 DIAGNOSIS — R109 Unspecified abdominal pain: Secondary | ICD-10-CM

## 2017-10-03 LAB — CULTURE, OB URINE: SPECIAL REQUESTS: NORMAL

## 2017-10-04 ENCOUNTER — Other Ambulatory Visit (HOSPITAL_COMMUNITY): Payer: Self-pay | Admitting: *Deleted

## 2017-10-04 DIAGNOSIS — D571 Sickle-cell disease without crisis: Secondary | ICD-10-CM

## 2017-10-04 DIAGNOSIS — O99013 Anemia complicating pregnancy, third trimester: Principal | ICD-10-CM

## 2017-10-11 ENCOUNTER — Encounter (HOSPITAL_COMMUNITY): Payer: Self-pay

## 2017-10-11 ENCOUNTER — Ambulatory Visit (HOSPITAL_COMMUNITY)
Admission: RE | Admit: 2017-10-11 | Discharge: 2017-10-11 | Disposition: A | Discharge status: Home / Self Care | Payer: BLUE CROSS/BLUE SHIELD | Source: Ambulatory Visit | Attending: Obstetrics & Gynecology | Admitting: Obstetrics & Gynecology

## 2017-10-11 DIAGNOSIS — R109 Unspecified abdominal pain: Secondary | ICD-10-CM

## 2017-10-11 DIAGNOSIS — D571 Sickle-cell disease without crisis: Secondary | ICD-10-CM | POA: Diagnosis not present

## 2017-10-11 DIAGNOSIS — O24410 Gestational diabetes mellitus in pregnancy, diet controlled: Secondary | ICD-10-CM | POA: Diagnosis not present

## 2017-10-11 DIAGNOSIS — Z3A36 36 weeks gestation of pregnancy: Secondary | ICD-10-CM | POA: Diagnosis not present

## 2017-10-11 DIAGNOSIS — O99013 Anemia complicating pregnancy, third trimester: Secondary | ICD-10-CM | POA: Insufficient documentation

## 2017-10-11 DIAGNOSIS — O26899 Other specified pregnancy related conditions, unspecified trimester: Secondary | ICD-10-CM

## 2017-10-12 ENCOUNTER — Encounter: Payer: BLUE CROSS/BLUE SHIELD | Admitting: Obstetrics and Gynecology

## 2017-10-17 ENCOUNTER — Ambulatory Visit (HOSPITAL_COMMUNITY)
Admission: RE | Admit: 2017-10-17 | Discharge: 2017-10-17 | Disposition: A | Discharge status: Home / Self Care | Payer: BLUE CROSS/BLUE SHIELD | Source: Ambulatory Visit | Attending: Obstetrics & Gynecology | Admitting: Obstetrics & Gynecology

## 2017-10-17 ENCOUNTER — Other Ambulatory Visit (HOSPITAL_COMMUNITY): Payer: Self-pay | Admitting: Maternal and Fetal Medicine

## 2017-10-17 ENCOUNTER — Encounter (HOSPITAL_COMMUNITY): Payer: Self-pay

## 2017-10-17 DIAGNOSIS — O26899 Other specified pregnancy related conditions, unspecified trimester: Secondary | ICD-10-CM

## 2017-10-17 DIAGNOSIS — R109 Unspecified abdominal pain: Secondary | ICD-10-CM | POA: Diagnosis not present

## 2017-10-17 DIAGNOSIS — O99013 Anemia complicating pregnancy, third trimester: Principal | ICD-10-CM

## 2017-10-17 DIAGNOSIS — O24410 Gestational diabetes mellitus in pregnancy, diet controlled: Secondary | ICD-10-CM | POA: Diagnosis not present

## 2017-10-17 DIAGNOSIS — Z3A37 37 weeks gestation of pregnancy: Secondary | ICD-10-CM | POA: Diagnosis not present

## 2017-10-17 DIAGNOSIS — O26893 Other specified pregnancy related conditions, third trimester: Secondary | ICD-10-CM

## 2017-10-17 DIAGNOSIS — D571 Sickle-cell disease without crisis: Secondary | ICD-10-CM | POA: Insufficient documentation

## 2017-10-17 NOTE — Procedures (Signed)
Margart SicklesJasmine Geise [birth year] 748w2d  Fetus A Non-Stress Test Interpretation for 10/17/17  Indication: Unsatisfactory BPP  Fetal Heart Rate A Mode: External Baseline Rate (A): 135 bpm Variability: Moderate Accelerations: 15 x 15 Decelerations: None Multiple birth?: No  Uterine Activity Mode: Toco Contraction Frequency (min): none noted  Interpretation (Fetal Testing) Nonstress Test Interpretation: Reactive Comments: FHR tracing rev'd by Dr. Perry MountJacobson

## 2017-10-19 ENCOUNTER — Ambulatory Visit (INDEPENDENT_AMBULATORY_CARE_PROVIDER_SITE_OTHER): Payer: BLUE CROSS/BLUE SHIELD | Admitting: Obstetrics and Gynecology

## 2017-10-19 ENCOUNTER — Encounter: Payer: Self-pay | Admitting: Obstetrics and Gynecology

## 2017-10-19 ENCOUNTER — Other Ambulatory Visit (HOSPITAL_COMMUNITY)
Admission: RE | Admit: 2017-10-19 | Discharge: 2017-10-19 | Disposition: A | Discharge status: Home / Self Care | Payer: BLUE CROSS/BLUE SHIELD | Source: Ambulatory Visit | Attending: Obstetrics and Gynecology | Admitting: Obstetrics and Gynecology

## 2017-10-19 VITALS — BP 100/66 | HR 103 | Wt 231.7 lb

## 2017-10-19 DIAGNOSIS — Z3403 Encounter for supervision of normal first pregnancy, third trimester: Secondary | ICD-10-CM | POA: Insufficient documentation

## 2017-10-19 DIAGNOSIS — O9989 Other specified diseases and conditions complicating pregnancy, childbirth and the puerperium: Secondary | ICD-10-CM

## 2017-10-19 DIAGNOSIS — O24419 Gestational diabetes mellitus in pregnancy, unspecified control: Secondary | ICD-10-CM

## 2017-10-19 DIAGNOSIS — O24410 Gestational diabetes mellitus in pregnancy, diet controlled: Secondary | ICD-10-CM

## 2017-10-19 DIAGNOSIS — Z3400 Encounter for supervision of normal first pregnancy, unspecified trimester: Secondary | ICD-10-CM

## 2017-10-19 DIAGNOSIS — R8271 Bacteriuria: Secondary | ICD-10-CM

## 2017-10-19 DIAGNOSIS — D582 Other hemoglobinopathies: Secondary | ICD-10-CM

## 2017-10-19 DIAGNOSIS — Z3A37 37 weeks gestation of pregnancy: Secondary | ICD-10-CM | POA: Insufficient documentation

## 2017-10-19 DIAGNOSIS — O99810 Abnormal glucose complicating pregnancy: Secondary | ICD-10-CM

## 2017-10-19 DIAGNOSIS — R87612 Low grade squamous intraepithelial lesion on cytologic smear of cervix (LGSIL): Secondary | ICD-10-CM

## 2017-10-19 HISTORY — DX: Gestational diabetes mellitus in pregnancy, unspecified control: O24.419

## 2017-10-19 LAB — GLUCOSE, POCT (MANUAL RESULT ENTRY): POC GLUCOSE: 101 mg/dL — AB (ref 70–99)

## 2017-10-19 LAB — OB RESULTS CONSOLE GC/CHLAMYDIA: GC PROBE AMP, GENITAL: NEGATIVE
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
## 2017-10-19 NOTE — Progress Notes (Signed)
Pt here for ROB. Pt complains of occasional headaches. She has taken tylenol with some relief.

## 2017-10-20 LAB — CERVICOVAGINAL ANCILLARY ONLY
Chlamydia: NEGATIVE
NEISSERIA GONORRHEA: NEGATIVE

## 2017-10-21 LAB — STREP GP B NAA: STREP GROUP B AG: POSITIVE — AB

## 2017-10-24 ENCOUNTER — Encounter (HOSPITAL_COMMUNITY): Payer: Self-pay

## 2017-10-24 ENCOUNTER — Ambulatory Visit (HOSPITAL_COMMUNITY)
Admission: RE | Admit: 2017-10-24 | Discharge: 2017-10-24 | Disposition: A | Discharge status: Home / Self Care | Payer: BLUE CROSS/BLUE SHIELD | Source: Ambulatory Visit | Attending: Obstetrics & Gynecology | Admitting: Obstetrics & Gynecology

## 2017-10-24 DIAGNOSIS — Z3A38 38 weeks gestation of pregnancy: Secondary | ICD-10-CM | POA: Diagnosis not present

## 2017-10-24 DIAGNOSIS — O24410 Gestational diabetes mellitus in pregnancy, diet controlled: Secondary | ICD-10-CM | POA: Diagnosis not present

## 2017-10-24 DIAGNOSIS — O26899 Other specified pregnancy related conditions, unspecified trimester: Secondary | ICD-10-CM

## 2017-10-24 DIAGNOSIS — O99013 Anemia complicating pregnancy, third trimester: Secondary | ICD-10-CM | POA: Diagnosis not present

## 2017-10-24 DIAGNOSIS — D571 Sickle-cell disease without crisis: Secondary | ICD-10-CM | POA: Diagnosis not present

## 2017-10-24 DIAGNOSIS — R109 Unspecified abdominal pain: Secondary | ICD-10-CM

## 2017-10-25 ENCOUNTER — Ambulatory Visit (INDEPENDENT_AMBULATORY_CARE_PROVIDER_SITE_OTHER): Payer: Self-pay | Admitting: Pediatrics

## 2017-10-25 ENCOUNTER — Other Ambulatory Visit (HOSPITAL_COMMUNITY): Payer: Self-pay | Admitting: *Deleted

## 2017-10-25 DIAGNOSIS — D571 Sickle-cell disease without crisis: Secondary | ICD-10-CM

## 2017-10-25 DIAGNOSIS — Z7681 Expectant parent(s) prebirth pediatrician visit: Secondary | ICD-10-CM

## 2017-10-25 DIAGNOSIS — O99013 Anemia complicating pregnancy, third trimester: Principal | ICD-10-CM

## 2017-10-25 NOTE — Progress Notes (Signed)
Prenatal counseling for impending newborn done--1st child, currently 38wks, prenatal started 7wks:  Mom reports she has sickle cell trait and no other complications.  Records reveal  disease and followed by MFM, gest DM Z76.81

## 2017-10-26 ENCOUNTER — Encounter: Payer: Self-pay | Admitting: Obstetrics and Gynecology

## 2017-10-26 ENCOUNTER — Ambulatory Visit (INDEPENDENT_AMBULATORY_CARE_PROVIDER_SITE_OTHER): Payer: BLUE CROSS/BLUE SHIELD | Admitting: Obstetrics and Gynecology

## 2017-10-26 VITALS — BP 110/78 | HR 108 | Wt 228.9 lb

## 2017-10-26 DIAGNOSIS — D582 Other hemoglobinopathies: Secondary | ICD-10-CM

## 2017-10-26 DIAGNOSIS — Z3403 Encounter for supervision of normal first pregnancy, third trimester: Secondary | ICD-10-CM

## 2017-10-26 DIAGNOSIS — Z3400 Encounter for supervision of normal first pregnancy, unspecified trimester: Secondary | ICD-10-CM

## 2017-10-26 DIAGNOSIS — O9989 Other specified diseases and conditions complicating pregnancy, childbirth and the puerperium: Secondary | ICD-10-CM

## 2017-10-26 DIAGNOSIS — O24410 Gestational diabetes mellitus in pregnancy, diet controlled: Secondary | ICD-10-CM

## 2017-10-26 DIAGNOSIS — R8271 Bacteriuria: Secondary | ICD-10-CM

## 2017-10-26 DIAGNOSIS — O99891 Other specified diseases and conditions complicating pregnancy: Secondary | ICD-10-CM

## 2017-10-26 DIAGNOSIS — R87612 Low grade squamous intraepithelial lesion on cytologic smear of cervix (LGSIL): Secondary | ICD-10-CM

## 2017-10-26 NOTE — Progress Notes (Signed)
Pt is here for ROB. G1P0 [redacted]w[redacted]d.
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
## 2017-10-27 ENCOUNTER — Encounter (HOSPITAL_COMMUNITY): Payer: Self-pay

## 2017-10-27 ENCOUNTER — Telehealth (HOSPITAL_COMMUNITY): Payer: Self-pay | Admitting: *Deleted

## 2017-10-27 NOTE — Telephone Encounter (Signed)
Preadmission screen  

## 2017-10-31 ENCOUNTER — Encounter (HOSPITAL_COMMUNITY): Payer: Self-pay

## 2017-10-31 ENCOUNTER — Ambulatory Visit (HOSPITAL_COMMUNITY)
Admission: RE | Admit: 2017-10-31 | Discharge: 2017-10-31 | Disposition: A | Discharge status: Home / Self Care | Payer: BLUE CROSS/BLUE SHIELD | Source: Ambulatory Visit | Attending: Obstetrics & Gynecology | Admitting: Obstetrics & Gynecology

## 2017-10-31 DIAGNOSIS — Z3A39 39 weeks gestation of pregnancy: Secondary | ICD-10-CM | POA: Diagnosis not present

## 2017-10-31 DIAGNOSIS — D571 Sickle-cell disease without crisis: Secondary | ICD-10-CM

## 2017-10-31 DIAGNOSIS — O26899 Other specified pregnancy related conditions, unspecified trimester: Secondary | ICD-10-CM

## 2017-10-31 DIAGNOSIS — O24410 Gestational diabetes mellitus in pregnancy, diet controlled: Secondary | ICD-10-CM | POA: Insufficient documentation

## 2017-10-31 DIAGNOSIS — R109 Unspecified abdominal pain: Secondary | ICD-10-CM

## 2017-10-31 DIAGNOSIS — O99013 Anemia complicating pregnancy, third trimester: Secondary | ICD-10-CM | POA: Diagnosis not present

## 2017-10-31 DIAGNOSIS — Z362 Encounter for other antenatal screening follow-up: Secondary | ICD-10-CM | POA: Diagnosis not present

## 2017-11-02 ENCOUNTER — Encounter: Payer: Self-pay | Admitting: Obstetrics

## 2017-11-02 ENCOUNTER — Ambulatory Visit (INDEPENDENT_AMBULATORY_CARE_PROVIDER_SITE_OTHER): Payer: BLUE CROSS/BLUE SHIELD | Admitting: Obstetrics

## 2017-11-02 VITALS — BP 113/71 | HR 104 | Wt 229.7 lb

## 2017-11-02 DIAGNOSIS — D571 Sickle-cell disease without crisis: Secondary | ICD-10-CM

## 2017-11-02 DIAGNOSIS — Z3400 Encounter for supervision of normal first pregnancy, unspecified trimester: Secondary | ICD-10-CM

## 2017-11-02 DIAGNOSIS — O24410 Gestational diabetes mellitus in pregnancy, diet controlled: Secondary | ICD-10-CM

## 2017-11-02 DIAGNOSIS — O99013 Anemia complicating pregnancy, third trimester: Secondary | ICD-10-CM
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
## 2017-11-02 NOTE — Progress Notes (Signed)
Pt presents for ROB. Pt has no other concerns. 

## 2017-11-05 ENCOUNTER — Inpatient Hospital Stay (HOSPITAL_COMMUNITY): Payer: BLUE CROSS/BLUE SHIELD | Admitting: Anesthesiology

## 2017-11-05 ENCOUNTER — Encounter (HOSPITAL_COMMUNITY): Payer: Self-pay

## 2017-11-05 ENCOUNTER — Inpatient Hospital Stay (HOSPITAL_COMMUNITY)
Admission: RE | Admit: 2017-11-05 | Discharge: 2017-11-08 | DRG: 807 | Disposition: A | Discharge status: Home / Self Care | Payer: BLUE CROSS/BLUE SHIELD | Attending: Obstetrics and Gynecology | Admitting: Obstetrics and Gynecology

## 2017-11-05 ENCOUNTER — Other Ambulatory Visit: Payer: Self-pay

## 2017-11-05 DIAGNOSIS — Z3A40 40 weeks gestation of pregnancy: Secondary | ICD-10-CM

## 2017-11-05 DIAGNOSIS — O9902 Anemia complicating childbirth: Secondary | ICD-10-CM | POA: Diagnosis present

## 2017-11-05 DIAGNOSIS — D573 Sickle-cell trait: Secondary | ICD-10-CM | POA: Diagnosis present

## 2017-11-05 DIAGNOSIS — O24419 Gestational diabetes mellitus in pregnancy, unspecified control: Secondary | ICD-10-CM | POA: Diagnosis present

## 2017-11-05 DIAGNOSIS — D582 Other hemoglobinopathies: Secondary | ICD-10-CM | POA: Diagnosis present

## 2017-11-05 DIAGNOSIS — O43123 Velamentous insertion of umbilical cord, third trimester: Secondary | ICD-10-CM | POA: Diagnosis present

## 2017-11-05 DIAGNOSIS — O99824 Streptococcus B carrier state complicating childbirth: Secondary | ICD-10-CM | POA: Diagnosis present

## 2017-11-05 DIAGNOSIS — Z3A00 Weeks of gestation of pregnancy not specified: Secondary | ICD-10-CM | POA: Diagnosis not present

## 2017-11-05 DIAGNOSIS — O480 Post-term pregnancy: Secondary | ICD-10-CM | POA: Diagnosis not present

## 2017-11-05 DIAGNOSIS — Z87891 Personal history of nicotine dependence: Secondary | ICD-10-CM | POA: Diagnosis not present

## 2017-11-05 DIAGNOSIS — F4311 Post-traumatic stress disorder, acute: Secondary | ICD-10-CM | POA: Diagnosis present

## 2017-11-05 DIAGNOSIS — O99810 Abnormal glucose complicating pregnancy: Secondary | ICD-10-CM | POA: Diagnosis present

## 2017-11-05 DIAGNOSIS — R109 Unspecified abdominal pain: Secondary | ICD-10-CM

## 2017-11-05 DIAGNOSIS — O26899 Other specified pregnancy related conditions, unspecified trimester: Secondary | ICD-10-CM

## 2017-11-05 DIAGNOSIS — O24420 Gestational diabetes mellitus in childbirth, diet controlled: Principal | ICD-10-CM | POA: Diagnosis present

## 2017-11-05 DIAGNOSIS — O24410 Gestational diabetes mellitus in pregnancy, diet controlled: Secondary | ICD-10-CM

## 2017-11-05 LAB — CBC
HCT: 28.4 % — ABNORMAL LOW (ref 36.0–46.0)
HEMOGLOBIN: 10.2 g/dL — AB (ref 12.0–15.0)
MCH: 31.2 pg (ref 26.0–34.0)
MCHC: 35.9 g/dL (ref 30.0–36.0)
MCV: 86.9 fL (ref 80.0–100.0)
Platelets: 301 10*3/uL (ref 150–400)
RBC: 3.27 MIL/uL — ABNORMAL LOW (ref 3.87–5.11)
RDW: 18.7 % — ABNORMAL HIGH (ref 11.5–15.5)
WBC: 9.5 10*3/uL (ref 4.0–10.5)
nRBC: 1.7 % — ABNORMAL HIGH (ref 0.0–0.2)

## 2017-11-05 LAB — GLUCOSE, CAPILLARY
Glucose-Capillary: 79 mg/dL (ref 70–99)
Glucose-Capillary: 127 mg/dL — ABNORMAL HIGH (ref 70–99)

## 2017-11-05 LAB — TYPE AND SCREEN
ABO/RH(D): A POS
ANTIBODY SCREEN: NEGATIVE

## 2017-11-05 MED ORDER — PHENYLEPHRINE 40 MCG/ML (10ML) SYRINGE FOR IV PUSH (FOR BLOOD PRESSURE SUPPORT)
PREFILLED_SYRINGE | INTRAVENOUS | Status: AC
  Filled 2017-11-05: qty 20

## 2017-11-05 MED ORDER — SOD CITRATE-CITRIC ACID 500-334 MG/5ML PO SOLN: 30.0000 mL | ORAL | Status: DC | PRN

## 2017-11-05 MED ORDER — ONDANSETRON HCL 4 MG/2ML IJ SOLN: 4.0000 mg | Freq: Four times a day (QID) | INTRAMUSCULAR | Status: DC | PRN

## 2017-11-05 MED ORDER — FENTANYL 2.5 MCG/ML BUPIVACAINE 1/10 % EPIDURAL INFUSION (WH - ANES)
INTRAMUSCULAR | Status: AC
  Filled 2017-11-05: qty 100

## 2017-11-05 MED ORDER — LACTATED RINGERS IV SOLN
INTRAVENOUS | Status: DC
  Administered 2017-11-05 – 2017-11-06 (×5): via INTRAVENOUS

## 2017-11-05 MED ORDER — LIDOCAINE HCL (PF) 1 % IJ SOLN
INTRAMUSCULAR | Status: DC | PRN
  Administered 2017-11-05: 10 mL via EPIDURAL

## 2017-11-05 MED ORDER — FENTANYL 2.5 MCG/ML BUPIVACAINE 1/10 % EPIDURAL INFUSION (WH - ANES)
14.0000 mL/h | INTRAMUSCULAR | Status: DC | PRN
  Administered 2017-11-05 – 2017-11-06 (×3): 14 mL/h via EPIDURAL
  Filled 2017-11-05 (×2): qty 100

## 2017-11-05 MED ORDER — PENICILLIN G 3 MILLION UNITS IVPB - SIMPLE MED
3.0000 10*6.[IU] | INTRAVENOUS | Status: DC
  Administered 2017-11-05 – 2017-11-06 (×9): 3 10*6.[IU] via INTRAVENOUS
  Filled 2017-11-05 (×12): qty 100

## 2017-11-05 MED ORDER — SODIUM CHLORIDE 0.9 % IV SOLN
5.0000 10*6.[IU] | Freq: Once | INTRAVENOUS | Status: AC
  Administered 2017-11-05: 5 10*6.[IU] via INTRAVENOUS
  Filled 2017-11-05: qty 5

## 2017-11-05 MED ORDER — OXYTOCIN 40 UNITS IN LACTATED RINGERS INFUSION - SIMPLE MED
1.0000 m[IU]/min | INTRAVENOUS | Status: DC
  Administered 2017-11-05: 2 m[IU]/min via INTRAVENOUS
  Filled 2017-11-05: qty 1000

## 2017-11-05 MED ORDER — OXYTOCIN 40 UNITS IN LACTATED RINGERS INFUSION - SIMPLE MED: 2.5000 [IU]/h | INTRAVENOUS | Status: DC

## 2017-11-05 MED ORDER — LACTATED RINGERS IV SOLN
500.0000 mL | INTRAVENOUS | Status: DC | PRN
  Administered 2017-11-06: 500 mL via INTRAVENOUS

## 2017-11-05 MED ORDER — EPHEDRINE 5 MG/ML INJ
10.0000 mg | INTRAVENOUS | Status: DC | PRN
  Filled 2017-11-05: qty 2

## 2017-11-05 MED ORDER — DIPHENHYDRAMINE HCL 50 MG/ML IJ SOLN
12.5000 mg | INTRAMUSCULAR | Status: AC | PRN
  Administered 2017-11-06 (×3): 12.5 mg via INTRAVENOUS
  Filled 2017-11-05: qty 1

## 2017-11-05 MED ORDER — OXYCODONE-ACETAMINOPHEN 5-325 MG PO TABS: 1.0000 | ORAL_TABLET | ORAL | Status: DC | PRN

## 2017-11-05 MED ORDER — PHENYLEPHRINE 40 MCG/ML (10ML) SYRINGE FOR IV PUSH (FOR BLOOD PRESSURE SUPPORT)
80.0000 ug | PREFILLED_SYRINGE | INTRAVENOUS | Status: DC | PRN
  Filled 2017-11-05: qty 5

## 2017-11-05 MED ORDER — TERBUTALINE SULFATE 1 MG/ML IJ SOLN
0.2500 mg | Freq: Once | INTRAMUSCULAR | Status: DC | PRN
  Filled 2017-11-05: qty 1

## 2017-11-05 MED ORDER — FENTANYL CITRATE (PF) 100 MCG/2ML IJ SOLN
50.0000 ug | INTRAMUSCULAR | Status: DC | PRN
  Administered 2017-11-05: 100 ug via INTRAVENOUS
  Administered 2017-11-05: 50 ug via INTRAVENOUS
  Filled 2017-11-05 (×3): qty 2

## 2017-11-05 MED ORDER — ACETAMINOPHEN 325 MG PO TABS: 650.0000 mg | ORAL_TABLET | ORAL | Status: DC | PRN

## 2017-11-05 MED ORDER — OXYCODONE-ACETAMINOPHEN 5-325 MG PO TABS: 2.0000 | ORAL_TABLET | ORAL | Status: DC | PRN

## 2017-11-05 MED ORDER — LACTATED RINGERS IV SOLN
500.0000 mL | Freq: Once | INTRAVENOUS | Status: AC
  Administered 2017-11-05: 500 mL via INTRAVENOUS

## 2017-11-05 MED ORDER — OXYTOCIN BOLUS FROM INFUSION
500.0000 mL | Freq: Once | INTRAVENOUS | Status: AC
  Administered 2017-11-06: 500 mL/h via INTRAVENOUS

## 2017-11-05 MED ORDER — MISOPROSTOL 25 MCG QUARTER TABLET
25.0000 ug | ORAL_TABLET | ORAL | Status: DC | PRN
  Administered 2017-11-05 (×2): 25 ug via VAGINAL
  Filled 2017-11-05 (×5): qty 1

## 2017-11-05 MED ORDER — LIDOCAINE HCL (PF) 1 % IJ SOLN
30.0000 mL | INTRAMUSCULAR | Status: DC | PRN
  Filled 2017-11-05: qty 30

## 2017-11-05 NOTE — Anesthesia Procedure Notes (Signed)
Epidural Patient location during procedure: OB Start time: 11/05/2017 10:51 PM End time: 11/05/2017 11:06 PM  Staffing Anesthesiologist: Lucretia Kern, MD Performed: anesthesiologist   Preanesthetic Checklist Completed: patient identified, pre-op evaluation, timeout performed, IV checked, risks and benefits discussed and monitors and equipment checked  Epidural Patient position: sitting Prep: DuraPrep Patient monitoring: heart rate, continuous pulse ox and blood pressure Approach: midline Location: L3-L4 Injection technique: LOR air  Needle:  Needle type: Tuohy  Needle gauge: 17 G Needle length: 9 cm Needle insertion depth: 6 cm Catheter type: closed end flexible Catheter size: 19 Gauge Catheter at skin depth: 11 cm  Assessment Events: blood not aspirated, injection not painful, no injection resistance, negative IV test and no paresthesia  Additional Notes Reason for block:procedure for pain

## 2017-11-05 NOTE — Anesthesia Pain Management Evaluation Note (Signed)
  CRNA Pain Management Visit Note  Patient: [name], 27 y.o., female  "Hello I am a member of the anesthesia team at [facility]. We have an anesthesia team available at all times to provide care throughout the hospital, including epidural management and anesthesia for C-section. I don't know your plan for the delivery whether it a natural birth, water birth, IV sedation, nitrous supplementation, doula or epidural, but we want to meet your pain goals."   1.Was your pain managed to your expectations on prior hospitalizations?   No prior hospitalizations  2.What is your expectation for pain management during this hospitalization?     IV pain meds  3.How can we help you reach that goal?   Record the patient's initial score and the patient's pain goal.   Pain: 0  Pain Goal: 7 [facility] wants you to be able to say your pain was always managed very well.  Laban Emperor 11/05/2017
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
## 2017-11-06 ENCOUNTER — Encounter (HOSPITAL_COMMUNITY): Payer: Self-pay

## 2017-11-06 DIAGNOSIS — O24420 Gestational diabetes mellitus in childbirth, diet controlled: Secondary | ICD-10-CM

## 2017-11-06 DIAGNOSIS — Z3A40 40 weeks gestation of pregnancy: Secondary | ICD-10-CM

## 2017-11-06 DIAGNOSIS — O480 Post-term pregnancy: Secondary | ICD-10-CM

## 2017-11-06 DIAGNOSIS — O99824 Streptococcus B carrier state complicating childbirth: Secondary | ICD-10-CM

## 2017-11-06 LAB — GLUCOSE, CAPILLARY
Glucose-Capillary: 106 mg/dL — ABNORMAL HIGH (ref 70–99)
Glucose-Capillary: 71 mg/dL (ref 70–99)
Glucose-Capillary: 74 mg/dL (ref 70–99)
Glucose-Capillary: 90 mg/dL (ref 70–99)
Glucose-Capillary: 92 mg/dL (ref 70–99)

## 2017-11-06 LAB — RPR: RPR Ser Ql: NONREACTIVE

## 2017-11-06 MED ORDER — NALBUPHINE HCL 10 MG/ML IJ SOLN
5.0000 mg | Freq: Once | INTRAMUSCULAR | Status: AC
  Administered 2017-11-06: 5 mg via INTRAVENOUS
  Filled 2017-11-06: qty 1

## 2017-11-06 NOTE — Progress Notes (Signed)
Patient ID: [name], female   DOB: 04-19-1990, 27 y.o.   MRN: [id number] Late entry, patient seen at 0900  AVSS  FHR reassuring UCs every 5-6 min  Cervix is actually 5cm and not 7cm Internal os 5, ext os 7 Vertex high at -3 station  AROM lt mec fluid IUPC inserted
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
## 2017-11-06 NOTE — Progress Notes (Signed)
Patient ID: [name], female   DOB: 1990-04-22, 27 y.o.   MRN: [id number] Comfortable  Vitals:   11/06/17 1401 11/06/17 1431 11/06/17 1501 11/06/17 1524  BP: 114/67 96/82 (!) 101/54 115/62  Pulse: 93 99 97 86  Resp: 18 18 18 18   Temp: 99.6 F (37.6 C)     TempSrc: Axillary     Weight:      Height:        FHR stable UCs measuring 150-170 MVU/72min  Dilation: 6.5 Effacement (%): 80, 90 Cervical Position: Middle Station: -2 Presentation: Vertex Exam by:: Foye Clock RN   Discussed with DR Vergie Living Will keep increasing Pitocin until more adequate Then recheck at 1800hr and reconsult

## 2017-11-07 MED ORDER — ACETAMINOPHEN 325 MG PO TABS
650.0000 mg | ORAL_TABLET | ORAL | Status: DC | PRN
  Filled 2017-11-07: qty 2

## 2017-11-07 MED ORDER — ONDANSETRON HCL 4 MG/2ML IJ SOLN: 4.0000 mg | INTRAMUSCULAR | Status: DC | PRN

## 2017-11-07 MED ORDER — BENZOCAINE-MENTHOL 20-0.5 % EX AERO: 1.0000 "application " | INHALATION_SPRAY | CUTANEOUS | Status: DC | PRN

## 2017-11-07 MED ORDER — ZOLPIDEM TARTRATE 5 MG PO TABS: 5.0000 mg | ORAL_TABLET | Freq: Every evening | ORAL | Status: DC | PRN

## 2017-11-07 MED ORDER — PRENATAL MULTIVITAMIN CH
1.0000 | ORAL_TABLET | Freq: Every day | ORAL | Status: DC
  Administered 2017-11-07 – 2017-11-08 (×2): 1 via ORAL
  Filled 2017-11-07 (×2): qty 1

## 2017-11-07 MED ORDER — SENNOSIDES-DOCUSATE SODIUM 8.6-50 MG PO TABS
2.0000 | ORAL_TABLET | ORAL | Status: DC
  Administered 2017-11-07 (×2): 2 via ORAL
  Filled 2017-11-07 (×2): qty 2

## 2017-11-07 MED ORDER — ONDANSETRON HCL 4 MG PO TABS: 4.0000 mg | ORAL_TABLET | ORAL | Status: DC | PRN

## 2017-11-07 MED ORDER — SIMETHICONE 80 MG PO CHEW: 80.0000 mg | CHEWABLE_TABLET | ORAL | Status: DC | PRN

## 2017-11-07 MED ORDER — TETANUS-DIPHTH-ACELL PERTUSSIS 5-2.5-18.5 LF-MCG/0.5 IM SUSP: 0.5000 mL | Freq: Once | INTRAMUSCULAR | Status: DC

## 2017-11-07 MED ORDER — DIPHENHYDRAMINE HCL 25 MG PO CAPS: 25.0000 mg | ORAL_CAPSULE | Freq: Four times a day (QID) | ORAL | Status: DC | PRN

## 2017-11-07 MED ORDER — WITCH HAZEL-GLYCERIN EX PADS: 1.0000 "application " | MEDICATED_PAD | CUTANEOUS | Status: DC | PRN

## 2017-11-07 MED ORDER — IBUPROFEN 600 MG PO TABS
600.0000 mg | ORAL_TABLET | Freq: Four times a day (QID) | ORAL | Status: DC
  Administered 2017-11-07 – 2017-11-08 (×6): 600 mg via ORAL
  Filled 2017-11-07 (×7): qty 1

## 2017-11-07 MED ORDER — DIBUCAINE 1 % RE OINT: 1.0000 "application " | TOPICAL_OINTMENT | RECTAL | Status: DC | PRN

## 2017-11-07 MED ORDER — COCONUT OIL OIL: 1.0000 "application " | TOPICAL_OIL | Status: DC | PRN

## 2017-11-07 NOTE — Progress Notes (Signed)
POSTPARTUM PROGRESS NOTE  Post Partum Day 1  Subjective:  [name] is a 27 y.o. G1P1001 s/p SVD after IOL for A1GDM at [redacted]w[redacted]d.  She reports she is doing well. No acute events overnight. She denies any problems with ambulating, voiding or po intake. Denies nausea or vomiting.  Pain is well controlled.  Lochia is mild.  Objective: Blood pressure 110/67, pulse 86, temperature 98.1 F (36.7 C), temperature source Oral, resp. rate 18, height 5\' 11"  (1.803 m), weight 104.1 kg, last menstrual period 02/02/2017, SpO2 99 %, unknown if currently breastfeeding.  Physical Exam:  General: alert, cooperative and no distress Chest: no respiratory distress Heart:regular rate  Abdomen: soft, nontender,  Uterine Fundus: firm, appropriately tender DVT Evaluation: No calf swelling or tenderness Extremities: no edema Skin: warm, dry  Recent Labs    11/05/17 0759  HGB 10.2*  HCT 28.4*    Assessment/Plan: [name] is a 27 y.o. G1P1001 s/p SVD at [redacted]w[redacted]d   PPD#1 - Doing well  Routine postpartum care Contraception: abstinence  Feeding: both breast and bottle  Dispo: Plan for discharge PPD#2.   LOS: 2 days   Oralia Manis, DO PGY-2 11/07/2017, 8:54 AM

## 2017-11-07 NOTE — Anesthesia Postprocedure Evaluation (Signed)
Anesthesia Post Note  Patient: [name]  Procedure(s) Performed: AN AD HOC LABOR EPIDURAL     Anesthesia Type: Epidural Pain management: pain level controlled Vital Signs Assessment: post-procedure vital signs reviewed and stable Respiratory status: spontaneous breathing Cardiovascular status: stable Postop Assessment: patient able to bend at knees, epidural receding, no backache and no headache Anesthetic complications: no    Last Vitals:  Vitals:   11/07/17 0135 11/07/17 0501  BP: 109/71 110/67  Pulse: 87 86  Resp: 18 18  Temp: 36.9 C 36.7 C  SpO2: 99% 99%    Last Pain:  Vitals:   11/07/17 0720  TempSrc:   PainSc: 0-No pain   Pain Goal:                 Edison Pace
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
## 2017-11-07 NOTE — Lactation Note (Signed)
This note was copied from a baby's chart. Lactation Consultation Note Baby 10 hrs old. Mom having difficulty latching. Mom has semi flat very short shaft, soft compressible nipples. Nipples evert a little w/stimulation. Shells and hand pump given. Mom trying to latch in cradle. Suggested football to obtain deep latch.  Mom had no eye contact w/LC, very little verbal communication w/LC. LC asked mom if LC can touch breast to demonstrate hand expression. Mom didn't answer, LC asked mom again, mom stated yes.  Mom does have adjustment disorder and PTSD. Mom appears to have communication issues. Not aggressive w/BF or following suggestions.  Mom appeared to be stand offish, irritated, acting as if not really wanting to BF.  Baby is able to latch if mom would put baby to breast closer and support breast.mom would slightly shake her head and say no. LC would get baby closer to get a deeper latch and do chin tug.  Mom will not. LC demonstrated, several times, coaching mom.  Mom took baby off of breast, stated I'm going to burp baby. Mom asked for formula again per RN. Discussed supplementing. LC questions if mom  really wants to BF.  Newborn behavior, STS, I&O, supply and demand discussed. Staff cont. To offer assistance . Mom encouraged to feed baby 8-12 times/24 hours and with feeding cues.  [facility]/LC brochure given w/resources, support groups and LC services.  Patient Name: [name] Today's Date: 11/07/2017 Reason for consult: Initial assessment;1st time breastfeeding   Maternal Data Has patient been taught Hand Expression?: Yes Does the patient have breastfeeding experience prior to this delivery?: No  Feeding Feeding Type: Breast Fed  LATCH Score Latch: Too sleepy or reluctant, no latch achieved, no sucking elicited.  Audible Swallowing: None  Type of Nipple: Flat  Comfort (Breast/Nipple): Soft / non-tender  Hold (Positioning): Full assist, staff holds infant at breast  LATCH  Score: 3  Interventions Interventions: Breast feeding basics reviewed;Support pillows;Assisted with latch;Position options;Skin to skin;Breast massage;Hand express;Shells;Pre-pump if needed;Hand pump;Breast compression;Adjust position  Lactation Tools Discussed/Used Tools: Pump;Shells Shell Type: Inverted Breast pump type: Manual WIC Program: Yes Pump Review: Setup, frequency, and cleaning;Milk Storage Initiated by:: RN Date initiated:: 11/07/17   Consult Status Consult Status: Follow-up Date: 11/07/17 Follow-up type: In-patient    Charyl Dancer 11/07/2017, 6:52 AM

## 2017-11-08 MED ORDER — IBUPROFEN 600 MG PO TABS: 600.0000 mg | ORAL_TABLET | Freq: Four times a day (QID) | ORAL | 1 refills | Status: DC

## 2017-11-08 MED ORDER — SENNOSIDES-DOCUSATE SODIUM 8.6-50 MG PO TABS: 2.0000 | ORAL_TABLET | ORAL | 0 refills | Status: DC
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
## 2017-11-08 NOTE — Discharge Instructions (Signed)
Postpartum Care After Vaginal Delivery °The period of time right after you deliver your newborn is called the postpartum period. °What kind of medical care will I receive? °· You may continue to receive fluids and medicines through an IV tube inserted into one of your veins. °· If an incision was made near your vagina (episiotomy) or if you had some vaginal tearing during delivery, cold compresses may be placed on your episiotomy or your tear. This helps to reduce pain and swelling. °· You may be given a squirt bottle to use when you go to the bathroom. You may use this until you are comfortable wiping as usual. To use the squirt bottle, follow these steps: °? Before you urinate, fill the squirt bottle with warm water. Do not use hot water. °? After you urinate, while you are sitting on the toilet, use the squirt bottle to rinse the area around your urethra and vaginal opening. This rinses away any urine and blood. °? You may do this instead of wiping. As you start healing, you may use the squirt bottle before wiping yourself. Make sure to wipe gently. °? Fill the squirt bottle with clean water every time you use the bathroom. °· You will be given sanitary pads to wear. °How can I expect to feel? °· You may not feel the need to urinate for several hours after delivery. °· You will have some soreness and pain in your abdomen and vagina. °· If you are breastfeeding, you may have uterine contractions every time you breastfeed for up to several weeks postpartum. Uterine contractions help your uterus return to its normal size. °· It is normal to have vaginal bleeding (lochia) after delivery. The amount and appearance of lochia is often similar to a menstrual period in the first week after delivery. It will gradually decrease over the next few weeks to a dry, yellow-brown discharge. For most women, lochia stops completely by 6-8 weeks after delivery. Vaginal bleeding can vary from woman to woman. °· Within the first few  days after delivery, you may have breast engorgement. This is when your breasts feel heavy, full, and uncomfortable. Your breasts may also throb and feel hard, tightly stretched, warm, and tender. After this occurs, you may have milk leaking from your breasts. Your health care provider can help you relieve discomfort due to breast engorgement. Breast engorgement should go away within a few days. °· You may feel more sad or worried than normal due to hormonal changes after delivery. These feelings should not last more than a few days. If these feelings do not go away after several days, speak with your health care provider. °How should I care for myself? °· Tell your health care provider if you have pain or discomfort. °· Drink enough water to keep your urine clear or pale yellow. °· Wash your hands thoroughly with soap and water for at least 20 seconds after changing your sanitary pads, after using the toilet, and before holding or feeding your baby. °· If you are not breastfeeding, avoid touching your breasts a lot. Doing this can make your breasts produce more milk. °· If you become weak or lightheaded, or you feel like you might faint, ask for help before: °? Getting out of bed. °? Showering. °· Change your sanitary pads frequently. Watch for any changes in your flow, such as a sudden increase in volume, a change in color, the passing of large blood clots. If you pass a blood clot from your vagina, save it   to show to your health care provider. Do not flush blood clots down the toilet without having your health care provider look at them. °· Make sure that all your vaccinations are up to date. This can help protect you and your baby from getting certain diseases. You may need to have immunizations done before you leave the hospital. °· If desired, talk with your health care provider about methods of family planning or birth control (contraception). °How can I start bonding with my baby? °Spending as much time as  possible with your baby is very important. During this time, you and your baby can get to know each other and develop a bond. Having your baby stay with you in your room (rooming in) can give you time to get to know your baby. Rooming in can also help you become comfortable caring for your baby. Breastfeeding can also help you bond with your baby. °How can I plan for returning home with my baby? °· Make sure that you have a car seat installed in your vehicle. °? Your car seat should be checked by a certified car seat installer to make sure that it is installed safely. °? Make sure that your baby fits into the car seat safely. °· Ask your health care provider any questions you have about caring for yourself or your baby. Make sure that you are able to contact your health care provider with any questions after leaving the hospital. °This information is not intended to replace advice given to you by your health care provider. Make sure you discuss any questions you have with your health care provider. °Document Released: 11/07/2006 Document Revised: 06/15/2015 Document Reviewed: 12/15/2014 °Elsevier Interactive Patient Education © 2018 [employer] ° °

## 2017-11-28 ENCOUNTER — Encounter: Payer: Self-pay | Admitting: *Deleted

## 2017-12-14 DIAGNOSIS — D571 Sickle-cell disease without crisis: Secondary | ICD-10-CM | POA: Diagnosis not present

## 2017-12-18 ENCOUNTER — Ambulatory Visit (INDEPENDENT_AMBULATORY_CARE_PROVIDER_SITE_OTHER): Payer: Medicaid Other | Admitting: Obstetrics and Gynecology

## 2017-12-18 ENCOUNTER — Encounter: Payer: Self-pay | Admitting: Obstetrics and Gynecology

## 2017-12-18 ENCOUNTER — Other Ambulatory Visit: Payer: Medicaid Other

## 2017-12-18 VITALS — BP 122/77 | HR 67 | Wt 211.0 lb

## 2017-12-18 DIAGNOSIS — Z1389 Encounter for screening for other disorder: Secondary | ICD-10-CM | POA: Diagnosis not present

## 2017-12-18 DIAGNOSIS — O99810 Abnormal glucose complicating pregnancy: Secondary | ICD-10-CM

## 2017-12-18 NOTE — Progress Notes (Signed)
Post Partum Exam  [name] is a 27 y.o. 321P1001 female who presents for a postpartum visit. She is 6 weeks postpartum following a spontaneous vaginal delivery. I have fully reviewed the prenatal and intrapartum course. The delivery was at 40.1 gestational weeks.  Anesthesia: epidural. Postpartum course has been uncomplicated. Baby's course has been uncomplicated. Baby is feeding by both breast and bottle - Daron OfferGerber Goodstart. Bleeding no bleeding. Bowel function is normal. Bladder function is normal. Patient is not sexually active. Contraception method is abstinence.  Postpartum depression screening:neg, score 0.    Last pap smear done 04/2017 and was Abnormal- LGSIL with +HPV  Review of Systems Pertinent items are noted in HPI.    Objective:  Blood pressure 122/77, pulse 67, weight 211 lb (95.7 kg), unknown if currently breastfeeding.  General:  alert, cooperative and no distress   Breasts:  inspection negative, no nipple discharge or bleeding, no masses or nodularity palpable  Lungs: clear to auscultation bilaterally  Heart:  regular rate and rhythm  Abdomen: soft, non-tender; bowel sounds normal; no masses,  no organomegaly   Vulva:  normal  Vagina: normal vagina, no discharge, exudate, lesion, or erythema  Cervix:  multiparous appearance  Corpus: normal size, contour, position, consistency, mobility, non-tender  Adnexa:  normal adnexa and no mass, fullness, tenderness  Rectal Exam: Not performed.        Assessment:    Normal postpartum exam. Pap smear not done at today's visit.   Plan:   1. Contraception: condoms 2. Patient is medically cleared to resume all activities of daily living 3. Follow up in: 2 weeks for colposcopyor as needed.

## 2017-12-19 LAB — GLUCOSE TOLERANCE, 2 HOURS
GLUCOSE FASTING GTT: 83 mg/dL (ref 65–99)
GLUCOSE, 2 HOUR: 86 mg/dL (ref 65–139)

## 2018-01-02 ENCOUNTER — Encounter: Payer: Self-pay | Admitting: Obstetrics and Gynecology

## 2018-01-29 ENCOUNTER — Encounter: Payer: Self-pay | Admitting: Obstetrics and Gynecology

## 2018-01-29 ENCOUNTER — Other Ambulatory Visit (HOSPITAL_COMMUNITY)
Admission: RE | Admit: 2018-01-29 | Discharge: 2018-01-29 | Disposition: A | Discharge status: Home / Self Care | Payer: Medicaid Other | Source: Ambulatory Visit | Attending: Obstetrics and Gynecology | Admitting: Obstetrics and Gynecology

## 2018-01-29 ENCOUNTER — Ambulatory Visit (INDEPENDENT_AMBULATORY_CARE_PROVIDER_SITE_OTHER): Payer: Medicaid Other | Admitting: Obstetrics and Gynecology

## 2018-01-29 VITALS — BP 115/73 | HR 89 | Wt 209.9 lb

## 2018-01-29 DIAGNOSIS — Z3042 Encounter for surveillance of injectable contraceptive: Secondary | ICD-10-CM

## 2018-01-29 DIAGNOSIS — R87612 Low grade squamous intraepithelial lesion on cytologic smear of cervix (LGSIL): Secondary | ICD-10-CM | POA: Diagnosis not present

## 2018-01-29 DIAGNOSIS — N870 Mild cervical dysplasia: Secondary | ICD-10-CM

## 2018-01-29 DIAGNOSIS — Z23 Encounter for immunization: Secondary | ICD-10-CM

## 2018-01-29 DIAGNOSIS — Z3202 Encounter for pregnancy test, result negative: Secondary | ICD-10-CM | POA: Diagnosis not present

## 2018-01-29 LAB — POCT URINE PREGNANCY: Preg Test, Ur: NEGATIVE

## 2018-01-29 MED ORDER — MEDROXYPROGESTERONE ACETATE 150 MG/ML IM SUSP
150.0000 mg | INTRAMUSCULAR | Status: DC
  Administered 2018-01-29 – 2019-08-13 (×3): 150 mg via INTRAMUSCULAR

## 2018-01-29 MED ORDER — MEDROXYPROGESTERONE ACETATE 150 MG/ML IM SUSP: 150.0000 mg | INTRAMUSCULAR | 4 refills | Status: DC

## 2018-01-29 NOTE — Addendum Note (Signed)
Addended by: Natale Milch D on: 01/29/2018 04:11 PM   Modules accepted: Orders

## 2018-01-29 NOTE — Progress Notes (Signed)
28 yo with LGSIL on 04/2017 pap smear here for colposcopy  Patient given informed consent, signed copy in the chart, time out was performed.  Placed in lithotomy position. Cervix viewed with speculum and colposcope after application of acetic acid.   Colposcopy adequate?  yes Acetowhite lesions? 6 o'clock Punctation? no Mosaicism?  no Abnormal vasculature?  no Biopsies? Yes 6 o'clock ECC? yes  COMMENTS:  Patient was given post procedure instructions.  She will return in 2 weeks for results. Patient desires to start gardasil series and desires depo-provera for contraception  Catalina Antigua, MD

## 2018-01-29 NOTE — Progress Notes (Addendum)
Patient is in the office for colpo. Pt is considering depo, LMP 01-08-18.  Administered depo RUOQ, and pt tolerated well, next depo due Mar 24- Apr 7. .. Administrations This Visit    medroxyPROGESTERone (DEPO-PROVERA) injection 150 mg    Admin Date 01/29/2018 Action Given Dose 150 mg Route Intramuscular Administered By Katrina StackStalling, Talmadge Ganas D, RN

## 2018-10-26 ENCOUNTER — Ambulatory Visit (INDEPENDENT_AMBULATORY_CARE_PROVIDER_SITE_OTHER): Payer: Medicaid Other

## 2018-10-26 ENCOUNTER — Other Ambulatory Visit: Payer: Self-pay

## 2018-10-26 VITALS — BP 113/74 | HR 88 | Ht 71.0 in | Wt 225.0 lb

## 2018-10-26 DIAGNOSIS — Z3201 Encounter for pregnancy test, result positive: Secondary | ICD-10-CM

## 2018-10-26 LAB — POCT URINE PREGNANCY: Preg Test, Ur: POSITIVE — AB

## 2018-10-26 NOTE — Progress Notes (Signed)
[name] presents today for UPT. She has no unusual complaints.  LMP:09/06/2018   7w 1d EDD 06/13/2019    OBJECTIVE: Appears well, in no apparent distress.  OB History    Gravida  2   Para  1   Term  1   Preterm  0   AB  0   Living  1     SAB  0   TAB  0   Ectopic  0   Multiple  0   Live Births  1          Home UPT Result: POSITIVE x 4 In-Office UPT result: POSITIVE  I have reviewed the patient's medical, obstetrical, social, and family histories, and medications.   ASSESSMENT: Positive pregnancy test  PLAN Prenatal care to be completed at: Patient does not to be pregnant, she will call us.

## 2018-11-06 ENCOUNTER — Other Ambulatory Visit: Payer: Self-pay

## 2018-11-06 ENCOUNTER — Emergency Department (HOSPITAL_COMMUNITY)
Admission: EM | Admit: 2018-11-06 | Discharge: 2018-11-06 | Disposition: A | Discharge status: Home / Self Care | Payer: Medicaid Other | Attending: Emergency Medicine | Admitting: Emergency Medicine

## 2018-11-06 ENCOUNTER — Emergency Department (HOSPITAL_COMMUNITY): Payer: Medicaid Other

## 2018-11-06 ENCOUNTER — Encounter (HOSPITAL_COMMUNITY): Payer: Self-pay

## 2018-11-06 DIAGNOSIS — S99912A Unspecified injury of left ankle, initial encounter: Secondary | ICD-10-CM | POA: Diagnosis not present

## 2018-11-06 DIAGNOSIS — S92102A Unspecified fracture of left talus, initial encounter for closed fracture: Secondary | ICD-10-CM | POA: Diagnosis not present

## 2018-11-06 DIAGNOSIS — S82045A Nondisplaced comminuted fracture of left patella, initial encounter for closed fracture: Secondary | ICD-10-CM | POA: Diagnosis not present

## 2018-11-06 DIAGNOSIS — F17210 Nicotine dependence, cigarettes, uncomplicated: Secondary | ICD-10-CM | POA: Diagnosis not present

## 2018-11-06 DIAGNOSIS — Y9289 Other specified places as the place of occurrence of the external cause: Secondary | ICD-10-CM | POA: Diagnosis not present

## 2018-11-06 DIAGNOSIS — W010XXA Fall on same level from slipping, tripping and stumbling without subsequent striking against object, initial encounter: Secondary | ICD-10-CM | POA: Insufficient documentation

## 2018-11-06 DIAGNOSIS — S82002A Unspecified fracture of left patella, initial encounter for closed fracture: Secondary | ICD-10-CM

## 2018-11-06 DIAGNOSIS — Y999 Unspecified external cause status: Secondary | ICD-10-CM | POA: Insufficient documentation

## 2018-11-06 DIAGNOSIS — W19XXXA Unspecified fall, initial encounter: Secondary | ICD-10-CM

## 2018-11-06 DIAGNOSIS — S8992XA Unspecified injury of left lower leg, initial encounter: Secondary | ICD-10-CM | POA: Diagnosis present

## 2018-11-06 DIAGNOSIS — M25572 Pain in left ankle and joints of left foot: Secondary | ICD-10-CM | POA: Diagnosis not present

## 2018-11-06 DIAGNOSIS — Y9389 Activity, other specified: Secondary | ICD-10-CM | POA: Insufficient documentation

## 2018-11-06 MED ORDER — HYDROCODONE-ACETAMINOPHEN 5-325 MG PO TABS: 1.0000 | ORAL_TABLET | ORAL | 0 refills | Status: DC | PRN
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
## 2018-11-06 NOTE — ED Triage Notes (Signed)
Pt states she fell yesterday in the mud. Pt states that her knee popped and needed assistance getting up. Pt c/o pain from left knee to ankle and back.

## 2018-11-06 NOTE — ED Notes (Signed)
Called x 1 for rooming

## 2018-11-06 NOTE — Discharge Instructions (Addendum)
Take Norco as needed as prescribed for pain do not take if driving or operating machinery. Take Colace as needed for constipation caused by Norco. Elevate the leg and apply ice for 30 minutes at a time to the knee and ankle. Do not bear weight until cleared by orthopedics. Follow-up with your orthopedist, call tomorrow to schedule an appointment.

## 2018-12-07 ENCOUNTER — Ambulatory Visit: Payer: Medicaid Other | Admitting: Women's Health

## 2018-12-07 ENCOUNTER — Encounter: Payer: Self-pay | Admitting: Women's Health

## 2018-12-07 ENCOUNTER — Other Ambulatory Visit: Payer: Self-pay

## 2018-12-07 VITALS — BP 120/77 | HR 71 | Ht 71.0 in | Wt 225.0 lb

## 2018-12-07 DIAGNOSIS — Z30013 Encounter for initial prescription of injectable contraceptive: Secondary | ICD-10-CM

## 2018-12-07 MED ORDER — MEDROXYPROGESTERONE ACETATE 150 MG/ML IM SUSP
150.0000 mg | Freq: Once | INTRAMUSCULAR | Status: AC
  Administered 2018-12-07: 150 mg via INTRAMUSCULAR

## 2018-12-07 MED ORDER — MEDROXYPROGESTERONE ACETATE 150 MG/ML IM SUSP: 150.0000 mg | INTRAMUSCULAR | 3 refills | Status: DC

## 2018-12-07 NOTE — Progress Notes (Signed)
History:  [name] is a 28 y.o. female G2P1001 here for contraception w/ no c/o. PMH unremarkable. NKDA. No hx of HTN, DM, asthma, blood clots, migraines, liver or heart problems. LMP 12/07/2018, pt sexually active. Sex since LMP, condom use 100%. UPT today neg. Reviewed CDC birth control chart. Pt has used Depo in the past w/ success - desires this method again. Pt declines flu vaccine today. UTD Pap. Discussed administration, side effects, warning signs, time to effectiveness, back-up method PRN, protecting against STDs. RTO 4-6wks for f/u.  Pt denies any health problems, but problem list shows sickle cell and mood disorder.  Pt had LSIL Pap with +HPV 04/2017, but declines Pap today d/t start of menses.  The following portions of the patient's history were reviewed and updated as appropriate: allergies, current medications, family history, past medical history, social history, past surgical history and problem list.  Review of Systems:  Review of Systems  Constitutional: Negative for chills and fever.  Respiratory: Negative for shortness of breath.   Cardiovascular: Negative for chest pain.  Gastrointestinal: Negative for abdominal pain, nausea and vomiting.  Genitourinary: Negative for dysuria and urgency.  Neurological: Negative for headaches.     Objective:  Physical Exam BP 120/77   Pulse 71   Ht 5\' 11"  (1.803 m)   Wt 225 lb (102.1 kg)   LMP 11/10/2018   BMI 31.38 kg/m  Physical Exam  Constitutional: She is oriented to person, place, and time. She appears well-developed and well-nourished. No distress.  HENT:  Head: Normocephalic and atraumatic.  Respiratory: Effort normal.  Neurological: She is alert and oriented to person, place, and time.  Skin: She is not diaphoretic.  Psychiatric: She has a normal mood and affect. Her behavior is normal. Judgment and thought content normal.   Labs and Imaging No results found for this or any previous visit (from the past 24  hour(s)).  No results found.   Assessment & Plan:   1. Encounter for prescription for depo-Provera - counseling per above and per after visit instructions - POCT urine pregnancy - negative - medroxyPROGESTERone (DEPO-PROVERA) injection 150 mg  Diesha Rostad, Gerrie Nordmann, NP 12/07/2018 12:15 PM

## 2018-12-07 NOTE — Progress Notes (Addendum)
GYN presents for [facility] Consult, she wants DEPO. She had unprotected sex 1 week ago.  UPT today is NEGATIVE. Office supplied DEPO given in [place], tolerated well. Next DEPO Jan. 29 - Feb. 12, 2020

## 2018-12-07 NOTE — Patient Instructions (Signed)
You have received a prescription for the Depo-Provera injection for birth control today. You are not fully protected from pregnancy until 7 days after your injection. If you have sex in the next week, use a backup method to prevent pregnancy, such as a condom. Return in 3 months for your next shot - refer to handout provided for dates. The Depo-Provera injection does not protect from sexually transmitted infections (STIs). Use condoms with each new or untested partner.  Take calcium and vitamin D (either through diet or by added calcium and vitamin D supplements). Get regular weight-bearing exercises like walking, running, and weight-lifting.   Medroxyprogesterone injection [Contraceptive] What is this medicine? MEDROXYPROGESTERONE (me DROX ee proe JES te rone) contraceptive injections prevent pregnancy. They provide effective birth control for 3 months. Depo-subQ Provera 104 is also used for treating pain related to endometriosis. This medicine may be used for other purposes; ask your health care provider or pharmacist if you have questions. COMMON BRAND NAME(S): Depo-Provera, Depo-subQ Provera 104 What should I tell my health care provider before I take this medicine? They need to know if you have any of these conditions:  frequently drink alcohol  asthma  blood vessel disease or a history of a blood clot in the lungs or legs  bone disease such as osteoporosis  breast cancer  diabetes  eating disorder (anorexia nervosa or bulimia)  high blood pressure  HIV infection or AIDS  kidney disease  liver disease  mental depression  migraine  seizures (convulsions)  stroke  tobacco smoker  vaginal bleeding  an unusual or allergic reaction to medroxyprogesterone, other hormones, medicines, foods, dyes, or preservatives  pregnant or trying to get pregnant  breast-feeding How should I use this medicine? Depo-Provera Contraceptive injection is given into a muscle. Depo-subQ  Provera 104 injection is given under the skin. These injections are given by a health care professional. You must not be pregnant before getting an injection. The injection is usually given during the first 5 days after the start of a menstrual period or 6 weeks after delivery of a baby. Talk to your pediatrician regarding the use of this medicine in children. Special care may be needed. These injections have been used in female children who have started having menstrual periods. Overdosage: If you think you have taken too much of this medicine contact a poison control center or emergency room at once. NOTE: This medicine is only for you. Do not share this medicine with others. What if I miss a dose? Try not to miss a dose. You must get an injection once every 3 months to maintain birth control. If you cannot keep an appointment, call and reschedule it. If you wait longer than 13 weeks between Depo-Provera contraceptive injections or longer than 14 weeks between Depo-subQ Provera 104 injections, you could get pregnant. Use another method for birth control if you miss your appointment. You may also need a pregnancy test before receiving another injection. What may interact with this medicine? Do not take this medicine with any of the following medications:  bosentan This medicine may also interact with the following medications:  aminoglutethimide  antibiotics or medicines for infections, especially rifampin, rifabutin, rifapentine, and griseofulvin  aprepitant  barbiturate medicines such as phenobarbital or primidone  bexarotene  carbamazepine  medicines for seizures like ethotoin, felbamate, oxcarbazepine, phenytoin, topiramate  modafinil  St. John's wort This list may not describe all possible interactions. Give your health care provider a list of all the medicines, herbs, non-prescription  drugs, or dietary supplements you use. Also tell them if you smoke, drink alcohol, or use illegal  drugs. Some items may interact with your medicine. What should I watch for while using this medicine? This drug does not protect you against HIV infection (AIDS) or other sexually transmitted diseases. Use of this product may cause you to lose calcium from your bones. Loss of calcium may cause weak bones (osteoporosis). Only use this product for more than 2 years if other forms of birth control are not right for you. The longer you use this product for birth control the more likely you will be at risk for weak bones. Ask your health care professional how you can keep strong bones. You may have a change in bleeding pattern or irregular periods. Many females stop having periods while taking this drug. If you have received your injections on time, your chance of being pregnant is very low. If you think you may be pregnant, see your health care professional as soon as possible. Tell your health care professional if you want to get pregnant within the next year. The effect of this medicine may last a long time after you get your last injection. What side effects may I notice from receiving this medicine? Side effects that you should report to your doctor or health care professional as soon as possible:  allergic reactions like skin rash, itching or hives, swelling of the face, lips, or tongue  breast tenderness or discharge  breathing problems  changes in vision  depression  feeling faint or lightheaded, falls  fever  pain in the abdomen, chest, groin, or leg  problems with balance, talking, walking  unusually weak or tired  yellowing of the eyes or skin Side effects that usually do not require medical attention (report to your doctor or health care professional if they continue or are bothersome):  acne  fluid retention and swelling  headache  irregular periods, spotting, or absent periods  temporary pain, itching, or skin reaction at site where injected  weight gain This list  may not describe all possible side effects. Call your doctor for medical advice about side effects. You may report side effects to FDA at 1-800-FDA-1088. Where should I keep my medicine? This does not apply. The injection will be given to you by a health care professional. NOTE: This sheet is a summary. It may not cover all possible information. If you have questions about this medicine, talk to your doctor, pharmacist, or health care provider.  2020 Elsevier/Gold Standard (2008-02-01 18:37:56)

## 2018-12-26 ENCOUNTER — Ambulatory Visit: Payer: Medicaid Other | Admitting: Nurse Practitioner

## 2019-01-16 ENCOUNTER — Other Ambulatory Visit: Payer: Self-pay

## 2019-01-16 ENCOUNTER — Other Ambulatory Visit (HOSPITAL_COMMUNITY)
Admission: RE | Admit: 2019-01-16 | Discharge: 2019-01-16 | Disposition: A | Discharge status: Home / Self Care | Payer: Medicaid Other | Source: Ambulatory Visit | Attending: Obstetrics and Gynecology | Admitting: Obstetrics and Gynecology

## 2019-01-16 ENCOUNTER — Encounter: Payer: Self-pay | Admitting: Obstetrics and Gynecology

## 2019-01-16 ENCOUNTER — Ambulatory Visit: Payer: Medicaid Other | Admitting: Obstetrics and Gynecology

## 2019-01-16 VITALS — BP 108/82 | Temp 98.5°F | Ht 71.0 in | Wt 223.5 lb

## 2019-01-16 DIAGNOSIS — N63 Unspecified lump in unspecified breast: Secondary | ICD-10-CM

## 2019-01-16 DIAGNOSIS — Z124 Encounter for screening for malignant neoplasm of cervix: Secondary | ICD-10-CM

## 2019-01-16 DIAGNOSIS — Z113 Encounter for screening for infections with a predominantly sexual mode of transmission: Secondary | ICD-10-CM | POA: Diagnosis not present

## 2019-01-16 DIAGNOSIS — N870 Mild cervical dysplasia: Secondary | ICD-10-CM

## 2019-01-16 DIAGNOSIS — N6452 Nipple discharge: Secondary | ICD-10-CM

## 2019-01-16 DIAGNOSIS — Z23 Encounter for immunization: Secondary | ICD-10-CM | POA: Diagnosis not present

## 2019-01-16 DIAGNOSIS — Z0000 Encounter for general adult medical examination without abnormal findings: Secondary | ICD-10-CM | POA: Diagnosis not present

## 2019-01-16 DIAGNOSIS — Z01419 Encounter for gynecological examination (general) (routine) without abnormal findings: Secondary | ICD-10-CM

## 2019-01-16 NOTE — Progress Notes (Signed)
Pt presents for annual and vaginal STD testing only.

## 2019-01-16 NOTE — Addendum Note (Signed)
Addended by: Maryruth Eve on: 01/16/2019 04:13 PM   Modules accepted: Orders

## 2019-01-16 NOTE — Progress Notes (Signed)
GYNECOLOGY ANNUAL PREVENTATIVE CARE ENCOUNTER NOTE  Subjective:   [name] is a 28 y.o. G56P1001 female here for a annual gynecologic exam. Current complaints: no complaints, here for pap.  Denies abnormal vaginal bleeding, discharge, pelvic pain, problems with intercourse or other gynecologic concerns.  Not having bleeding, since she got Depo. Declines HIV/RPR/Hep B&C.  Felt a lump in her left breast. Noticed it a couple of days ago. Not painful, but is new for her. Also reports occasional "milky" discharge from left nipple when she squeezes it. Stopped breastfeeding one year ago, hasn't happened since.   Gynecologic History Patient's last menstrual period was 10/25/2018. Contraception: Depo-Provera injections Last Pap: 04/2017. Results were: CIN1 Last mammogram: n/a Gardisil: has received 1 dose  Obstetric History OB History  Gravida Para Term Preterm AB Living  _0 0 1 1  SAB TAB Ectopic Multiple Live Births  0 0 0 0 1    # Outcome Date GA Lbr Len/2nd Weight Sex Delivery Anes PTL Lv  2 AB 10/25/18 [redacted]w[redacted]d        1 Term 11/06/17 487w1d5:07 / 02:51 6 lb 2 oz (2.778 kg) F Vag-Spont EPI  LIV    Past Medical History:  Diagnosis Date  . Abdominal pain affecting pregnancy 10/02/2017  . Abnormal glucose tolerance test (GTT) during pregnancy, antepartum 09/13/2017   Results for COKALISI, BEVILLMRN [id number] of 09/13/2017 15:11  Ref. Range 08/16/2017 10:22 Glucose, 1 hour Latest Ref Range: 65 - 179 mg/dL 119 Glucose, Fasting Latest Ref Range: 65 - 91 mg/dL 92 (H) Glucose, 2 hour Latest Ref Range: 65 - 152 mg/dL 83   . Abnormal hemoglobin (HCC) 05/02/2017   HGBS-C [XValu.Nieves heme referral _1  genetics referral [XValu.Nieves baseline CBC, CMP, urinalysis, UPC/24-hr urine protein [XValu.Nieves folic acid 5 mg/day   . Adjustment disorder with depressed mood   . Antepartum low grade squamous intraepithelial cervical dysplasia complicating pregnancy   . Cervical transverse process fracture ([facility]  .  Complication of anesthesia   . Gestational diabetes 10/19/2017  . Lumbar transverse process fracture ([facility]  . No pertinent past medical history   . Pelvic fracture ([facility]  . PTSD (post-traumatic stress disorder)   . Sickle cell trait ([facility]  . Supervision of normal first pregnancy, antepartum 04/26/2017   .. [facility] Provider Office Location [facility] Dating  U/S Language  English Anatomy USKoreaNormal anatomy; female fetus  F/u growth ordered  Flu Vaccine  Declined 09/27/17 Genetic Screen  NIPS: Panaroma:  Neg  AFP:   First Screen:  Quad:   TDaP vaccine   Declined 08-30-17 Hgb A1C or  GTT Early  Third trimester normal Rhogam  N/a A+   LAB RESULTS  Feeding Plan Breast Blood Type A/Positive/-- (04/03  . SVD (spontaneous vaginal delivery) 11/06/2017  . TBI [facility]traumatic [facility] injury) [facility]    Past Surgical History:  Procedure Laterality Date  . APPLICATION OF WOUND VAC Left 06/30/2015   Procedure: APPLICATION OF WOUND VAC ;  Surgeon: MiAltamese CarolinaMD;  [facility] Service: Orthopedics;  Laterality: Left;  L ankle and L calf  . CLOSED REDUCTION NASAL FRACTURE N/A 06/28/2015   Procedure: CLOSED REDUCTION NASoSepto FRACTURE;  Surgeon: SuLeta BaptistMD;  [facility];  Service: ENT;  Laterality: N/A;  . ESOPHAGOSCOPY N/A 06/28/2015   Procedure: ESOPHAGOSCOPY;  Surgeon: SuLeta BaptistMD;  [facility] Service: ENT;  Laterality: N/A;  .  EXTERNAL FIXATION LEG Left 06/28/2015   Procedure: EXTERNAL FIXATION LEG;  Surgeon: Rod Can, MD;  Location: [place];  Service: Orthopedics;  Laterality: Left;  . EXTERNAL FIXATION LEG Left 07/03/2015   Procedure: REMOVAL OF EXTERNAL FIXATION LEFT LEG;  Surgeon: Altamese [place] Hill, MD;  Location: [place];  Service: Orthopedics;  Laterality: Left;  . FEMUR IM NAIL Left 07/03/2015   Procedure: INTRAMEDULLARY (IM) NAIL FEMORAL;  Surgeon: Altamese [place], MD;  Location: [place];  Service: Orthopedics;  Laterality: Left;  . I & D EXTREMITY Left 06/28/2015   Procedure: IRRIGATION AND  DEBRIDEMENT EXTREMITY;  Surgeon: Rod Can, MD;  Location: [place];  Service: Orthopedics;  Laterality: Left;  . I & D EXTREMITY Left 06/30/2015   Procedure: IRRIGATION AND DEBRIDEMENT 3A TIBIA PROXIMAL PLATEAU AND SHAFT WITH  EXTERNAL FIXATOR ADJUSTMENT;  Surgeon: Altamese Monrovia, MD;  Location: [place];  Service: Orthopedics;  Laterality: Left;  . I & D EXTREMITY Left 07/03/2015   Procedure: IRRIGATION AND DEBRIDEMENT LEFT OPEN TIBIA;  Surgeon: Altamese [place], MD;  Location: [place];  Service: Orthopedics;  Laterality: Left;  . IRRIGATION AND DEBRIDEMENT FOOT Left 06/30/2015   Procedure: IRRIGATION AND DEBRIDEMENT OPEN 3A LEFT ANKLE JOINT DISLOCATION AND OPEN LEFT BIMALLEOUS FRACTURE ;  Surgeon: Altamese Kennedale, MD;  Location: [place];  Service: Orthopedics;  Laterality: Left;  . LARYNGOSCOPY AND BRONCHOSCOPY N/A 06/28/2015   Procedure: LARYNGOSCOPY AND BRONCHOSCOPY;  Surgeon: Leta Baptist, MD;  Location: [place] [facility];  Service: ENT;  Laterality: N/A;  . ORIF ACETABULAR FRACTURE N/A 07/03/2015   Procedure: OPEN REDUCTION INTERNAL FIXATION (ORIF) ACETABULAR FRACTURE;  Surgeon: Altamese Claflin, MD;  Location: [place];  Service: Orthopedics;  Laterality: N/A;  . ORIF ANKLE FRACTURE Left 06/30/2015   Procedure: OPEN REDUCTION INTERNAL FIXATION (ORIF) ANTERIOR COLUMN ACETABULUM;  Surgeon: Altamese Hurdland, MD;  Location: [place];  Service: Orthopedics;  Laterality: Left;  . ORIF PELVIC FRACTURE Bilateral 06/30/2015   Procedure:  TRANS-SACRAL SCREWS ;  Surgeon: Altamese Keener, MD;  Location: [place];  Service: Orthopedics;  Laterality: Bilateral;  . ORIF TIBIA PLATEAU Left 07/03/2015   Procedure: OPEN REDUCTION INTERNAL FIXATION (ORIF) TIBIAL PLATEAU;  Surgeon: Altamese , MD;  Location: [place];  Service: Orthopedics;  Laterality: Left;    Current Outpatient Medications on File Prior to Visit  Medication Sig Dispense Refill  . acetaminophen (TYLENOL) 500 MG tablet Take 500 mg by mouth every 6 (six) hours as needed for moderate pain.    .  medroxyPROGESTERone (DEPO-PROVERA) 150 MG/ML injection Inject 1 mL (150 mg total) into the muscle every 3 (three) months. 1 mL 3  . HYDROcodone-acetaminophen (NORCO/VICODIN) 5-325 MG tablet Take 1 tablet by mouth every 4 (four) hours as needed. (Patient not taking: Reported on 12/07/2018) 10 tablet 0  . ibuprofen (ADVIL,MOTRIN) 600 MG tablet Take 1 tablet (600 mg total) by mouth every 6 (six) hours. (Patient not taking: Reported on 01/29/2018) 60 tablet 1  . medroxyPROGESTERone (DEPO-PROVERA) 150 MG/ML injection Inject 1 mL (150 mg total) into the muscle every 3 (three) months. (Patient not taking: Reported on 10/26/2018) 1 mL 4  . Prenat-FePoly-Metf-FA-DHA-DSS (VITAFOL FE+) 90-1-200 & 50 MG CPPK Take 2 tablets by mouth at bedtime. (Patient not taking: Reported on 10/26/2018) 60 each 12  . senna-docusate (SENOKOT-S) 8.6-50 MG tablet Take 2 tablets by mouth daily. (Patient not taking: Reported on 01/29/2018) 20 tablet 0   Current Facility-Administered Medications on File Prior to Visit  Medication Dose Route Frequency Provider Last Rate Last Admin  .  medroxyPROGESTERone (DEPO-PROVERA) injection 150 mg  150 mg Intramuscular Q90 days Constant, Peggy, MD   150 mg at 01/29/18 1605    Allergies  Allergen Reactions  . Mushroom Extract Complex Hives    Social History   Socioeconomic History  . Marital status: Single    Spouse name: Not on file  . Number of children: Not on file  . Years of education: Not on file  . Highest education level: Not on file  Occupational History  . Not on file  Tobacco Use  . Smoking status: Current Every Day Smoker    Packs/day: 0.50    Types: Cigarettes    Last attempt to quit: 02/2017    Years since quitting: 1.8  . Smokeless tobacco: Never Used  Substance and Sexual Activity  . Alcohol use: No    Alcohol/week: 0.0 standard drinks  . Drug use: No  . Sexual activity: Yes    Birth control/protection: Injection  Other Topics Concern  . Not on file  Social  History Narrative   ** Merged History Encounter **       Social Determinants of Health   Financial Resource Strain:   . Difficulty of Paying Living Expenses: Not on file  Food Insecurity:   . Worried About Charity fundraiser in the Last Year: Not on file  . Ran Out of Food in the Last Year: Not on file  Transportation Needs:   . Lack of Transportation (Medical): Not on file  . Lack of Transportation (Non-Medical): Not on file  Physical Activity:   . Days of Exercise per Week: Not on file  . Minutes of Exercise per Session: Not on file  Stress:   . Feeling of Stress : Not on file  Social Connections:   . Frequency of Communication with Friends and Family: Not on file  . Frequency of Social Gatherings with Friends and Family: Not on file  . Attends Religious Services: Not on file  . Active Member of Clubs or Organizations: Not on file  . Attends Archivist Meetings: Not on file  . Marital Status: Not on file  Intimate Partner Violence:   . Fear of Current or Ex-Partner: Not on file  . Emotionally Abused: Not on file  . Physically Abused: Not on file  . Sexually Abused: Not on file    Family History  Problem Relation Age of Onset  . ADD / ADHD Neg Hx   . Alcohol abuse Neg Hx   . Arthritis Neg Hx   . Anxiety disorder Neg Hx   . Asthma Neg Hx   . Birth defects Neg Hx   . Cancer Neg Hx   . COPD Neg Hx   . Depression Neg Hx   . Diabetes Neg Hx   . Drug abuse Neg Hx   . Early death Neg Hx   . Hearing loss Neg Hx   . Heart disease Neg Hx   . Hyperlipidemia Neg Hx   . Hypertension Neg Hx   . Intellectual disability Neg Hx   . Kidney disease Neg Hx   . Miscarriages / Stillbirths Neg Hx   . Learning disabilities Neg Hx   . Obesity Neg Hx   . Vision loss Neg Hx   . Varicose Veins Neg Hx   . Stroke Neg Hx     The following portions of the patient's history were reviewed and updated as appropriate: allergies, current medications, past family history, past  medical history, past social  history, past surgical history and problem list.  Review of Systems Pertinent items are noted in HPI.   Objective:  Ht _0  (1.803 m)   Wt 223 lb 8 oz (101.4 kg)   LMP 10/25/2018   BMI 31.17 kg/m  CONSTITUTIONAL: Well-developed, well-nourished female in no acute distress.  HENT:  Normocephalic, atraumatic, External right and left ear normal. Oropharynx is clear and moist EYES: Conjunctivae and EOM are normal. Pupils are equal, round, and reactive to light. No scleral icterus.  NECK: Normal range of motion, supple, no masses.  Normal thyroid.  SKIN: Skin is warm and dry. No rash noted. Not diaphoretic. No erythema. No pallor. NEUROLOGIC: Alert and oriented to person, place, and time. Normal reflexes, muscle tone coordination. No cranial nerve deficit noted. PSYCHIATRIC: Normal mood and affect. Normal behavior. Normal judgment and thought content. CARDIOVASCULAR: Normal heart rate noted, regular rhythm RESPIRATORY:  Effort normal, no problems with respiration noted. BREASTS: Symmetric in size. No skin changes, nipple drainage, or lymphadenopathy. Left breast with mobile, non-tender mass approx 2 x 3 cm at 10 o'clock approx 3 cm from areola. Pt expressed small amount white discharge from nipple ABDOMEN: Soft, normal bowel sounds, no distention noted.  No tenderness, rebound or guarding.  PELVIC: Normal appearing external genitalia; normal appearing vaginal mucosa and cervix.  No abnormal discharge noted.  Pap smear obtained.  Normal uterine size, no other palpable masses, no uterine or adnexal tenderness. MUSCULOSKELETAL: Normal range of motion. No tenderness.  No cyanosis, clubbing, or edema.  2+ distal pulses.  Exam done with chaperone present.  Assessment and Plan:   1. Well woman exam 2nd dose gardisil today  2. Cervical cancer screening  3. Dysplasia of cervix, low grade (CIN 1) - Cytology - PAP( [facility])  4. Routine screening for STI  (sexually transmitted infection) - Cervicovaginal ancillary only( [place]) - Hepatitis B surface antigen - Hepatitis C Antibody - HIV antibody (with reflex) - RPR  5. Breast lump - non-tender but new, needs imaging - left breast US  6. Nipple discharge in female - Prolactin - TSH - US Breast and Axilla Limited Left; Future - hCG, quantitative, pregnancy - Comp Met ([facility])   Will follow up results of pap smear/STI screen and manage accordingly. Encouraged improvement in diet and exercise.  Mammogram n/a Flu vaccine declined Gardisil, will give 2nd dose today  Routine preventative health maintenance measures emphasized. Please refer to After Visit Summary for other counseling recommendations.   Total face-to-face time with patient: 30 minutes. Over 50% of encounter was spent on counseling and coordination of care.   Feliz Beam, M.D. Attending Center for [employer] farm manager)

## 2019-01-17 LAB — COMPREHENSIVE METABOLIC PANEL
ALT: 55 IU/L — ABNORMAL HIGH (ref 0–32)
AST: 41 IU/L — ABNORMAL HIGH (ref 0–40)
Albumin/Globulin Ratio: 1.6 (ref 1.2–2.2)
Albumin: 4.5 g/dL (ref 3.9–5.0)
Alkaline Phosphatase: 71 IU/L (ref 39–117)
BUN/Creatinine Ratio: 8 — ABNORMAL LOW (ref 9–23)
BUN: 6 mg/dL (ref 6–20)
Bilirubin Total: 1.3 mg/dL — ABNORMAL HIGH (ref 0.0–1.2)
CO2: 20 mmol/L (ref 20–29)
Calcium: 9.2 mg/dL (ref 8.7–10.2)
Chloride: 108 mmol/L — ABNORMAL HIGH (ref 96–106)
Creatinine, Ser: 0.76 mg/dL (ref 0.57–1.00)
GFR calc Af Amer: 124 mL/min/{1.73_m2} (ref >59)
GFR calc non Af Amer: 108 mL/min/{1.73_m2} (ref >59)
Globulin, Total: 2.8 g/dL (ref 1.5–4.5)
Glucose: 101 mg/dL — ABNORMAL HIGH (ref 65–99)
Potassium: 4 mmol/L (ref 3.5–5.2)
Sodium: 141 mmol/L (ref 134–144)
Total Protein: 7.3 g/dL (ref 6.0–8.5)

## 2019-01-17 LAB — PROLACTIN: Prolactin: 18.1 ng/mL (ref 4.8–23.3)

## 2019-01-17 LAB — RPR: RPR Ser Ql: NONREACTIVE

## 2019-01-17 LAB — HEPATITIS B SURFACE ANTIGEN: Hepatitis B Surface Ag: NEGATIVE

## 2019-01-17 LAB — TSH: TSH: 1.82 u[IU]/mL (ref 0.450–4.500)

## 2019-01-17 LAB — HEPATITIS C ANTIBODY: Hep C Virus Ab: <0.1 s/co ratio (ref 0.0–0.9)

## 2019-01-17 LAB — HIV ANTIBODY (ROUTINE TESTING W REFLEX): HIV Screen 4th Generation wRfx: NONREACTIVE

## 2019-01-17 LAB — BETA HCG QUANT (REF LAB): hCG Quant: <1 m[IU]/mL

## 2019-01-22 LAB — CERVICOVAGINAL ANCILLARY ONLY
Chlamydia: POSITIVE — AB
Comment: NEGATIVE
Comment: NEGATIVE
Comment: NORMAL
Neisseria Gonorrhea: POSITIVE — AB
Trichomonas: NEGATIVE

## 2019-01-23 ENCOUNTER — Other Ambulatory Visit: Payer: Self-pay

## 2019-01-23 ENCOUNTER — Ambulatory Visit (INDEPENDENT_AMBULATORY_CARE_PROVIDER_SITE_OTHER): Payer: Medicaid Other | Admitting: *Deleted

## 2019-01-23 VITALS — BP 124/77 | HR 88 | Wt 220.0 lb

## 2019-01-23 DIAGNOSIS — A549 Gonococcal infection, unspecified: Secondary | ICD-10-CM

## 2019-01-23 DIAGNOSIS — A749 Chlamydial infection, unspecified: Secondary | ICD-10-CM

## 2019-01-23 MED ORDER — CEFTRIAXONE SODIUM 250 MG IJ SOLR
250.0000 mg | Freq: Once | INTRAMUSCULAR | Status: AC
  Administered 2019-01-23: 16:00:00 250 mg via INTRAMUSCULAR

## 2019-01-23 MED ORDER — AZITHROMYCIN 500 MG PO TABS
1000.0000 mg | ORAL_TABLET | Freq: Once | ORAL | Status: AC
  Administered 2019-01-23: 16:00:00 1000 mg via ORAL

## 2019-01-23 NOTE — Progress Notes (Signed)
Pt is in office for Azithromycin and Rocephin for +CH and GC. Pt has NKDA.  Both Azithromycin and Rocephin were administered with no problems. Pt made aware to schedule TOC in 6 weeks.   Pt also has area on buttocks that she would like looked at after previous Depo injection.  Pt has raised area with drainage.  Pt adivsed to apply warm compresses and soak in Epsom salt x 2 weeks per Dr Elly Modena.  Pt advised to contact office if symptoms do not resolve after that time.     BP 124/77   Pulse 88   Wt 220 lb (99.8 kg)   BMI 30.68 kg/m    Administrations This Visit    azithromycin (ZITHROMAX) tablet 1,000 mg    Admin Date 01/23/2019 Action Given Dose 1,000 mg Route Oral Administered By Valene Bors, CMA       cefTRIAXone (ROCEPHIN) injection 250 mg    Admin Date 01/23/2019 Action Given Dose 250 mg Route Intramuscular Administered By Valene Bors, CMA

## 2019-01-24 LAB — CYTOLOGY - PAP: Diagnosis: NEGATIVE

## 2019-01-24 NOTE — Progress Notes (Signed)
Patient seen and assessed by nursing staff during this encounter. I have reviewed the chart and agree with the documentation and plan.  Mora Bellman, MD 01/24/2019 10:45 AM

## 2019-02-05 ENCOUNTER — Inpatient Hospital Stay: Admission: RE | Admit: 2019-02-05 | Payer: Medicaid Other | Source: Ambulatory Visit

## 2019-02-27 ENCOUNTER — Other Ambulatory Visit: Payer: Self-pay

## 2019-02-27 ENCOUNTER — Ambulatory Visit (INDEPENDENT_AMBULATORY_CARE_PROVIDER_SITE_OTHER): Payer: Medicaid Other

## 2019-02-27 VITALS — BP 118/78 | HR 97 | Ht 71.0 in | Wt 218.3 lb

## 2019-02-27 DIAGNOSIS — Z3042 Encounter for surveillance of injectable contraceptive: Secondary | ICD-10-CM

## 2019-02-27 MED ORDER — MEDROXYPROGESTERONE ACETATE 150 MG/ML IM SUSP
150.0000 mg | Freq: Once | INTRAMUSCULAR | Status: AC
  Administered 2019-02-27: 150 mg via INTRAMUSCULAR

## 2019-02-27 NOTE — Progress Notes (Signed)
GYN presents for DEPO, given in LUOQ, tolerated well.  Next DEPO 04/21-05/05/2019  Administrations This Visit    medroxyPROGESTERone (DEPO-PROVERA) injection 150 mg    Admin Date 02/27/2019 Action Given Dose 150 mg Route Intramuscular Administered By Maretta Bees, RMA

## 2019-03-06 ENCOUNTER — Ambulatory Visit: Payer: Medicaid Other

## 2019-03-18 ENCOUNTER — Ambulatory Visit: Payer: Medicaid Other

## 2019-05-20 ENCOUNTER — Ambulatory Visit: Payer: Medicaid Other

## 2019-05-21 ENCOUNTER — Other Ambulatory Visit: Payer: Self-pay

## 2019-05-21 ENCOUNTER — Ambulatory Visit (INDEPENDENT_AMBULATORY_CARE_PROVIDER_SITE_OTHER): Payer: Medicaid Other | Admitting: *Deleted

## 2019-05-21 DIAGNOSIS — Z3042 Encounter for surveillance of injectable contraceptive: Secondary | ICD-10-CM

## 2019-05-21 NOTE — Progress Notes (Signed)
Pt is in office for depo injection.  Pt is on time for depo.  Injection given, pt tolerated well.   Pt advised next depo due July 13-27.  Administrations This Visit    medroxyPROGESTERone (DEPO-PROVERA) injection 150 mg    Admin Date 05/21/2019 Action Given Dose 150 mg Route Intramuscular Administered By Lanney Gins, CMA

## 2019-05-22 NOTE — Progress Notes (Signed)
Patient seen and assessed by nursing staff during this encounter. I have reviewed the chart and agree with the documentation and plan. I have also made any necessary editorial changes.  Annelisa Ryback, MD 05/22/2019 4:11 PM    

## 2019-07-01 IMAGING — US US MFM OB FOLLOW-UP
1 series · 14 of 28 positions shown · non-contrast
Comparison: none

[Series 1: us mfm ob follow-up · 53 acquisitions, 14 frames shown]
[im 2/53]
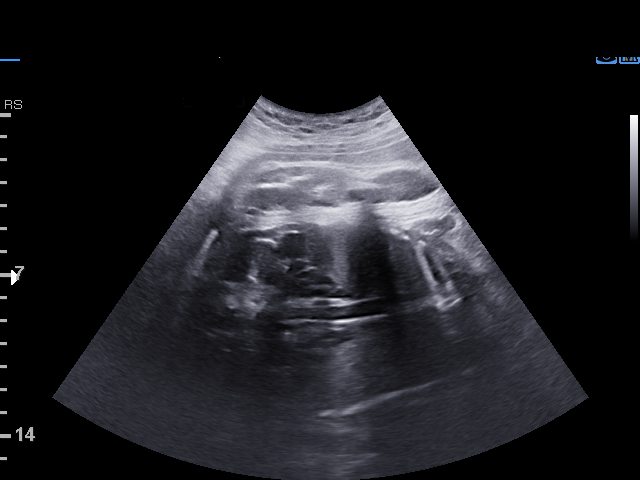
[im 6/53]
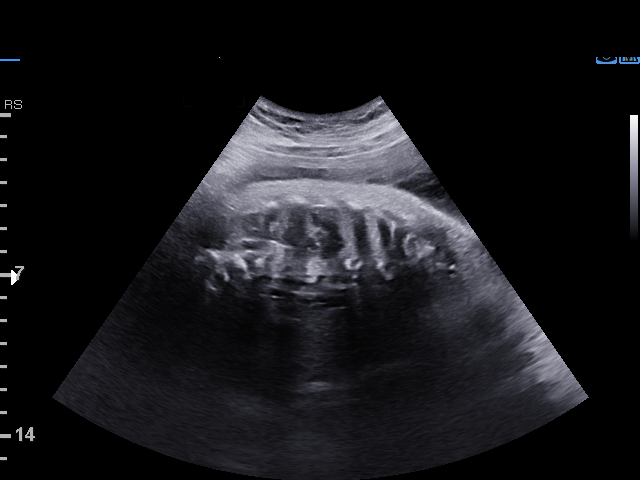
[im 10/53]
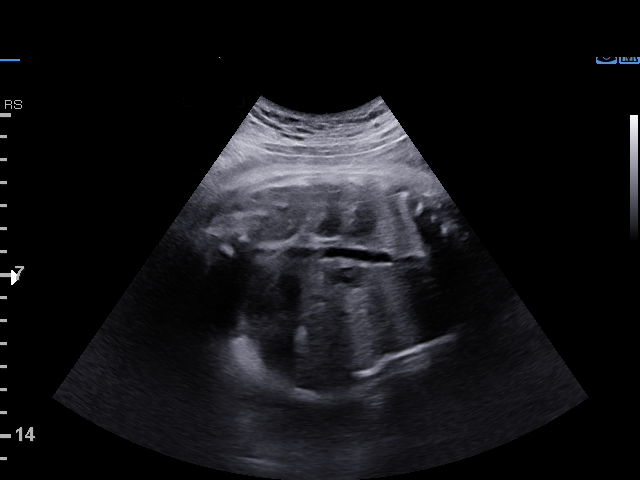
[im 14/53]
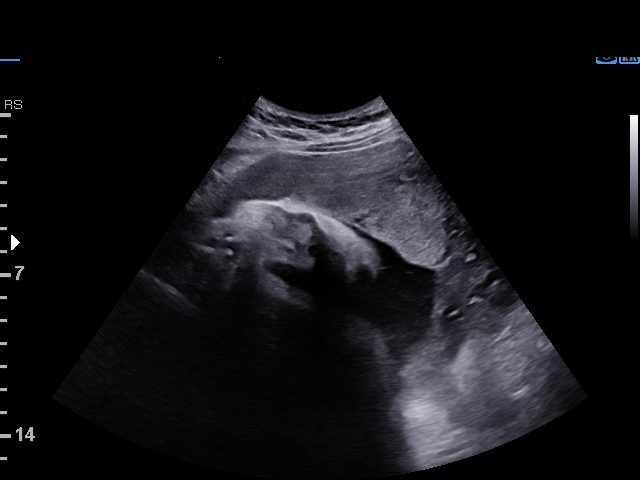
[im 18/53]
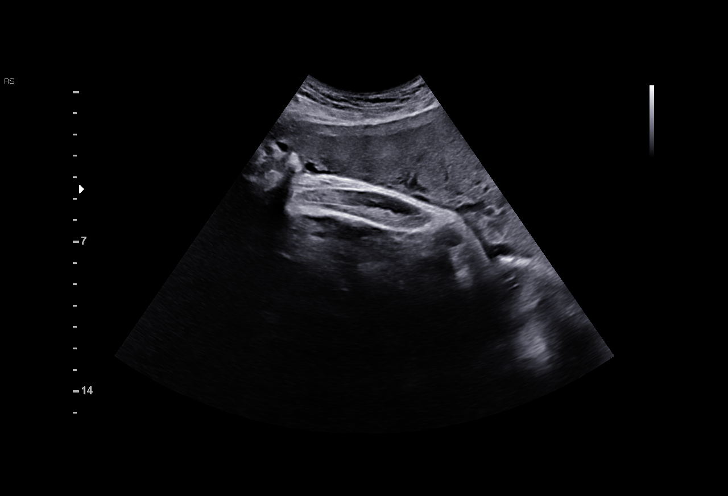
[im 22/53]
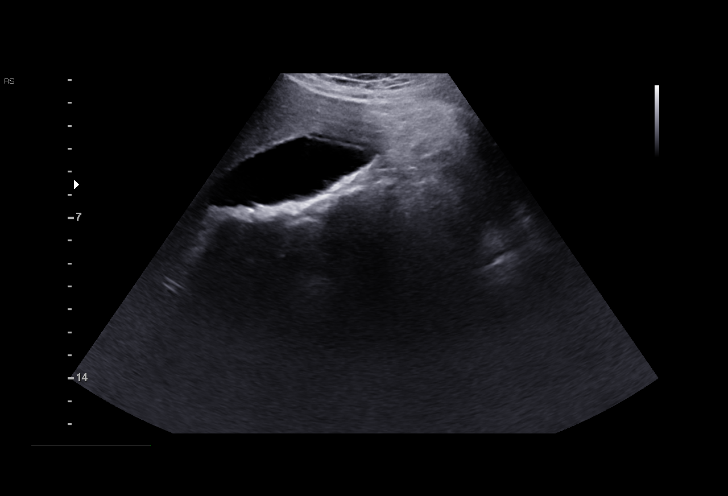
[im 26/53]
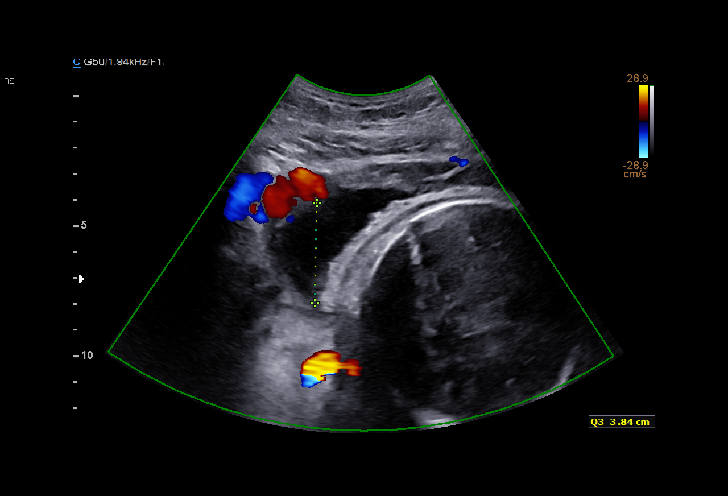
[im 29/53]
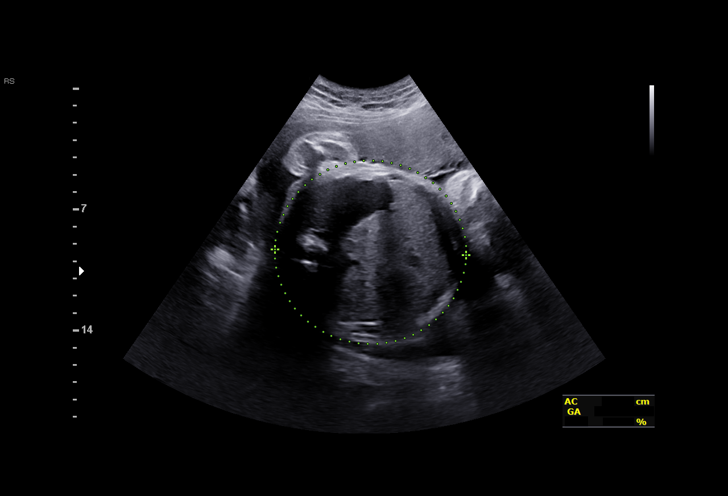
[im 33/53]
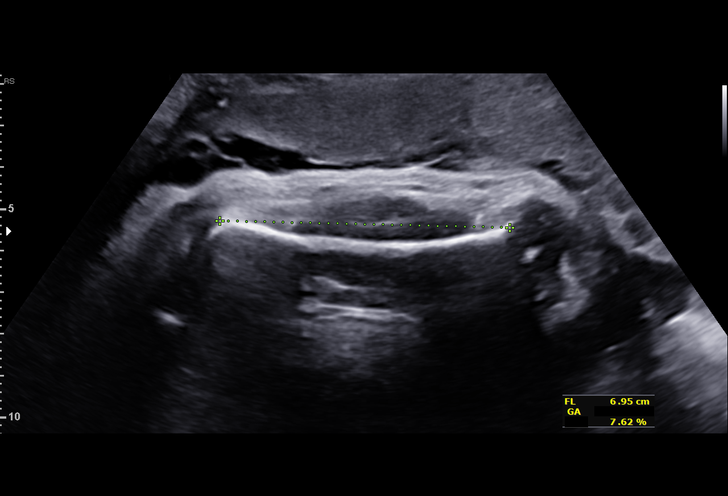
[im 37/53]
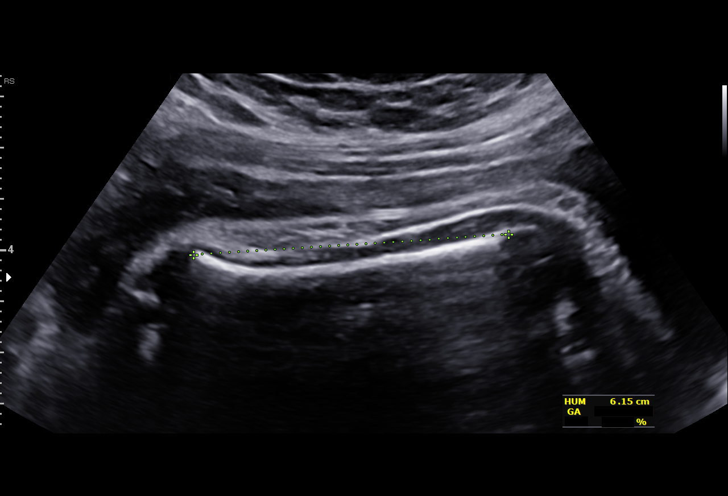
[im 41/53]
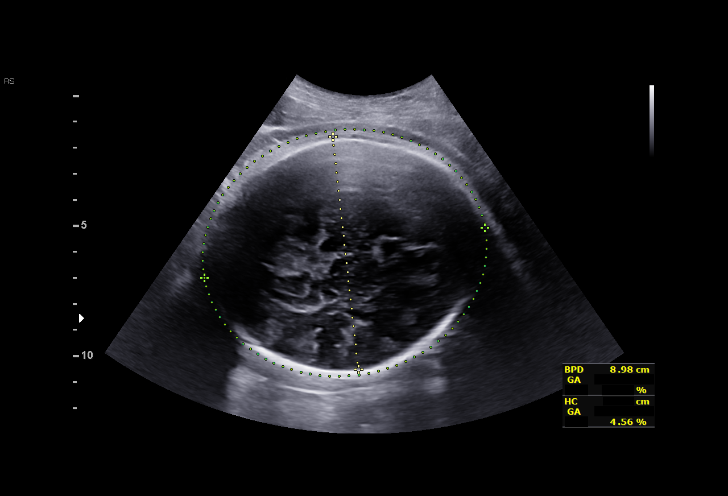
[im 45/53]
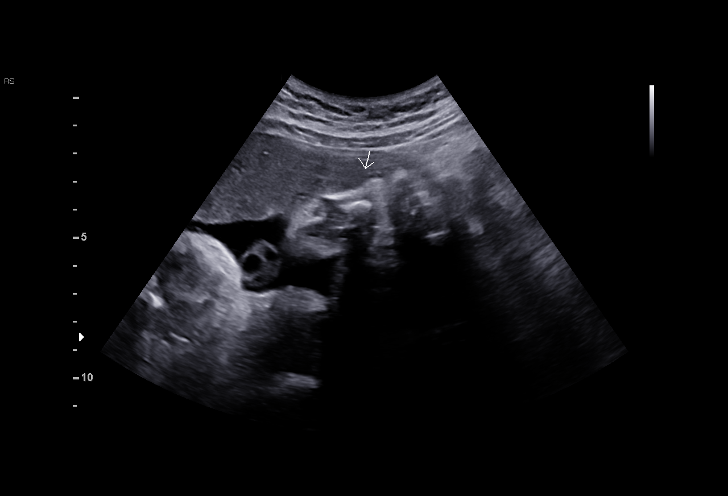
[im 49/53]
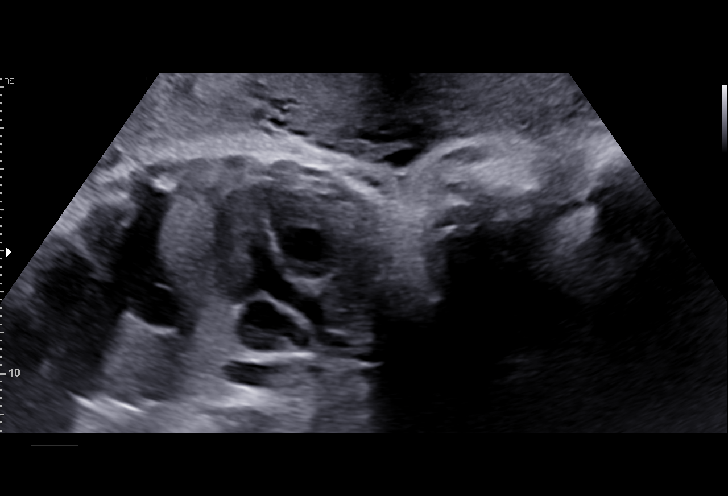
[im 53/53]
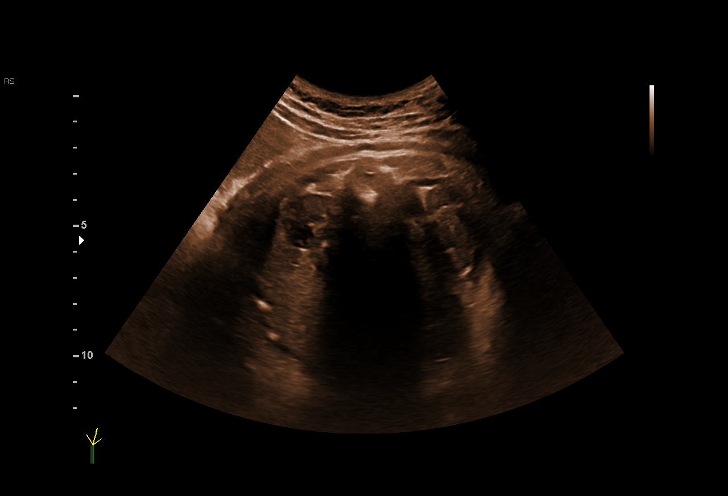

[14 of 28 positions shown; findings below may reference images not displayed]

OB/Gyn Clinic
Attending:        Ger Lubbe        Location:         [HOSPITAL]

Indications

Encounter for other antenatal screening
follow-up
Maternal sickle cell anemia, third trimester   O99.013,
SC hemoglobin (not trait)
Gestational diabetes in pregnancy, diet
controlled
39 weeks gestation of pregnancy
Vital Signs

Height:        5'11"
Fetal Evaluation

Num Of Fetuses:         1
Fetal Heart Rate(bpm):  145
Cardiac Activity:       Observed
Presentation:           Cephalic
Placenta:               Anterior
P. Cord Insertion:      Previously Visualized

Amniotic Fluid
AFI FV:      Within normal limits
AFI Sum(cm)     %Tile       Largest Pocket(cm)
16.13           66

RUQ(cm)       RLQ(cm)       LUQ(cm)        LLQ(cm)
5.36
Biophysical Evaluation

Amniotic F.V:   Pocket => 2 cm two         F. Tone:        Observed
planes
F. Movement:    Observed                   Score:          [DATE]
F. Breathing:   Observed
Biometry

BPD:      89.2  mm     G. Age:  36w 1d          9  %    CI:         76.5   %    70 - 86
FL/HC:      21.9   %    20.6 -
HC:      323.1  mm     G. Age:  36w 4d        < 3  %    HC/AC:      0.96        0.87 -
AC:      337.8  mm     G. Age:  37w 5d         29  %    FL/BPD:     79.4   %    71 - 87
FL:       70.8  mm     G. Age:  36w 2d          4  %    FL/AC:      21.0   %    20 - 24
HUM:      60.6  mm     G. Age:  35w 1d        < 5  %

Est. FW:    4177  gm    6 lb 13 oz      40  %
OB History

Gravidity:    1         Term:   0        Prem:   0        SAB:   0
TOP:          0       Ectopic:  0        Living: 0
Gestational Age

LMP:           38w 5d        Date:  02/02/17                 EDD:   11/09/17
U/S Today:     36w 5d                                        EDD:   11/23/17
Best:          39w 2d     Det. By:  Early Ultrasound         EDD:   11/05/17
(03/21/17)
Anatomy

Cranium:               Appears normal         Aortic Arch:            Previously seen
Cavum:                 Previously seen        Ductal Arch:            Previously seen
Ventricles:            Appears normal         Diaphragm:              Previously seen
Choroid Plexus:        Previously seen        Stomach:                Appears normal, left
sided
Cerebellum:            Previously seen        Abdomen:                Appears normal
Posterior Fossa:       Previously seen        Abdominal Wall:         Previously seen
Nuchal Fold:           Previously seen        Cord Vessels:           Previously seen
Face:                  Orbits and profile     Kidneys:                Appear normal
previously seen
Lips:                  Previously seen        Bladder:                Appears normal
Thoracic:              Appears normal         Spine:                  Previously seen
Heart:                 Appears normal         Upper Extremities:      Previously seen
(4CH, axis, and situs
RVOT:                  Previously seen        Lower Extremities:      Previously seen
LVOT:                  Appears normal

Other:  Fetus appears to be a female. Heels and 5th digit previously seen.
Nasal bone visualized. Technically difficult due to fetal position.
Cervix Uterus Adnexa
Cervix
Normal appearance by transabdominal scan.

Uterus
No abnormality visualized.

Left Ovary
Within normal limits.

Right Ovary
Not visualized.

Adnexa
No abnormality visualized. No adnexal mass
visualized.
Comments

Comments:
U/S images reviewed. Findings reviewed with patient.
Appropriate fetal growth is noted.  No fetal abnormalities are
seen.  No evidence of fetal compromise is found on BPP
today.
Questions answered.
10 minutes spent face to face with patient.
Recommendations: 1) Induction is scheduled for 11/05/17
Recommendations

Induction is scheduled for 11/05/17

## 2019-08-13 ENCOUNTER — Ambulatory Visit (HOSPITAL_BASED_OUTPATIENT_CLINIC_OR_DEPARTMENT_OTHER): Payer: Medicaid Other

## 2019-08-13 ENCOUNTER — Other Ambulatory Visit: Payer: Self-pay

## 2019-08-13 DIAGNOSIS — Z3042 Encounter for surveillance of injectable contraceptive: Secondary | ICD-10-CM

## 2019-08-13 NOTE — Progress Notes (Signed)
Nurse visit for pt supply Depo  Pt is on time for injection Depo given RUOQ without difficulty Next Depo due Oct 5-19, pt agrees.   Pt reports pain and "pus" at injection site after the last injection in April.  Advised pt to monitor this injection and contact the office if needed.  Administrations This Visit    medroxyPROGESTERone (DEPO-PROVERA) injection 150 mg    Admin Date 08/13/2019 Action Given Dose 150 mg Route Intramuscular Administered By Lewayne Bunting, CMA

## 2019-08-14 NOTE — Progress Notes (Signed)
Patient was assessed and managed by nursing staff during this encounter. I have reviewed the chart and agree with the documentation and plan. I have also made any necessary editorial changes.  Jaynie Collins, MD 08/14/2019 8:07 AM

## 2019-11-05 ENCOUNTER — Other Ambulatory Visit: Payer: Self-pay

## 2019-11-05 ENCOUNTER — Ambulatory Visit (INDEPENDENT_AMBULATORY_CARE_PROVIDER_SITE_OTHER): Payer: Medicaid Other

## 2019-11-05 VITALS — BP 125/79 | HR 84 | Wt 245.0 lb

## 2019-11-05 DIAGNOSIS — Z3042 Encounter for surveillance of injectable contraceptive: Secondary | ICD-10-CM

## 2019-11-05 MED ORDER — MEDROXYPROGESTERONE ACETATE 150 MG/ML IM SUSP
150.0000 mg | Freq: Once | INTRAMUSCULAR | Status: AC
  Administered 2019-11-05: 150 mg via INTRAMUSCULAR

## 2019-11-05 NOTE — Progress Notes (Signed)
29 y.o GYN presents for DEPO Injection, given in LUOQ, tolerated well.  Next DEPO 01/21/20 - 01/11/222  Last PAP 01/16/2019. Needs Annual Exam to get refills.  Administrations This Visit    medroxyPROGESTERone (DEPO-PROVERA) injection 150 mg    Admin Date 11/05/2019 Action Given Dose 150 mg Route Intramuscular Administered By Maretta Bees, RMA

## 2019-11-06 NOTE — Progress Notes (Signed)
Patient was assessed and managed by nursing staff during this encounter. I have reviewed the chart and agree with the documentation and plan. I have also made any necessary editorial changes.  Jaynie Collins, MD 11/06/2019 8:38 AM

## 2019-12-05 DIAGNOSIS — Z114 Encounter for screening for human immunodeficiency virus [HIV]: Secondary | ICD-10-CM | POA: Diagnosis not present

## 2019-12-05 DIAGNOSIS — N915 Oligomenorrhea, unspecified: Secondary | ICD-10-CM | POA: Diagnosis not present

## 2019-12-05 DIAGNOSIS — M79605 Pain in left leg: Secondary | ICD-10-CM | POA: Diagnosis not present

## 2019-12-05 DIAGNOSIS — R0602 Shortness of breath: Secondary | ICD-10-CM | POA: Diagnosis not present

## 2019-12-05 DIAGNOSIS — Z79899 Other long term (current) drug therapy: Secondary | ICD-10-CM | POA: Diagnosis not present

## 2019-12-05 DIAGNOSIS — Z131 Encounter for screening for diabetes mellitus: Secondary | ICD-10-CM | POA: Diagnosis not present

## 2019-12-05 DIAGNOSIS — M129 Arthropathy, unspecified: Secondary | ICD-10-CM | POA: Diagnosis not present

## 2019-12-05 DIAGNOSIS — Z0000 Encounter for general adult medical examination without abnormal findings: Secondary | ICD-10-CM | POA: Diagnosis not present

## 2019-12-05 DIAGNOSIS — M62838 Other muscle spasm: Secondary | ICD-10-CM | POA: Diagnosis not present

## 2019-12-05 DIAGNOSIS — Z20822 Contact with and (suspected) exposure to covid-19: Secondary | ICD-10-CM | POA: Diagnosis not present

## 2019-12-05 DIAGNOSIS — Z1159 Encounter for screening for other viral diseases: Secondary | ICD-10-CM | POA: Diagnosis not present

## 2019-12-05 DIAGNOSIS — Z136 Encounter for screening for cardiovascular disorders: Secondary | ICD-10-CM | POA: Diagnosis not present

## 2019-12-05 DIAGNOSIS — Z7251 High risk heterosexual behavior: Secondary | ICD-10-CM | POA: Diagnosis not present

## 2019-12-05 DIAGNOSIS — R5383 Other fatigue: Secondary | ICD-10-CM | POA: Diagnosis not present

## 2019-12-05 DIAGNOSIS — D539 Nutritional anemia, unspecified: Secondary | ICD-10-CM | POA: Diagnosis not present

## 2019-12-05 DIAGNOSIS — E559 Vitamin D deficiency, unspecified: Secondary | ICD-10-CM | POA: Diagnosis not present

## 2019-12-05 DIAGNOSIS — R350 Frequency of micturition: Secondary | ICD-10-CM | POA: Diagnosis not present

## 2019-12-10 DIAGNOSIS — M79605 Pain in left leg: Secondary | ICD-10-CM | POA: Diagnosis not present

## 2019-12-10 DIAGNOSIS — M62838 Other muscle spasm: Secondary | ICD-10-CM | POA: Diagnosis not present

## 2019-12-10 DIAGNOSIS — Z79899 Other long term (current) drug therapy: Secondary | ICD-10-CM | POA: Diagnosis not present

## 2019-12-10 DIAGNOSIS — E785 Hyperlipidemia, unspecified: Secondary | ICD-10-CM | POA: Diagnosis not present

## 2019-12-10 DIAGNOSIS — E559 Vitamin D deficiency, unspecified: Secondary | ICD-10-CM | POA: Diagnosis not present

## 2019-12-10 DIAGNOSIS — N915 Oligomenorrhea, unspecified: Secondary | ICD-10-CM | POA: Diagnosis not present

## 2019-12-30 DIAGNOSIS — R945 Abnormal results of liver function studies: Secondary | ICD-10-CM | POA: Diagnosis not present

## 2019-12-31 DIAGNOSIS — M25562 Pain in left knee: Secondary | ICD-10-CM | POA: Diagnosis not present

## 2019-12-31 DIAGNOSIS — M79662 Pain in left lower leg: Secondary | ICD-10-CM | POA: Diagnosis not present

## 2020-01-17 ENCOUNTER — Encounter (HOSPITAL_COMMUNITY): Payer: Self-pay

## 2020-01-17 ENCOUNTER — Ambulatory Visit (HOSPITAL_COMMUNITY)
Admission: EM | Admit: 2020-01-17 | Discharge: 2020-01-17 | Disposition: A | Discharge status: Home / Self Care | Payer: BLUE CROSS/BLUE SHIELD | Attending: Family Medicine | Admitting: Family Medicine

## 2020-01-17 ENCOUNTER — Other Ambulatory Visit: Payer: Self-pay

## 2020-01-17 ENCOUNTER — Ambulatory Visit (INDEPENDENT_AMBULATORY_CARE_PROVIDER_SITE_OTHER): Payer: BLUE CROSS/BLUE SHIELD

## 2020-01-17 DIAGNOSIS — S2243XA Multiple fractures of ribs, bilateral, initial encounter for closed fracture: Secondary | ICD-10-CM | POA: Diagnosis not present

## 2020-01-17 DIAGNOSIS — R079 Chest pain, unspecified: Secondary | ICD-10-CM

## 2020-01-17 DIAGNOSIS — S161XXA Strain of muscle, fascia and tendon at neck level, initial encounter: Secondary | ICD-10-CM

## 2020-01-17 DIAGNOSIS — M954 Acquired deformity of chest and rib: Secondary | ICD-10-CM | POA: Diagnosis not present

## 2020-01-17 DIAGNOSIS — S20219A Contusion of unspecified front wall of thorax, initial encounter: Secondary | ICD-10-CM

## 2020-01-17 DIAGNOSIS — M549 Dorsalgia, unspecified: Secondary | ICD-10-CM | POA: Diagnosis not present

## 2020-01-17 MED ORDER — TIZANIDINE HCL 4 MG PO TABS: 4.0000 mg | ORAL_TABLET | Freq: Four times a day (QID) | ORAL | 0 refills | Status: DC | PRN

## 2020-01-17 MED ORDER — HYDROCODONE-ACETAMINOPHEN 7.5-325 MG PO TABS: 1.0000 | ORAL_TABLET | Freq: Four times a day (QID) | ORAL | 0 refills | Status: DC | PRN

## 2020-01-17 NOTE — Discharge Instructions (Addendum)
May take over-the-counter medicines as needed for moderate pain Take hydrocodone as needed for severe pain.  Do not drive on hydrocodone Tizanidine as a muscle relaxer.  Use this as needed.  It is useful at bedtime Ice or heat to painful areas See your primary care doctor if not improving by next week. Call your primary care doctor on Monday to let them know about your accident and prescriptions
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
## 2020-02-05 DIAGNOSIS — Z79899 Other long term (current) drug therapy: Secondary | ICD-10-CM | POA: Diagnosis not present

## 2020-03-06 DIAGNOSIS — Z79899 Other long term (current) drug therapy: Secondary | ICD-10-CM | POA: Diagnosis not present

## 2020-04-28 DIAGNOSIS — Z79899 Other long term (current) drug therapy: Secondary | ICD-10-CM | POA: Diagnosis not present

## 2020-08-04 DIAGNOSIS — Z79899 Other long term (current) drug therapy: Secondary | ICD-10-CM | POA: Diagnosis not present

## 2020-08-06 DIAGNOSIS — Z79899 Other long term (current) drug therapy: Secondary | ICD-10-CM | POA: Diagnosis not present

## 2020-08-24 DIAGNOSIS — K8020 Calculus of gallbladder without cholecystitis without obstruction: Secondary | ICD-10-CM | POA: Diagnosis not present

## 2020-08-24 DIAGNOSIS — R945 Abnormal results of liver function studies: Secondary | ICD-10-CM | POA: Diagnosis not present

## 2020-09-01 DIAGNOSIS — Z79899 Other long term (current) drug therapy: Secondary | ICD-10-CM | POA: Diagnosis not present

## 2020-09-03 DIAGNOSIS — Z79899 Other long term (current) drug therapy: Secondary | ICD-10-CM | POA: Diagnosis not present

## 2020-11-20 DIAGNOSIS — Z79899 Other long term (current) drug therapy: Secondary | ICD-10-CM | POA: Diagnosis not present

## 2020-11-24 DIAGNOSIS — Z79899 Other long term (current) drug therapy: Secondary | ICD-10-CM | POA: Diagnosis not present

## 2020-12-09 DIAGNOSIS — R768 Other specified abnormal immunological findings in serum: Secondary | ICD-10-CM | POA: Diagnosis not present

## 2021-01-03 DIAGNOSIS — Z79899 Other long term (current) drug therapy: Secondary | ICD-10-CM | POA: Diagnosis not present

## 2021-01-05 DIAGNOSIS — Z79899 Other long term (current) drug therapy: Secondary | ICD-10-CM | POA: Diagnosis not present

## 2021-02-16 DIAGNOSIS — Z79899 Other long term (current) drug therapy: Secondary | ICD-10-CM | POA: Diagnosis not present

## 2021-02-18 DIAGNOSIS — Z79899 Other long term (current) drug therapy: Secondary | ICD-10-CM | POA: Diagnosis not present

## 2021-03-12 DIAGNOSIS — A499 Bacterial infection, unspecified: Secondary | ICD-10-CM | POA: Diagnosis not present

## 2021-03-12 DIAGNOSIS — Z7251 High risk heterosexual behavior: Secondary | ICD-10-CM | POA: Diagnosis not present

## 2021-03-12 DIAGNOSIS — N898 Other specified noninflammatory disorders of vagina: Secondary | ICD-10-CM | POA: Diagnosis not present

## 2021-03-12 DIAGNOSIS — N390 Urinary tract infection, site not specified: Secondary | ICD-10-CM | POA: Diagnosis not present

## 2021-03-12 DIAGNOSIS — R300 Dysuria: Secondary | ICD-10-CM | POA: Diagnosis not present

## 2021-04-27 DIAGNOSIS — Z79899 Other long term (current) drug therapy: Secondary | ICD-10-CM | POA: Diagnosis not present

## 2021-04-29 DIAGNOSIS — Z79899 Other long term (current) drug therapy: Secondary | ICD-10-CM | POA: Diagnosis not present

## 2021-06-28 DIAGNOSIS — Z79899 Other long term (current) drug therapy: Secondary | ICD-10-CM | POA: Diagnosis not present

## 2021-08-10 ENCOUNTER — Ambulatory Visit (INDEPENDENT_AMBULATORY_CARE_PROVIDER_SITE_OTHER): Payer: Medicaid Other

## 2021-08-10 ENCOUNTER — Encounter (HOSPITAL_COMMUNITY): Payer: Self-pay | Admitting: Emergency Medicine

## 2021-08-10 ENCOUNTER — Ambulatory Visit (HOSPITAL_COMMUNITY)
Admission: EM | Admit: 2021-08-10 | Discharge: 2021-08-10 | Disposition: A | Discharge status: Home / Self Care | Payer: Medicaid Other | Attending: Family Medicine | Admitting: Family Medicine

## 2021-08-10 DIAGNOSIS — M25552 Pain in left hip: Secondary | ICD-10-CM | POA: Insufficient documentation

## 2021-08-10 DIAGNOSIS — R102 Pelvic and perineal pain: Secondary | ICD-10-CM | POA: Diagnosis not present

## 2021-08-10 LAB — POCT URINALYSIS DIPSTICK, ED / UC
Bilirubin Urine: NEGATIVE
Glucose, UA: NEGATIVE mg/dL
Ketones, ur: NEGATIVE mg/dL
Nitrite: POSITIVE — AB
Protein, ur: 100 mg/dL — AB
Specific Gravity, Urine: 1.015 (ref 1.005–1.030)
Urobilinogen, UA: 1 mg/dL (ref 0.0–1.0)
pH: 5.5 (ref 5.0–8.0)

## 2021-08-10 LAB — POC URINE PREG, ED: Preg Test, Ur: NEGATIVE

## 2021-08-10 MED ORDER — IBUPROFEN 800 MG PO TABS: 800.0000 mg | ORAL_TABLET | Freq: Three times a day (TID) | ORAL | 0 refills | Status: DC | PRN

## 2021-08-10 MED ORDER — KETOROLAC TROMETHAMINE 30 MG/ML IJ SOLN
INTRAMUSCULAR | Status: AC
  Filled 2021-08-10: qty 1

## 2021-08-10 MED ORDER — CIPROFLOXACIN HCL 500 MG PO TABS: 500.0000 mg | ORAL_TABLET | Freq: Two times a day (BID) | ORAL | 0 refills | Status: AC

## 2021-08-10 MED ORDER — KETOROLAC TROMETHAMINE 30 MG/ML IJ SOLN
30.0000 mg | Freq: Once | INTRAMUSCULAR | Status: AC
  Administered 2021-08-10: 30 mg via INTRAMUSCULAR
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
## 2021-08-10 NOTE — Discharge Instructions (Addendum)
The pregnancy test was negative  Your urinalysis showed some blood and white blood cells and nitrites, possibly indicating you may have a urinary infection.  The x-ray did show some arthritis in your left hip area.  It did also show your old hardware from your previous injuries.  You have been given a shot of Toradol 30 mg today.  Take ibuprofen 800 mg--1 tab every 8 hours as needed for pain.  Cipro 500 mg--1 tablet 2 times daily for 7 days.  This is the antibiotic for possible urinary infection.  A urine culture has been sent, and staff will call you if it looks like the antibiotic needs to be changed

## 2021-08-10 NOTE — ED Triage Notes (Signed)
Pt c/o left hip and pelvic pains for 3 days. Reports that pain so bad can't walk. Took some oxy and muscle relaxer prior to coming in  for pains. Starte menstrual cycle on 7/15, but never had pains this bad.

## 2021-08-12 LAB — URINE CULTURE: Culture: >=100000 — AB

## 2021-09-21 DIAGNOSIS — Z79899 Other long term (current) drug therapy: Secondary | ICD-10-CM | POA: Diagnosis not present

## 2021-09-23 DIAGNOSIS — Z79899 Other long term (current) drug therapy: Secondary | ICD-10-CM | POA: Diagnosis not present

## 2021-10-15 DIAGNOSIS — Z1152 Encounter for screening for COVID-19: Secondary | ICD-10-CM | POA: Diagnosis not present

## 2021-10-21 DIAGNOSIS — Z79899 Other long term (current) drug therapy: Secondary | ICD-10-CM | POA: Diagnosis not present

## 2021-12-20 DIAGNOSIS — Z79899 Other long term (current) drug therapy: Secondary | ICD-10-CM | POA: Diagnosis not present

## 2021-12-22 DIAGNOSIS — Z79899 Other long term (current) drug therapy: Secondary | ICD-10-CM | POA: Diagnosis not present

## 2022-01-13 ENCOUNTER — Encounter (HOSPITAL_COMMUNITY): Payer: Self-pay

## 2022-01-13 ENCOUNTER — Ambulatory Visit (HOSPITAL_COMMUNITY)
Admission: EM | Admit: 2022-01-13 | Discharge: 2022-01-13 | Disposition: A | Discharge status: Home / Self Care | Payer: Medicaid Other | Attending: Sports Medicine | Admitting: Sports Medicine

## 2022-01-13 DIAGNOSIS — R519 Headache, unspecified: Secondary | ICD-10-CM | POA: Diagnosis not present

## 2022-01-13 DIAGNOSIS — R42 Dizziness and giddiness: Secondary | ICD-10-CM

## 2022-01-13 MED ORDER — IBUPROFEN 600 MG PO TABS: 600.0000 mg | ORAL_TABLET | Freq: Three times a day (TID) | ORAL | 0 refills | Status: DC | PRN

## 2022-01-13 NOTE — ED Triage Notes (Signed)
Pt reports dizziness and fatigue x 3 days. Pt reports loss of appetite. Denies any vomiting or diarrhea.
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
## 2022-01-13 NOTE — Discharge Instructions (Addendum)
I think that your dizziness is likely due to your dehydration.  I recommend you increase amount of water you are drinking and have some small meals.  Have also sent 600 mg ibuprofen to your pharmacy that you can take 2-3 times a day if you are drinking water regularly.  For your congestion I recommend an over-the-counter decongestant like Mucinex.  You are likely suffering from a viral illness.  Follow-up with the urgent care or your primary care provider if you do not have any improvement or worsening of your symptoms in the next 5 days.

## 2022-01-14 ENCOUNTER — Ambulatory Visit (HOSPITAL_COMMUNITY): Payer: Medicaid Other

## 2022-02-15 DIAGNOSIS — N915 Oligomenorrhea, unspecified: Secondary | ICD-10-CM | POA: Diagnosis not present

## 2022-02-15 DIAGNOSIS — M62838 Other muscle spasm: Secondary | ICD-10-CM | POA: Diagnosis not present

## 2022-02-15 DIAGNOSIS — Z79899 Other long term (current) drug therapy: Secondary | ICD-10-CM | POA: Diagnosis not present

## 2022-02-15 DIAGNOSIS — G629 Polyneuropathy, unspecified: Secondary | ICD-10-CM | POA: Diagnosis not present

## 2022-02-15 DIAGNOSIS — Z6834 Body mass index (BMI) 34.0-34.9, adult: Secondary | ICD-10-CM | POA: Diagnosis not present

## 2022-02-15 DIAGNOSIS — E669 Obesity, unspecified: Secondary | ICD-10-CM | POA: Diagnosis not present

## 2022-02-15 DIAGNOSIS — F17210 Nicotine dependence, cigarettes, uncomplicated: Secondary | ICD-10-CM | POA: Diagnosis not present

## 2022-02-15 DIAGNOSIS — Z5320 Procedure and treatment not carried out because of patient's decision for unspecified reasons: Secondary | ICD-10-CM | POA: Diagnosis not present

## 2022-02-15 DIAGNOSIS — M79605 Pain in left leg: Secondary | ICD-10-CM | POA: Diagnosis not present

## 2022-03-16 DIAGNOSIS — E669 Obesity, unspecified: Secondary | ICD-10-CM | POA: Diagnosis not present

## 2022-03-16 DIAGNOSIS — Z79899 Other long term (current) drug therapy: Secondary | ICD-10-CM | POA: Diagnosis not present

## 2022-03-16 DIAGNOSIS — G629 Polyneuropathy, unspecified: Secondary | ICD-10-CM | POA: Diagnosis not present

## 2022-03-16 DIAGNOSIS — M62838 Other muscle spasm: Secondary | ICD-10-CM | POA: Diagnosis not present

## 2022-03-16 DIAGNOSIS — N915 Oligomenorrhea, unspecified: Secondary | ICD-10-CM | POA: Diagnosis not present

## 2022-03-16 DIAGNOSIS — G4730 Sleep apnea, unspecified: Secondary | ICD-10-CM | POA: Diagnosis not present

## 2022-03-16 DIAGNOSIS — M79605 Pain in left leg: Secondary | ICD-10-CM | POA: Diagnosis not present

## 2022-03-16 DIAGNOSIS — F17210 Nicotine dependence, cigarettes, uncomplicated: Secondary | ICD-10-CM | POA: Diagnosis not present

## 2022-03-16 DIAGNOSIS — E559 Vitamin D deficiency, unspecified: Secondary | ICD-10-CM | POA: Diagnosis not present

## 2022-03-16 DIAGNOSIS — Z5320 Procedure and treatment not carried out because of patient's decision for unspecified reasons: Secondary | ICD-10-CM | POA: Diagnosis not present

## 2022-03-18 DIAGNOSIS — Z79899 Other long term (current) drug therapy: Secondary | ICD-10-CM | POA: Diagnosis not present

## 2022-04-13 DIAGNOSIS — M62838 Other muscle spasm: Secondary | ICD-10-CM | POA: Diagnosis not present

## 2022-04-13 DIAGNOSIS — M79605 Pain in left leg: Secondary | ICD-10-CM | POA: Diagnosis not present

## 2022-04-13 DIAGNOSIS — N915 Oligomenorrhea, unspecified: Secondary | ICD-10-CM | POA: Diagnosis not present

## 2022-04-13 DIAGNOSIS — F17210 Nicotine dependence, cigarettes, uncomplicated: Secondary | ICD-10-CM | POA: Diagnosis not present

## 2022-04-13 DIAGNOSIS — Z6835 Body mass index (BMI) 35.0-35.9, adult: Secondary | ICD-10-CM | POA: Diagnosis not present

## 2022-04-13 DIAGNOSIS — Z79899 Other long term (current) drug therapy: Secondary | ICD-10-CM | POA: Diagnosis not present

## 2022-04-13 DIAGNOSIS — G629 Polyneuropathy, unspecified: Secondary | ICD-10-CM | POA: Diagnosis not present

## 2022-04-19 DIAGNOSIS — Z79899 Other long term (current) drug therapy: Secondary | ICD-10-CM | POA: Diagnosis not present

## 2022-05-25 DIAGNOSIS — E669 Obesity, unspecified: Secondary | ICD-10-CM | POA: Diagnosis not present

## 2022-05-25 DIAGNOSIS — N915 Oligomenorrhea, unspecified: Secondary | ICD-10-CM | POA: Diagnosis not present

## 2022-05-25 DIAGNOSIS — E559 Vitamin D deficiency, unspecified: Secondary | ICD-10-CM | POA: Diagnosis not present

## 2022-05-25 DIAGNOSIS — F17210 Nicotine dependence, cigarettes, uncomplicated: Secondary | ICD-10-CM | POA: Diagnosis not present

## 2022-05-25 DIAGNOSIS — Z79899 Other long term (current) drug therapy: Secondary | ICD-10-CM | POA: Diagnosis not present

## 2022-05-25 DIAGNOSIS — M62838 Other muscle spasm: Secondary | ICD-10-CM | POA: Diagnosis not present

## 2022-05-25 DIAGNOSIS — M129 Arthropathy, unspecified: Secondary | ICD-10-CM | POA: Diagnosis not present

## 2022-05-25 DIAGNOSIS — M79605 Pain in left leg: Secondary | ICD-10-CM | POA: Diagnosis not present

## 2022-05-25 DIAGNOSIS — G629 Polyneuropathy, unspecified: Secondary | ICD-10-CM | POA: Diagnosis not present

## 2022-05-27 DIAGNOSIS — Z79899 Other long term (current) drug therapy: Secondary | ICD-10-CM | POA: Diagnosis not present

## 2022-07-12 DIAGNOSIS — E559 Vitamin D deficiency, unspecified: Secondary | ICD-10-CM | POA: Diagnosis not present

## 2022-07-12 DIAGNOSIS — E669 Obesity, unspecified: Secondary | ICD-10-CM | POA: Diagnosis not present

## 2022-07-12 DIAGNOSIS — M62838 Other muscle spasm: Secondary | ICD-10-CM | POA: Diagnosis not present

## 2022-07-12 DIAGNOSIS — F17210 Nicotine dependence, cigarettes, uncomplicated: Secondary | ICD-10-CM | POA: Diagnosis not present

## 2022-07-12 DIAGNOSIS — M79605 Pain in left leg: Secondary | ICD-10-CM | POA: Diagnosis not present

## 2022-07-12 DIAGNOSIS — Z79899 Other long term (current) drug therapy: Secondary | ICD-10-CM | POA: Diagnosis not present

## 2022-07-12 DIAGNOSIS — N915 Oligomenorrhea, unspecified: Secondary | ICD-10-CM | POA: Diagnosis not present

## 2022-07-12 DIAGNOSIS — G629 Polyneuropathy, unspecified: Secondary | ICD-10-CM | POA: Diagnosis not present

## 2022-07-14 DIAGNOSIS — Z79899 Other long term (current) drug therapy: Secondary | ICD-10-CM | POA: Diagnosis not present

## 2022-08-29 ENCOUNTER — Encounter (HOSPITAL_COMMUNITY): Payer: Self-pay

## 2022-08-29 ENCOUNTER — Ambulatory Visit (HOSPITAL_COMMUNITY)
Admission: EM | Admit: 2022-08-29 | Discharge: 2022-08-29 | Disposition: A | Discharge status: Home / Self Care | Payer: Medicaid Other

## 2022-08-29 DIAGNOSIS — M26622 Arthralgia of left temporomandibular joint: Secondary | ICD-10-CM | POA: Diagnosis not present

## 2022-08-29 MED ORDER — PREDNISONE 20 MG PO TABS: 40.0000 mg | ORAL_TABLET | Freq: Every day | ORAL | 0 refills | Status: AC

## 2022-08-29 NOTE — ED Triage Notes (Signed)
Patient here today with c/o left ear pain X 2 days. She has Tried taking Benadryl and Oxycodone with no relief.
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
## 2022-08-29 NOTE — Discharge Instructions (Addendum)
Your ear appears normal with no evidence of infection.  Start prednisone 40 mg for 4 days.  Do not take NSAIDs with this medication including aspirin, ibuprofen/Advil, naproxen/Aleve.  You can use Tylenol as well as your previously prescribed oxycodone for pain relief.  If your symptoms are improving quickly please follow-up with ENT; call to schedule an appointment.  If anything worsens and you have increasing pain, difficulty opening your mouth, fever, severe headache, vision change you need to be seen immediately.

## 2022-10-04 DIAGNOSIS — F17210 Nicotine dependence, cigarettes, uncomplicated: Secondary | ICD-10-CM | POA: Diagnosis not present

## 2022-10-04 DIAGNOSIS — E669 Obesity, unspecified: Secondary | ICD-10-CM | POA: Diagnosis not present

## 2022-10-04 DIAGNOSIS — N915 Oligomenorrhea, unspecified: Secondary | ICD-10-CM | POA: Diagnosis not present

## 2022-10-04 DIAGNOSIS — G629 Polyneuropathy, unspecified: Secondary | ICD-10-CM | POA: Diagnosis not present

## 2022-10-04 DIAGNOSIS — Z79899 Other long term (current) drug therapy: Secondary | ICD-10-CM | POA: Diagnosis not present

## 2022-10-04 DIAGNOSIS — M62838 Other muscle spasm: Secondary | ICD-10-CM | POA: Diagnosis not present

## 2022-10-04 DIAGNOSIS — E559 Vitamin D deficiency, unspecified: Secondary | ICD-10-CM | POA: Diagnosis not present

## 2022-10-04 DIAGNOSIS — M79605 Pain in left leg: Secondary | ICD-10-CM | POA: Diagnosis not present

## 2022-10-06 DIAGNOSIS — Z79899 Other long term (current) drug therapy: Secondary | ICD-10-CM | POA: Diagnosis not present

## 2022-11-25 ENCOUNTER — Encounter (HOSPITAL_COMMUNITY): Payer: Self-pay | Admitting: Emergency Medicine

## 2022-11-25 ENCOUNTER — Emergency Department (HOSPITAL_COMMUNITY)
Admission: EM | Admit: 2022-11-25 | Discharge: 2022-11-25 | Disposition: A | Discharge status: Home / Self Care | Payer: No Typology Code available for payment source | Attending: Emergency Medicine | Admitting: Emergency Medicine

## 2022-11-25 ENCOUNTER — Emergency Department (HOSPITAL_COMMUNITY): Payer: No Typology Code available for payment source

## 2022-11-25 DIAGNOSIS — Y92410 Unspecified street and highway as the place of occurrence of the external cause: Secondary | ICD-10-CM | POA: Insufficient documentation

## 2022-11-25 DIAGNOSIS — M25562 Pain in left knee: Secondary | ICD-10-CM | POA: Diagnosis not present

## 2022-11-25 DIAGNOSIS — M545 Low back pain, unspecified: Secondary | ICD-10-CM | POA: Diagnosis not present

## 2022-11-25 DIAGNOSIS — S82402A Unspecified fracture of shaft of left fibula, initial encounter for closed fracture: Secondary | ICD-10-CM | POA: Diagnosis not present

## 2022-11-25 LAB — HCG, QUANTITATIVE, PREGNANCY: hCG, Beta Chain, Quant, S: <1 m[IU]/mL (ref <5)

## 2022-11-25 MED ORDER — NAPROXEN 500 MG PO TABS: 500.0000 mg | ORAL_TABLET | Freq: Two times a day (BID) | ORAL | 0 refills | Status: DC

## 2022-11-25 MED ORDER — LIDOCAINE 5 % EX PTCH: 1.0000 | MEDICATED_PATCH | CUTANEOUS | 0 refills | Status: AC

## 2022-11-25 MED ORDER — METHOCARBAMOL 500 MG PO TABS
500.0000 mg | ORAL_TABLET | Freq: Two times a day (BID) | ORAL | 0 refills | Status: DC
Start: 2022-11-25 — End: 2023-02-20

## 2022-11-25 MED ORDER — HYDROCODONE-ACETAMINOPHEN 5-325 MG PO TABS
1.0000 | ORAL_TABLET | Freq: Once | ORAL | Status: AC
  Administered 2022-11-25: 1 via ORAL
  Filled 2022-11-25: qty 1
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
## 2022-12-05 DIAGNOSIS — Z6836 Body mass index (BMI) 36.0-36.9, adult: Secondary | ICD-10-CM | POA: Diagnosis not present

## 2022-12-05 DIAGNOSIS — G629 Polyneuropathy, unspecified: Secondary | ICD-10-CM | POA: Diagnosis not present

## 2022-12-05 DIAGNOSIS — R030 Elevated blood-pressure reading, without diagnosis of hypertension: Secondary | ICD-10-CM | POA: Diagnosis not present

## 2022-12-05 DIAGNOSIS — F17210 Nicotine dependence, cigarettes, uncomplicated: Secondary | ICD-10-CM | POA: Diagnosis not present

## 2022-12-05 DIAGNOSIS — Z79899 Other long term (current) drug therapy: Secondary | ICD-10-CM | POA: Diagnosis not present

## 2022-12-05 DIAGNOSIS — N915 Oligomenorrhea, unspecified: Secondary | ICD-10-CM | POA: Diagnosis not present

## 2022-12-05 DIAGNOSIS — M62838 Other muscle spasm: Secondary | ICD-10-CM | POA: Diagnosis not present

## 2022-12-05 DIAGNOSIS — M79605 Pain in left leg: Secondary | ICD-10-CM | POA: Diagnosis not present

## 2022-12-08 DIAGNOSIS — Z79899 Other long term (current) drug therapy: Secondary | ICD-10-CM | POA: Diagnosis not present

## 2023-01-04 DIAGNOSIS — Z79899 Other long term (current) drug therapy: Secondary | ICD-10-CM | POA: Diagnosis not present

## 2023-01-04 DIAGNOSIS — G629 Polyneuropathy, unspecified: Secondary | ICD-10-CM | POA: Diagnosis not present

## 2023-01-04 DIAGNOSIS — F17210 Nicotine dependence, cigarettes, uncomplicated: Secondary | ICD-10-CM | POA: Diagnosis not present

## 2023-01-04 DIAGNOSIS — E669 Obesity, unspecified: Secondary | ICD-10-CM | POA: Diagnosis not present

## 2023-01-04 DIAGNOSIS — N915 Oligomenorrhea, unspecified: Secondary | ICD-10-CM | POA: Diagnosis not present

## 2023-01-04 DIAGNOSIS — E559 Vitamin D deficiency, unspecified: Secondary | ICD-10-CM | POA: Diagnosis not present

## 2023-01-04 DIAGNOSIS — M79605 Pain in left leg: Secondary | ICD-10-CM | POA: Diagnosis not present

## 2023-01-04 DIAGNOSIS — M62838 Other muscle spasm: Secondary | ICD-10-CM | POA: Diagnosis not present

## 2023-01-06 DIAGNOSIS — Z79899 Other long term (current) drug therapy: Secondary | ICD-10-CM | POA: Diagnosis not present

## 2023-02-15 ENCOUNTER — Other Ambulatory Visit: Payer: Self-pay

## 2023-02-15 ENCOUNTER — Ambulatory Visit (HOSPITAL_COMMUNITY)
Admission: EM | Admit: 2023-02-15 | Discharge: 2023-02-15 | Disposition: A | Discharge status: Home / Self Care | Payer: Medicaid Other

## 2023-02-15 ENCOUNTER — Ambulatory Visit
Admission: EM | Admit: 2023-02-15 | Discharge: 2023-02-15 | Disposition: A | Discharge status: Home / Self Care | Payer: Medicaid Other | Attending: Family Medicine | Admitting: Family Medicine

## 2023-02-15 DIAGNOSIS — B349 Viral infection, unspecified: Secondary | ICD-10-CM

## 2023-02-15 DIAGNOSIS — Z3201 Encounter for pregnancy test, result positive: Secondary | ICD-10-CM

## 2023-02-15 LAB — POC COVID19/FLU A&B COMBO
Covid Antigen, POC: NEGATIVE
Influenza A Antigen, POC: NEGATIVE
Influenza B Antigen, POC: NEGATIVE

## 2023-02-15 LAB — POCT URINE PREGNANCY: Preg Test, Ur: POSITIVE — AB

## 2023-02-15 MED ORDER — ACETAMINOPHEN 325 MG PO TABS: 650.0000 mg | ORAL_TABLET | Freq: Four times a day (QID) | ORAL | 0 refills | Status: AC | PRN

## 2023-02-15 MED ORDER — FLUTICASONE PROPIONATE 50 MCG/ACT NA SUSP: 2.0000 | Freq: Every day | NASAL | 12 refills | Status: AC

## 2023-02-15 MED ORDER — CETIRIZINE HCL 10 MG PO TABS: 10.0000 mg | ORAL_TABLET | Freq: Every day | ORAL | 0 refills | Status: AC

## 2023-02-15 NOTE — Discharge Instructions (Signed)
We will manage this as a viral syndrome, a viral infection. For sore throat or cough try using a honey-based tea. Use 3 teaspoons of honey with juice squeezed from half lemon. Place shaved pieces of ginger into 1/2-1 cup of water and warm over stove top. Then mix the ingredients and repeat every 4 hours as needed. Please take Tylenol 500mg -650mg  once every 6 hours for fevers, aches and pains. Hydrate very well with at least 2 liters (64 ounces) of water. Eat light meals such as soups (chicken and noodles, chicken wild rice, vegetable).  Do not eat any foods that you are allergic to.  Start an antihistamine like Zyrtec (10mg  daily) and Flonase for postnasal drainage, sinus congestion.  You can take this together with non-alcohol cough drops.   Start a prenatal vitamin. Follow up with an OB/GYN as possible to establish care for your pregnancy.

## 2023-02-15 NOTE — ED Triage Notes (Signed)
Patient C/O sore throat, cough and congestion for two days. Patient states she may be pregnant.

## 2023-02-15 NOTE — ED Provider Notes (Signed)
[facility]  Note:  This document was prepared using Conservation officer, historic buildings and may include unintentional dictation errors.  MRN: [id number] DOB: 1990/10/18  Subjective:   [name] is a 33 y.o. female presenting for 2-day history of sinus congestion, throat pain, coughing.  Patient has had some intermittent vomiting.  Would like a pregnancy test.  No chest pain, shortness of breath or wheezing.  No history of asthma.  LMP was December 30, 2022.  No current facility-administered medications for this encounter.  Current Outpatient Medications:    lidocaine (LIDODERM) 5 %, Place 1 patch onto the skin daily. Remove & Discard patch within 12 hours or as directed by MD, Disp: 30 patch, Rfl: 0   methocarbamol (ROBAXIN) 500 MG tablet, Take 1 tablet (500 mg total) by mouth 2 (two) times daily., Disp: 20 tablet, Rfl: 0   naproxen (NAPROSYN) 500 MG tablet, Take 1 tablet (500 mg total) by mouth 2 (two) times daily., Disp: 30 tablet, Rfl: 0   oxyCODONE (OXY IR/ROXICODONE) 5 MG immediate release tablet, Take 5 mg by mouth 3 (three) times daily as needed for severe pain., Disp: , Rfl:    Vitamin D, Ergocalciferol, (DRISDOL) 1.25 MG (50000 UNIT) CAPS capsule, Take 50,000 Units by mouth once a week., Disp: , Rfl:    Allergies  Allergen Reactions   Mushroom Extract Complex (Do Not Select) Hives    Past Medical History:  Diagnosis Date   Abdominal pain affecting pregnancy 10/02/2017   Abnormal glucose tolerance test (GTT) during pregnancy, antepartum 09/13/2017   Results for ANALIS, YELINEK (MRN [id number]) as of 09/13/2017 15:11  Ref. Range 08/16/2017 10:22 Glucose, 1 hour Latest Ref Range: 65 - 179 mg/dL 086 Glucose, Fasting Latest Ref Range: 65 - 91 mg/dL 92 (H) Glucose, 2 hour Latest Ref Range: 65 - 152 mg/dL 83    Abnormal hemoglobin (HCC) 05/02/2017   [facility] Arly.Keller ] heme referral [X]  genetics referral Arly.Keller ] baseline CBC, CMP, urinalysis, UPC/24-hr urine protein Arly.Keller ] folic  acid 5 mg/day    Adjustment disorder with depressed mood    Antepartum low grade squamous intraepithelial cervical dysplasia complicating pregnancy    Cervical transverse process fracture (HCC)    Complication of anesthesia    Gestational diabetes 10/19/2017   Lumbar transverse process fracture (HCC)    No pertinent past medical history    Pelvic fracture (HCC)    PTSD (post-traumatic stress disorder)    Sickle cell trait (HCC)    Supervision of normal first pregnancy, antepartum 04/26/2017   .Marland Kitchen Nursing Staff Provider Office Location [facility] Dating  U/S Language  English Anatomy US  Normal anatomy; female fetus  F/u growth ordered  Flu Vaccine  Declined 09/27/17 Genetic Screen  NIPS: Panaroma:  Neg  AFP:   First Screen:  Quad:   TDaP vaccine   Declined 08-30-17 Hgb A1C or  GTT Early  Third trimester normal Rhogam  N/a A+   LAB RESULTS  Feeding Plan Breast Blood Type A/Positive/-- (04/03   SVD (spontaneous vaginal delivery) 11/06/2017   TBI (traumatic brain injury) [facility])      Past Surgical History:  Procedure Laterality Date   APPLICATION OF WOUND VAC Left 06/30/2015   Procedure: APPLICATION OF WOUND VAC ;  Surgeon: Myrene Galas, MD;  Location: [facility] OR;  Service: Orthopedics;  Laterality: Left;  L ankle and L calf   CLOSED REDUCTION NASAL FRACTURE N/A 06/28/2015   Procedure: CLOSED REDUCTION NASoSepto FRACTURE;  Surgeon: Newman Pies, MD;  Location: [facility] OR;  Service: ENT;  Laterality: N/A;   ESOPHAGOSCOPY N/A 06/28/2015   Procedure: ESOPHAGOSCOPY;  Surgeon: Newman Pies, MD;  Location: [facility] OR;  Service: ENT;  Laterality: N/A;   EXTERNAL FIXATION LEG Left 06/28/2015   Procedure: EXTERNAL FIXATION LEG;  Surgeon: Samson Frederic, MD;  Location: [facility] OR;  Service: Orthopedics;  Laterality: Left;   EXTERNAL FIXATION LEG Left 07/03/2015   Procedure: REMOVAL OF EXTERNAL FIXATION LEFT LEG;  Surgeon: Myrene Galas, MD;  Location: [facility] OR;  Service: Orthopedics;  Laterality: Left;   FEMUR IM NAIL Left 07/03/2015   Procedure:  INTRAMEDULLARY (IM) NAIL FEMORAL;  Surgeon: Myrene Galas, MD;  Location: [facility] OR;  Service: Orthopedics;  Laterality: Left;   I & D EXTREMITY Left 06/28/2015   Procedure: IRRIGATION AND DEBRIDEMENT EXTREMITY;  Surgeon: Samson Frederic, MD;  Location: [facility] OR;  Service: Orthopedics;  Laterality: Left;   I & D EXTREMITY Left 06/30/2015   Procedure: IRRIGATION AND DEBRIDEMENT 3A TIBIA PROXIMAL PLATEAU AND SHAFT WITH  EXTERNAL FIXATOR ADJUSTMENT;  Surgeon: Myrene Galas, MD;  Location: [facility] OR;  Service: Orthopedics;  Laterality: Left;   I & D EXTREMITY Left 07/03/2015   Procedure: IRRIGATION AND DEBRIDEMENT LEFT OPEN TIBIA;  Surgeon: Myrene Galas, MD;  Location: [facility] OR;  Service: Orthopedics;  Laterality: Left;   IRRIGATION AND DEBRIDEMENT FOOT Left 06/30/2015   Procedure: IRRIGATION AND DEBRIDEMENT OPEN 3A LEFT ANKLE JOINT DISLOCATION AND OPEN LEFT BIMALLEOUS FRACTURE ;  Surgeon: Myrene Galas, MD;  Location: [facility] OR;  Service: Orthopedics;  Laterality: Left;   LARYNGOSCOPY AND BRONCHOSCOPY N/A 06/28/2015   Procedure: LARYNGOSCOPY AND BRONCHOSCOPY;  Surgeon: Newman Pies, MD;  Location: [facility] OR;  Service: ENT;  Laterality: N/A;   ORIF ACETABULAR FRACTURE N/A 07/03/2015   Procedure: OPEN REDUCTION INTERNAL FIXATION (ORIF) ACETABULAR FRACTURE;  Surgeon: Myrene Galas, MD;  Location: [facility] OR;  Service: Orthopedics;  Laterality: N/A;   ORIF ANKLE FRACTURE Left 06/30/2015   Procedure: OPEN REDUCTION INTERNAL FIXATION (ORIF) ANTERIOR COLUMN ACETABULUM;  Surgeon: Myrene Galas, MD;  Location: [facility] OR;  Service: Orthopedics;  Laterality: Left;   ORIF PELVIC FRACTURE Bilateral 06/30/2015   Procedure:  TRANS-SACRAL SCREWS ;  Surgeon: Myrene Galas, MD;  Location: [facility] OR;  Service: Orthopedics;  Laterality: Bilateral;   ORIF TIBIA PLATEAU Left 07/03/2015   Procedure: OPEN REDUCTION INTERNAL FIXATION (ORIF) TIBIAL PLATEAU;  Surgeon: Myrene Galas, MD;  Location: [facility] OR;  Service: Orthopedics;  Laterality: Left;    Family History  Problem Relation  Age of Onset   ADD / ADHD Neg Hx    Alcohol abuse Neg Hx    Arthritis Neg Hx    Anxiety disorder Neg Hx    Asthma Neg Hx    Birth defects Neg Hx    Cancer Neg Hx    COPD Neg Hx    Depression Neg Hx    Diabetes Neg Hx    Drug abuse Neg Hx    Early death Neg Hx    Hearing loss Neg Hx    Heart disease Neg Hx    Hyperlipidemia Neg Hx    Hypertension Neg Hx    Intellectual disability Neg Hx    Kidney disease Neg Hx    Miscarriages / Stillbirths Neg Hx    Learning disabilities Neg Hx    Obesity Neg Hx    Vision loss Neg Hx    Varicose Veins Neg Hx    Stroke Neg Hx     Social History   Tobacco Use  Smoking status: Former    Current packs/day: 0.00    Types: Cigarettes    Quit date: 02/2017    Years since quitting: 5.9   Smokeless tobacco: Never  Substance Use Topics   Alcohol use: No    Alcohol/week: 0.0 standard drinks of alcohol   Drug use: No    ROS   Objective:   Vitals: BP 131/71 (BP Location: Left Arm)   Pulse 95   Temp 98.1 F (36.7 C) (Oral)   LMP 12/30/2022 (Approximate)   SpO2 95%   Physical Exam Constitutional:      General: She is not in acute distress.    Appearance: Normal appearance. She is well-developed and normal weight. She is not ill-appearing, toxic-appearing or diaphoretic.  HENT:     Head: Normocephalic and atraumatic.     Right Ear: Tympanic membrane, ear canal and external ear normal. No drainage or tenderness. No middle ear effusion. There is no impacted cerumen. Tympanic membrane is not erythematous or bulging.     Left Ear: Tympanic membrane, ear canal and external ear normal. No drainage or tenderness.  No middle ear effusion. There is no impacted cerumen. Tympanic membrane is not erythematous or bulging.     Nose: Congestion present. No rhinorrhea.     Mouth/Throat:     Mouth: Mucous membranes are moist. No oral lesions.     Pharynx: No pharyngeal swelling, oropharyngeal exudate, posterior oropharyngeal erythema or uvula  swelling.     Tonsils: No tonsillar exudate or tonsillar abscesses.     Comments: Slight postnasal drainage overlying pharynx. Eyes:     General: No scleral icterus.       Right eye: No discharge.        Left eye: No discharge.     Extraocular Movements: Extraocular movements intact.     Right eye: Normal extraocular motion.     Left eye: Normal extraocular motion.     Conjunctiva/sclera: Conjunctivae normal.  Cardiovascular:     Rate and Rhythm: Normal rate and regular rhythm.     Heart sounds: Normal heart sounds. No murmur heard.    No friction rub. No gallop.  Pulmonary:     Effort: Pulmonary effort is normal. No respiratory distress.     Breath sounds: No stridor. No wheezing, rhonchi or rales.  Chest:     Chest wall: No tenderness.  Musculoskeletal:     Cervical back: Normal range of motion and neck supple.  Lymphadenopathy:     Cervical: No cervical adenopathy.  Skin:    General: Skin is warm and dry.  Neurological:     General: No focal deficit present.     Mental Status: She is alert and oriented to person, place, and time.  Psychiatric:        Mood and Affect: Mood normal.        Behavior: Behavior normal.    Results for orders placed or performed during the hospital encounter of 02/15/23 (from the past 24 hours)  POC Covid19/Flu A&B Antigen     Status: Normal   Collection Time: 02/15/23  3:20 PM  Result Value Ref Range   Influenza A Antigen, POC Negative    Influenza B Antigen, POC Negative    Covid Antigen, POC Negative   POCT urine pregnancy     Status: Abnormal   Collection Time: 02/15/23  3:33 PM  Result Value Ref Range   Preg Test, Ur Positive (A)     Assessment and Plan :  PDMP not reviewed this encounter.  1. Acute viral syndrome   2. Positive pregnancy test    Suspect viral URI, viral syndrome. Physical exam findings reassuring and vital signs stable for discharge. Advised supportive care, offered symptomatic relief.  Anticipatory guidance  provided to patient regarding prenatal care. She is to start a prenatal vitamin and counseled on avoiding alcohol, cigarettes, drug use, caffeine.  Also counseled on medications that are safe in pregnancy over-the-counter. Counseled patient on potential for adverse effects with medications prescribed/recommended today, ER and return-to-clinic precautions discussed, patient verbalized understanding.    Wallis Bamberg, [place] 02/15/23 1546

## 2023-02-19 ENCOUNTER — Inpatient Hospital Stay (HOSPITAL_COMMUNITY)
Admission: AD | Admit: 2023-02-19 | Discharge: 2023-02-20 | Disposition: A | Discharge status: Home / Self Care | Payer: Medicaid Other | Attending: Obstetrics & Gynecology | Admitting: Obstetrics & Gynecology

## 2023-02-19 ENCOUNTER — Inpatient Hospital Stay (HOSPITAL_COMMUNITY): Payer: Medicaid Other

## 2023-02-19 ENCOUNTER — Other Ambulatory Visit: Payer: Self-pay

## 2023-02-19 ENCOUNTER — Encounter (HOSPITAL_COMMUNITY): Payer: Self-pay

## 2023-02-19 DIAGNOSIS — O98311 Other infections with a predominantly sexual mode of transmission complicating pregnancy, first trimester: Secondary | ICD-10-CM | POA: Insufficient documentation

## 2023-02-19 DIAGNOSIS — N83292 Other ovarian cyst, left side: Secondary | ICD-10-CM | POA: Diagnosis not present

## 2023-02-19 DIAGNOSIS — O23591 Infection of other part of genital tract in pregnancy, first trimester: Secondary | ICD-10-CM | POA: Insufficient documentation

## 2023-02-19 DIAGNOSIS — O26891 Other specified pregnancy related conditions, first trimester: Secondary | ICD-10-CM | POA: Diagnosis not present

## 2023-02-19 DIAGNOSIS — O209 Hemorrhage in early pregnancy, unspecified: Secondary | ICD-10-CM | POA: Diagnosis not present

## 2023-02-19 DIAGNOSIS — A5901 Trichomonal vulvovaginitis: Secondary | ICD-10-CM | POA: Insufficient documentation

## 2023-02-19 DIAGNOSIS — Z3A01 Less than 8 weeks gestation of pregnancy: Secondary | ICD-10-CM | POA: Diagnosis not present

## 2023-02-19 DIAGNOSIS — M549 Dorsalgia, unspecified: Secondary | ICD-10-CM | POA: Diagnosis not present

## 2023-02-19 DIAGNOSIS — O26899 Other specified pregnancy related conditions, unspecified trimester: Secondary | ICD-10-CM

## 2023-02-19 DIAGNOSIS — O3481 Maternal care for other abnormalities of pelvic organs, first trimester: Secondary | ICD-10-CM | POA: Diagnosis not present

## 2023-02-19 DIAGNOSIS — O200 Threatened abortion: Secondary | ICD-10-CM | POA: Diagnosis not present

## 2023-02-19 LAB — URINALYSIS, ROUTINE W REFLEX MICROSCOPIC
Bilirubin Urine: NEGATIVE
Glucose, UA: NEGATIVE mg/dL
Ketones, ur: NEGATIVE mg/dL
Nitrite: NEGATIVE
Protein, ur: 100 mg/dL — AB
RBC / HPF: >50 RBC/hpf (ref 0–5)
Specific Gravity, Urine: 1.015 (ref 1.005–1.030)
WBC, UA: >50 WBC/hpf (ref 0–5)
pH: 5 (ref 5.0–8.0)

## 2023-02-19 LAB — CBC
HCT: 26.4 % — ABNORMAL LOW (ref 36.0–46.0)
Hemoglobin: 9.5 g/dL — ABNORMAL LOW (ref 12.0–15.0)
MCH: 28.8 pg (ref 26.0–34.0)
MCHC: 36 g/dL (ref 30.0–36.0)
MCV: 80 fL (ref 80.0–100.0)
Platelets: 372 10*3/uL (ref 150–400)
RBC: 3.3 MIL/uL — ABNORMAL LOW (ref 3.87–5.11)
RDW: 16.4 % — ABNORMAL HIGH (ref 11.5–15.5)
WBC: 9.7 10*3/uL (ref 4.0–10.5)
nRBC: 0.5 % — ABNORMAL HIGH (ref 0.0–0.2)

## 2023-02-19 LAB — WET PREP, GENITAL
Clue Cells Wet Prep HPF POC: NONE SEEN
Sperm: NONE SEEN
WBC, Wet Prep HPF POC: <10 (ref <10)
Yeast Wet Prep HPF POC: NONE SEEN

## 2023-02-19 LAB — HCG, QUANTITATIVE, PREGNANCY: hCG, Beta Chain, Quant, S: 622 m[IU]/mL — ABNORMAL HIGH (ref <5)

## 2023-02-19 LAB — HIV ANTIBODY (ROUTINE TESTING W REFLEX): HIV Screen 4th Generation wRfx: NONREACTIVE

## 2023-02-19 MED ORDER — IBUPROFEN 600 MG PO TABS: 600.0000 mg | ORAL_TABLET | Freq: Four times a day (QID) | ORAL | 0 refills | Status: AC | PRN

## 2023-02-19 MED ORDER — METRONIDAZOLE 500 MG PO TABS: 500.0000 mg | ORAL_TABLET | Freq: Two times a day (BID) | ORAL | 0 refills | Status: DC

## 2023-02-19 NOTE — Discharge Instructions (Signed)
Return to MAU: If you have heavier bleeding that soaks through more that 2 pads per hour for an hour or more If you bleed so much that you feel like you might pass out or you do pass out If you have significant abdominal pain that is not improved with Tylenol 1000 mg every 8 hours as needed for pain If you develop a fever > 100.5

## 2023-02-19 NOTE — MAU Provider Note (Signed)
History     CSN: [id number]  Arrival date and time: 02/19/23 2114   Event Date/Time   First Provider Initiated Contact with Patient 02/19/23 2157      Chief Complaint  Patient presents with   Vaginal Bleeding   Abdominal Pain   Back Pain   HPI [name] is a 33 y.o. year old [id number] female at [redacted]w[redacted]d weeks gestation who presents to MAU reporting she was seen at Urgent Care on Wednesday 02/15/2023. She requested a UPT while at Urgent Care, because of spotting and LMP was 12/30/2022. UPT was positive there. She reports her VB increased on Thursday. She is having to wear Depends for the bleeding. She reports she has been passing blood clots. She reports lower abdominal cramping (7/10), intermittent back pain (rated 10/10).   OB History     Gravida  3   Para  1   Term  1   Preterm  0   AB  1   Living  1      SAB  0   IAB  0   Ectopic  0   Multiple  0   Live Births  1           Past Medical History:  Diagnosis Date   Abdominal pain affecting pregnancy 10/02/2017   Abnormal glucose tolerance test (GTT) during pregnancy, antepartum 09/13/2017   Results for Elizabeth, Dyer (MRN [id number]) as of 09/13/2017 15:11  Ref. Range 08/16/2017 10:22 Glucose, 1 hour Latest Ref Range: 65 - 179 mg/dL 440 Glucose, Fasting Latest Ref Range: 65 - 91 mg/dL 92 (H) Glucose, 2 hour Latest Ref Range: 65 - 152 mg/dL 83    Abnormal hemoglobin (HCC) 05/02/2017   [facility]BS-C Arly.Keller ] heme referral [X]  genetics referral Arly.Keller ] baseline CBC, CMP, urinalysis, UPC/24-hr urine protein Arly.Keller ] folic acid 5 mg/day    Adjustment disorder with depressed mood    Antepartum low grade squamous intraepithelial cervical dysplasia complicating pregnancy    Cervical transverse process fracture (HCC)    Complication of anesthesia    Gestational diabetes 10/19/2017   Lumbar transverse process fracture (HCC)    No pertinent past medical history    Pelvic fracture (HCC)    PTSD (post-traumatic stress disorder)    Sickle  cell trait (HCC)    Supervision of normal first pregnancy, antepartum 04/26/2017   .Marland Kitchen Nursing Staff Provider Office Location [facility] Dating  U/S Language  English Anatomy US  Normal anatomy; female fetus  F/u growth ordered  Flu Vaccine  Declined 09/27/17 Genetic Screen  NIPS: Panaroma:  Neg  AFP:   First Screen:  Quad:   TDaP vaccine   Declined 08-30-17 Hgb A1C or  GTT Early  Third trimester normal Rhogam  N/a A+   LAB RESULTS  Feeding Plan Breast Blood Type A/Positive/-- (04/03   SVD (spontaneous vaginal delivery) 11/06/2017   TBI (traumatic brain injury) [facility])     Past Surgical History:  Procedure Laterality Date   APPLICATION OF WOUND VAC Left 06/30/2015   Procedure: APPLICATION OF WOUND VAC ;  Surgeon: Myrene Galas, MD;  Location: [facility] OR;  Service: Orthopedics;  Laterality: Left;  L ankle and L calf   CLOSED REDUCTION NASAL FRACTURE N/A 06/28/2015   Procedure: CLOSED REDUCTION NASoSepto FRACTURE;  Surgeon: Newman Pies, MD;  Location: [facility] OR;  Service: ENT;  Laterality: N/A;   ESOPHAGOSCOPY N/A 06/28/2015   Procedure: ESOPHAGOSCOPY;  Surgeon: Newman Pies, MD;  Location: [facility] OR;  Service: ENT;  Laterality:  N/A;   EXTERNAL FIXATION LEG Left 06/28/2015   Procedure: EXTERNAL FIXATION LEG;  Surgeon: Samson Frederic, MD;  Location: [facility] OR;  Service: Orthopedics;  Laterality: Left;   EXTERNAL FIXATION LEG Left 07/03/2015   Procedure: REMOVAL OF EXTERNAL FIXATION LEFT LEG;  Surgeon: Myrene Galas, MD;  Location: [facility] OR;  Service: Orthopedics;  Laterality: Left;   FEMUR IM NAIL Left 07/03/2015   Procedure: INTRAMEDULLARY (IM) NAIL FEMORAL;  Surgeon: Myrene Galas, MD;  Location: [facility] OR;  Service: Orthopedics;  Laterality: Left;   I & D EXTREMITY Left 06/28/2015   Procedure: IRRIGATION AND DEBRIDEMENT EXTREMITY;  Surgeon: Samson Frederic, MD;  Location: [facility] OR;  Service: Orthopedics;  Laterality: Left;   I & D EXTREMITY Left 06/30/2015   Procedure: IRRIGATION AND DEBRIDEMENT 3A TIBIA PROXIMAL PLATEAU AND SHAFT WITH  EXTERNAL FIXATOR  ADJUSTMENT;  Surgeon: Myrene Galas, MD;  Location: [facility] OR;  Service: Orthopedics;  Laterality: Left;   I & D EXTREMITY Left 07/03/2015   Procedure: IRRIGATION AND DEBRIDEMENT LEFT OPEN TIBIA;  Surgeon: Myrene Galas, MD;  Location: [facility] OR;  Service: Orthopedics;  Laterality: Left;   IRRIGATION AND DEBRIDEMENT FOOT Left 06/30/2015   Procedure: IRRIGATION AND DEBRIDEMENT OPEN 3A LEFT ANKLE JOINT DISLOCATION AND OPEN LEFT BIMALLEOUS FRACTURE ;  Surgeon: Myrene Galas, MD;  Location: [facility] OR;  Service: Orthopedics;  Laterality: Left;   LARYNGOSCOPY AND BRONCHOSCOPY N/A 06/28/2015   Procedure: LARYNGOSCOPY AND BRONCHOSCOPY;  Surgeon: Newman Pies, MD;  Location: [facility] OR;  Service: ENT;  Laterality: N/A;   ORIF ACETABULAR FRACTURE N/A 07/03/2015   Procedure: OPEN REDUCTION INTERNAL FIXATION (ORIF) ACETABULAR FRACTURE;  Surgeon: Myrene Galas, MD;  Location: [facility] OR;  Service: Orthopedics;  Laterality: N/A;   ORIF ANKLE FRACTURE Left 06/30/2015   Procedure: OPEN REDUCTION INTERNAL FIXATION (ORIF) ANTERIOR COLUMN ACETABULUM;  Surgeon: Myrene Galas, MD;  Location: [facility] OR;  Service: Orthopedics;  Laterality: Left;   ORIF PELVIC FRACTURE Bilateral 06/30/2015   Procedure:  TRANS-SACRAL SCREWS ;  Surgeon: Myrene Galas, MD;  Location: [facility] OR;  Service: Orthopedics;  Laterality: Bilateral;   ORIF TIBIA PLATEAU Left 07/03/2015   Procedure: OPEN REDUCTION INTERNAL FIXATION (ORIF) TIBIAL PLATEAU;  Surgeon: Myrene Galas, MD;  Location: [facility] OR;  Service: Orthopedics;  Laterality: Left;    Family History  Problem Relation Age of Onset   ADD / ADHD Neg Hx    Alcohol abuse Neg Hx    Arthritis Neg Hx    Anxiety disorder Neg Hx    Asthma Neg Hx    Birth defects Neg Hx    Cancer Neg Hx    COPD Neg Hx    Depression Neg Hx    Diabetes Neg Hx    Drug abuse Neg Hx    Early death Neg Hx    Hearing loss Neg Hx    Heart disease Neg Hx    Hyperlipidemia Neg Hx    Hypertension Neg Hx    Intellectual disability Neg Hx    Kidney disease Neg  Hx    Miscarriages / Stillbirths Neg Hx    Learning disabilities Neg Hx    Obesity Neg Hx    Vision loss Neg Hx    Varicose Veins Neg Hx    Stroke Neg Hx     Social History   Tobacco Use   Smoking status: Former    Current packs/day: 0.00    Types: Cigarettes    Quit date: 02/2017    Years since quitting: 5.9   Smokeless  tobacco: Never  Substance Use Topics   Alcohol use: No    Alcohol/week: 0.0 standard drinks of alcohol   Drug use: No    Allergies:  Allergies  Allergen Reactions   Mushroom Extract Complex (Do Not Select) Hives    No medications prior to admission.    Review of Systems  Constitutional: Negative.   HENT: Negative.    Eyes: Negative.   Respiratory: Negative.    Cardiovascular: Negative.   Gastrointestinal: Negative.   Endocrine: Negative.   Genitourinary:  Positive for pelvic pain and vaginal bleeding.  Musculoskeletal:  Positive for back pain.  Skin: Negative.   Allergic/Immunologic: Negative.   Neurological: Negative.   Hematological: Negative.   Psychiatric/Behavioral: Negative.     Physical Exam   Patient Vitals for the past 24 hrs:  BP Temp Temp src Pulse Resp SpO2 Height Weight  02/19/23 2353 (!) 145/69 -- -- (!) 104 -- -- -- --  02/19/23 2130 127/73 98.4 F (36.9 C) Oral 91 19 98 % 5\' 11"  (1.803 m) 109.3 kg     Physical Exam Vitals and nursing note reviewed. Exam conducted with a chaperone present.  Constitutional:      Appearance: Normal appearance. She is obese.  Cardiovascular:     Rate and Rhythm: Tachycardia present.  Pulmonary:     Effort: Pulmonary effort is normal.  Abdominal:     Palpations: Abdomen is soft.  Genitourinary:    General: Normal vulva.     Comments: Pelvic exam: External genitalia normal, SE: vaginal walls pink and well rugated, cervix is smooth, pink, no lesions, small amt of mucoid, red blood in vaginal vault -- WP, GC/CT done earlier by blind swab, cervix visually closed, purulent drainage  visualized draining from cervical os. Bimanual exam deferred.  Musculoskeletal:        General: Normal range of motion.  Skin:    General: Skin is warm and dry.  Neurological:     Mental Status: She is alert and oriented to person, place, and time.  Psychiatric:        Mood and Affect: Mood normal.        Behavior: Behavior normal.        Thought Content: Thought content normal.        Judgment: Judgment normal.    MAU Course  Procedures  MDM CCUA UPT CBC ABO/Rh -- not drawn; known A POS HCG Wet Prep GC/CT -- Results pending  RPR -- Results pending  OB U/S < 14 wks TVUS  Results for orders placed or performed during the hospital encounter of 02/19/23 (from the past 24 hours)  Urinalysis, Routine w reflex microscopic -Urine, Clean Catch     Status: Abnormal   Collection Time: 02/19/23  9:41 PM  Result Value Ref Range   Color, Urine AMBER (A) YELLOW   APPearance CLOUDY (A) CLEAR   Specific Gravity, Urine 1.015 1.005 - 1.030   pH 5.0 5.0 - 8.0   Glucose, UA NEGATIVE NEGATIVE mg/dL   Hgb urine dipstick LARGE (A) NEGATIVE   Bilirubin Urine NEGATIVE NEGATIVE   Ketones, ur NEGATIVE NEGATIVE mg/dL   Protein, ur 161 (A) NEGATIVE mg/dL   Nitrite NEGATIVE NEGATIVE   Leukocytes,Ua LARGE (A) NEGATIVE   RBC / HPF >50 0 - 5 RBC/hpf   WBC, UA >50 0 - 5 WBC/hpf   Bacteria, UA RARE (A) NONE SEEN   Squamous Epithelial / HPF 6-10 0 - 5 /HPF   WBC Clumps PRESENT    Non  Squamous Epithelial 0-5 (A) NONE SEEN  Wet prep, genital     Status: Abnormal   Collection Time: 02/19/23  9:52 PM   Specimen: PATH Cytology Cervicovaginal Ancillary Only  Result Value Ref Range   Yeast Wet Prep HPF POC NONE SEEN NONE SEEN   Trich, Wet Prep PRESENT (A) NONE SEEN   Clue Cells Wet Prep HPF POC NONE SEEN NONE SEEN   WBC, Wet Prep HPF POC <10 <10   Sperm NONE SEEN   CBC     Status: Abnormal   Collection Time: 02/19/23 10:02 PM  Result Value Ref Range   WBC 9.7 4.0 - 10.5 K/uL   RBC 3.30 (L) 3.87 -  5.11 MIL/uL   Hemoglobin 9.5 (L) 12.0 - 15.0 g/dL   HCT 23.5 (L) 57.3 - 22.0 %   MCV 80.0 80.0 - 100.0 fL   MCH 28.8 26.0 - 34.0 pg   MCHC 36.0 30.0 - 36.0 g/dL   RDW 25.4 (H) 27.0 - 62.3 %   Platelets 372 150 - 400 K/uL   nRBC 0.5 (H) 0.0 - 0.2 %  hCG, quantitative, pregnancy     Status: Abnormal   Collection Time: 02/19/23 10:02 PM  Result Value Ref Range   hCG, Beta Chain, Quant, S 622 (H) <5 mIU/mL  HIV Antibody (routine testing w rflx)     Status: None   Collection Time: 02/19/23 10:02 PM  Result Value Ref Range   HIV Screen 4th Generation wRfx Non Reactive Non Reactive    US OB LESS THAN 14 WEEKS WITH OB TRANSVAGINAL Result Date: 02/19/2023 CLINICAL DATA:  Vaginal bleeding with known positive pregnancy test EXAM: OBSTETRIC <14 WK [place] AND TRANSVAGINAL OB US TECHNIQUE: Both transabdominal and transvaginal ultrasound examinations were performed for complete evaluation of the gestation as well as the maternal uterus, adnexal regions, and pelvic cul-de-sac. Transvaginal technique was performed to assess early pregnancy. COMPARISON:  None Available. FINDINGS: Intrauterine gestational sac: Absent Maternal uterus/adnexae: Simple cyst is noted within the left ovary measuring up to 5.4 cm. No findings to suggest ectopic pregnancy are noted at this time. Right ovary is within normal limits. IMPRESSION: No evidence of intrauterine gestation. The beta HCG level is very low at 622 and this likely represents a very early pregnancy. Recommend follow-up quantitative B-HCG levels and follow-up US in 14 days to assess viability. This recommendation follows SRU consensus guidelines: Diagnostic Criteria for Nonviable Pregnancy Early in the First Trimester. Malva Limes Med 2013; 762:8315-17. Simple appearing cyst in the left ovary as described. Electronically Signed   By: Alcide Clever M.D.   On: 02/19/2023 22:54     Assessment and Plan  1. Vaginal bleeding affecting early pregnancy (Primary) - Information  provided on vaginal bleeding in pregnancy 1st trimester  - Return to MAU: If you have heavier bleeding that soaks through more that 2 pads per hour for an hour or more If you bleed so much that you feel like you might pass out or you do pass out If you have significant abdominal pain that is not improved with Tylenol 1000 mg every 8 hours as needed for pain If you develop a fever > 100.5   2. Trichomonal vaginitis during pregnancy in first trimester - Information provided on trichomoniasis and trichomonas test   3. Abdominal cramping affecting pregnancy - Information provided on abdominal pain in pregnancy   4. Threatened miscarriage in early pregnancy - Information provided on threatened miscarriage   5. Back pain affecting pregnancy  in first trimester  6. [redacted] weeks gestation of pregnancy   - Discharge patient - F/U HCG on Wednesday 02/22/2023. Appt scheduled at [facility] at 1300 per pt request - Patient verbalized an understanding of the plan of care and agrees.   Raelyn Mora, CNM 02/19/2023, 9:57 PM

## 2023-02-19 NOTE — MAU Note (Signed)
.  [name] is a 33 y.o. at Unknown here in MAU reporting: was seen at urgent care on Monday - asked for pregnancy test because she was spotting and it was positive. Reports VB increased on Tuesday - now having to use depends for the bleeding. Reports passing clots as well. Also having lower abdominal cramping and back ache that comes and goes.   LMP: 12/30/2022 Pain score: 7 - abdomen; 10 - back  Vitals:   02/19/23 2130  BP: 127/73  Pulse: 91  Resp: 19  Temp: 98.4 F (36.9 C)  SpO2: 98%     FHT: NA   Lab orders placed from triage: UA

## 2023-02-20 ENCOUNTER — Telehealth (HOSPITAL_COMMUNITY): Payer: Self-pay

## 2023-02-20 DIAGNOSIS — Z3A01 Less than 8 weeks gestation of pregnancy: Secondary | ICD-10-CM | POA: Diagnosis not present

## 2023-02-20 DIAGNOSIS — O209 Hemorrhage in early pregnancy, unspecified: Secondary | ICD-10-CM

## 2023-02-20 LAB — GC/CHLAMYDIA PROBE AMP (~~LOC~~) NOT AT ARMC
Chlamydia: POSITIVE — AB
Comment: NEGATIVE
Comment: NORMAL
Neisseria Gonorrhea: NEGATIVE

## 2023-02-20 LAB — RPR: RPR Ser Ql: NONREACTIVE

## 2023-02-20 MED ORDER — AZITHROMYCIN 500 MG PO TABS: 1000.0000 mg | ORAL_TABLET | Freq: Once | ORAL | 0 refills | Status: AC

## 2023-02-22 ENCOUNTER — Telehealth: Payer: Self-pay

## 2023-02-22 ENCOUNTER — Ambulatory Visit (INDEPENDENT_AMBULATORY_CARE_PROVIDER_SITE_OTHER): Payer: Medicaid Other | Admitting: General Practice

## 2023-02-22 VITALS — BP 129/79 | HR 85 | Ht 71.0 in | Wt 243.0 lb

## 2023-02-22 DIAGNOSIS — O209 Hemorrhage in early pregnancy, unspecified: Secondary | ICD-10-CM

## 2023-02-22 DIAGNOSIS — Z3A01 Less than 8 weeks gestation of pregnancy: Secondary | ICD-10-CM

## 2023-02-22 NOTE — Telephone Encounter (Signed)
Consulted with Dr. Debroah Loop in office and informed pt that HCG results are consistent with SAB, follow-up x1 week for HCG, transferred to scheduler.

## 2023-02-22 NOTE — Progress Notes (Signed)
Pt present for STAT HCG. Pt c/o vaginal bleeding and abd cramping.

## 2023-02-23 LAB — BETA HCG QUANT (REF LAB): hCG Quant: 203 m[IU]/mL

## 2023-03-03 ENCOUNTER — Other Ambulatory Visit: Payer: Medicaid Other

## 2023-03-06 ENCOUNTER — Other Ambulatory Visit: Payer: Medicaid Other

## 2023-03-06 DIAGNOSIS — O200 Threatened abortion: Secondary | ICD-10-CM

## 2023-03-08 LAB — BETA HCG QUANT (REF LAB): hCG Quant: 7 m[IU]/mL

## 2023-03-24 DIAGNOSIS — Z131 Encounter for screening for diabetes mellitus: Secondary | ICD-10-CM | POA: Diagnosis not present

## 2023-03-24 DIAGNOSIS — N914 Secondary oligomenorrhea: Secondary | ICD-10-CM | POA: Diagnosis not present

## 2023-03-24 DIAGNOSIS — G629 Polyneuropathy, unspecified: Secondary | ICD-10-CM | POA: Diagnosis not present

## 2023-03-24 DIAGNOSIS — E559 Vitamin D deficiency, unspecified: Secondary | ICD-10-CM | POA: Diagnosis not present

## 2023-03-24 DIAGNOSIS — M79605 Pain in left leg: Secondary | ICD-10-CM | POA: Diagnosis not present

## 2023-03-24 DIAGNOSIS — M129 Arthropathy, unspecified: Secondary | ICD-10-CM | POA: Diagnosis not present

## 2023-03-24 DIAGNOSIS — M62838 Other muscle spasm: Secondary | ICD-10-CM | POA: Diagnosis not present

## 2023-03-24 DIAGNOSIS — Z79899 Other long term (current) drug therapy: Secondary | ICD-10-CM | POA: Diagnosis not present

## 2023-03-24 DIAGNOSIS — E669 Obesity, unspecified: Secondary | ICD-10-CM | POA: Diagnosis not present

## 2023-03-28 DIAGNOSIS — Z79899 Other long term (current) drug therapy: Secondary | ICD-10-CM | POA: Diagnosis not present

## 2023-05-03 DIAGNOSIS — M79605 Pain in left leg: Secondary | ICD-10-CM | POA: Diagnosis not present

## 2023-05-03 DIAGNOSIS — F17210 Nicotine dependence, cigarettes, uncomplicated: Secondary | ICD-10-CM | POA: Diagnosis not present

## 2023-05-03 DIAGNOSIS — G629 Polyneuropathy, unspecified: Secondary | ICD-10-CM | POA: Diagnosis not present

## 2023-05-03 DIAGNOSIS — N914 Secondary oligomenorrhea: Secondary | ICD-10-CM | POA: Diagnosis not present

## 2023-05-03 DIAGNOSIS — Z79899 Other long term (current) drug therapy: Secondary | ICD-10-CM | POA: Diagnosis not present

## 2023-05-03 DIAGNOSIS — E559 Vitamin D deficiency, unspecified: Secondary | ICD-10-CM | POA: Diagnosis not present

## 2023-05-03 DIAGNOSIS — E669 Obesity, unspecified: Secondary | ICD-10-CM | POA: Diagnosis not present

## 2023-05-03 DIAGNOSIS — M62838 Other muscle spasm: Secondary | ICD-10-CM | POA: Diagnosis not present

## 2023-05-05 DIAGNOSIS — Z79899 Other long term (current) drug therapy: Secondary | ICD-10-CM | POA: Diagnosis not present

## 2023-05-08 ENCOUNTER — Ambulatory Visit: Admission: EM | Admit: 2023-05-08 | Discharge: 2023-05-08 | Disposition: A | Discharge status: Home / Self Care

## 2023-05-08 DIAGNOSIS — B349 Viral infection, unspecified: Secondary | ICD-10-CM

## 2023-05-08 NOTE — Discharge Instructions (Addendum)
 You were seen today requesting testing for C. difficile (C. diff). At this time, testing is not recommended because you do not have diarrhea, other related symptoms, or known risk factors for this infection. I have attached information about C. diff for your reference. Please monitor yourself closely for any new or worsening symptoms such as persistent or severe diarrhea, abdominal pain, or fever. If these symptoms develop, follow up with your primary care provider for further evaluation and possible testing.  Your other symptoms is is most likely caused by a viral illness. Viruses can make you feel sick by entering your body's cells, multiplying, and causing those cells to stop working properly or die. As the virus spreads, you start to feel symptoms like a cough, congestion, or body aches. Since your illness is caused by a virus, antibiotics won't help because they only treat infections caused by bacteria. The best way to feel better is to rest and take care of your body. You can use over-the-counter medicines to help manage symptoms like a cough, stuffy nose, or upset stomach. If you have a fever, headache, or muscle aches, taking Tylenol or ibuprofen can help you feel more comfortable. Make sure you're drinking plenty of fluids--enough to keep your urine a light yellow color--to stay well hydrated while your body fights off the virus.

## 2023-05-08 NOTE — ED Triage Notes (Signed)
 

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
## 2023-06-23 ENCOUNTER — Ambulatory Visit
Admission: EM | Admit: 2023-06-23 | Discharge: 2023-06-23 | Disposition: A | Discharge status: Home / Self Care | Attending: Family Medicine | Admitting: Family Medicine

## 2023-06-23 ENCOUNTER — Ambulatory Visit (INDEPENDENT_AMBULATORY_CARE_PROVIDER_SITE_OTHER)

## 2023-06-23 DIAGNOSIS — R051 Acute cough: Secondary | ICD-10-CM

## 2023-06-23 DIAGNOSIS — R059 Cough, unspecified: Secondary | ICD-10-CM | POA: Diagnosis not present

## 2023-06-23 DIAGNOSIS — J209 Acute bronchitis, unspecified: Secondary | ICD-10-CM

## 2023-06-23 MED ORDER — AZITHROMYCIN 250 MG PO TABS: 250.0000 mg | ORAL_TABLET | Freq: Every day | ORAL | 0 refills | Status: AC

## 2023-06-23 MED ORDER — PREDNISONE 20 MG PO TABS: 40.0000 mg | ORAL_TABLET | Freq: Every day | ORAL | 0 refills | Status: AC

## 2023-06-23 MED ORDER — PROMETHAZINE-DM 6.25-15 MG/5ML PO SYRP: 5.0000 mL | ORAL_SOLUTION | Freq: Four times a day (QID) | ORAL | 0 refills | Status: AC | PRN
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
## 2023-06-23 NOTE — Discharge Instructions (Signed)
 Start azithromycin  as prescribed.  Prednisone  daily for 3 days.  You may take Promethazine DM as needed for your cough.  Please note this medication will make you drowsy.  Do not drink alcohol or drive while on this medication.  Lots of rest and fluids.  Please follow-up with your PCP in 2 to 3 days for recheck.  Please go to the ER for any worsening symptoms.  Hope you feel better soon!

## 2023-06-23 NOTE — ED Triage Notes (Signed)
 Pt present coughing with vomiting for three weeks. Pt states she cough so hard that she vomits. Pt states tried otc medication with no relief.

## 2023-06-24 ENCOUNTER — Emergency Department (HOSPITAL_COMMUNITY)
Admission: EM | Admit: 2023-06-24 | Discharge: 2023-06-24 | Disposition: A | Discharge status: Home / Self Care | Attending: Emergency Medicine | Admitting: Emergency Medicine

## 2023-06-24 ENCOUNTER — Other Ambulatory Visit: Payer: Self-pay

## 2023-06-24 ENCOUNTER — Encounter (HOSPITAL_COMMUNITY): Payer: Self-pay

## 2023-06-24 ENCOUNTER — Emergency Department (HOSPITAL_COMMUNITY)

## 2023-06-24 DIAGNOSIS — D72829 Elevated white blood cell count, unspecified: Secondary | ICD-10-CM | POA: Insufficient documentation

## 2023-06-24 DIAGNOSIS — I517 Cardiomegaly: Secondary | ICD-10-CM | POA: Diagnosis not present

## 2023-06-24 DIAGNOSIS — J189 Pneumonia, unspecified organism: Secondary | ICD-10-CM

## 2023-06-24 DIAGNOSIS — R519 Headache, unspecified: Secondary | ICD-10-CM | POA: Diagnosis not present

## 2023-06-24 DIAGNOSIS — R918 Other nonspecific abnormal finding of lung field: Secondary | ICD-10-CM | POA: Diagnosis not present

## 2023-06-24 DIAGNOSIS — R059 Cough, unspecified: Secondary | ICD-10-CM | POA: Diagnosis present

## 2023-06-24 DIAGNOSIS — J181 Lobar pneumonia, unspecified organism: Secondary | ICD-10-CM | POA: Insufficient documentation

## 2023-06-24 LAB — CBC WITH DIFFERENTIAL/PLATELET
Abs Immature Granulocytes: 0.16 10*3/uL — ABNORMAL HIGH (ref 0.00–0.07)
Basophils Absolute: 0 10*3/uL (ref 0.0–0.1)
Basophils Relative: 0 %
Eosinophils Absolute: 0 10*3/uL (ref 0.0–0.5)
Eosinophils Relative: 0 %
HCT: 29.9 % — ABNORMAL LOW (ref 36.0–46.0)
Hemoglobin: 10.9 g/dL — ABNORMAL LOW (ref 12.0–15.0)
Immature Granulocytes: 1 %
Lymphocytes Relative: 10 %
Lymphs Abs: 2.5 10*3/uL (ref 0.7–4.0)
MCH: 29.8 pg (ref 26.0–34.0)
MCHC: 36.5 g/dL — ABNORMAL HIGH (ref 30.0–36.0)
MCV: 81.7 fL (ref 80.0–100.0)
Monocytes Absolute: 1.4 10*3/uL — ABNORMAL HIGH (ref 0.1–1.0)
Monocytes Relative: 6 %
Neutro Abs: 20 10*3/uL — ABNORMAL HIGH (ref 1.7–7.7)
Neutrophils Relative %: 83 %
Platelets: 476 10*3/uL — ABNORMAL HIGH (ref 150–400)
RBC: 3.66 MIL/uL — ABNORMAL LOW (ref 3.87–5.11)
RDW: 16.6 % — ABNORMAL HIGH (ref 11.5–15.5)
WBC: 24.1 10*3/uL — ABNORMAL HIGH (ref 4.0–10.5)
nRBC: 0.3 % — ABNORMAL HIGH (ref 0.0–0.2)

## 2023-06-24 LAB — BASIC METABOLIC PANEL WITH GFR
Anion gap: 9 (ref 5–15)
BUN: 6 mg/dL (ref 6–20)
CO2: 19 mmol/L — ABNORMAL LOW (ref 22–32)
Calcium: 9.1 mg/dL (ref 8.9–10.3)
Chloride: 108 mmol/L (ref 98–111)
Creatinine, Ser: 0.81 mg/dL (ref 0.44–1.00)
GFR, Estimated: >60 mL/min (ref >60)
Glucose, Bld: 200 mg/dL — ABNORMAL HIGH (ref 70–99)
Potassium: 3.9 mmol/L (ref 3.5–5.1)
Sodium: 136 mmol/L (ref 135–145)

## 2023-06-24 LAB — CBC
HCT: 29.8 % — ABNORMAL LOW (ref 36.0–46.0)
Hemoglobin: 10.7 g/dL — ABNORMAL LOW (ref 12.0–15.0)
MCH: 29 pg (ref 26.0–34.0)
MCHC: 35.9 g/dL (ref 30.0–36.0)
MCV: 80.8 fL (ref 80.0–100.0)
Platelets: 462 10*3/uL — ABNORMAL HIGH (ref 150–400)
RBC: 3.69 MIL/uL — ABNORMAL LOW (ref 3.87–5.11)
RDW: 16.7 % — ABNORMAL HIGH (ref 11.5–15.5)
WBC: 23.1 10*3/uL — ABNORMAL HIGH (ref 4.0–10.5)
nRBC: 0.5 % — ABNORMAL HIGH (ref 0.0–0.2)

## 2023-06-24 LAB — LACTIC ACID, PLASMA: Lactic Acid, Venous: 1.4 mmol/L (ref 0.5–1.9)

## 2023-06-24 LAB — TROPONIN I (HIGH SENSITIVITY): Troponin I (High Sensitivity): 13 ng/L (ref <18)

## 2023-06-24 LAB — HCG, SERUM, QUALITATIVE: Preg, Serum: NEGATIVE

## 2023-06-24 MED ORDER — IOHEXOL 350 MG/ML SOLN
75.0000 mL | Freq: Once | INTRAVENOUS | Status: AC | PRN
  Administered 2023-06-24: 75 mL via INTRAVENOUS

## 2023-06-24 MED ORDER — KETOROLAC TROMETHAMINE 15 MG/ML IJ SOLN
15.0000 mg | Freq: Once | INTRAMUSCULAR | Status: AC
  Administered 2023-06-24: 15 mg via INTRAVENOUS
  Filled 2023-06-24: qty 1

## 2023-06-24 MED ORDER — ALBUTEROL SULFATE (2.5 MG/3ML) 0.083% IN NEBU
2.5000 mg | INHALATION_SOLUTION | Freq: Once | RESPIRATORY_TRACT | Status: AC
  Administered 2023-06-24: 2.5 mg via RESPIRATORY_TRACT
  Filled 2023-06-24: qty 3

## 2023-06-24 MED ORDER — SODIUM CHLORIDE 0.9 % IV SOLN
1.0000 g | Freq: Once | INTRAVENOUS | Status: AC
  Administered 2023-06-24: 1 g via INTRAVENOUS
  Filled 2023-06-24: qty 10

## 2023-06-24 MED ORDER — SODIUM CHLORIDE 0.9 % IV BOLUS
1000.0000 mL | Freq: Once | INTRAVENOUS | Status: AC
  Administered 2023-06-24: 1000 mL via INTRAVENOUS

## 2023-06-24 MED ORDER — AMOXICILLIN-POT CLAVULANATE 875-125 MG PO TABS: 1.0000 | ORAL_TABLET | Freq: Two times a day (BID) | ORAL | 0 refills | Status: AC

## 2023-06-24 NOTE — ED Provider Notes (Signed)
 [place] EMERGENCY DEPARTMENT AT [facility] Provider Note   CSN: [id number] Arrival date & time: 06/24/23  1432     History  Chief Complaint  Patient presents with   Shortness of Breath    [name] is a 33 y.o. female.  HPI 32 year old female presents with cough and shortness of breath.  Patient has been having a cough and shortness of breath for about 3 weeks.  Productive of yellow and sometimes brown sputum.  No blood.  No fevers or chest pain.  Went to urgent care yesterday and was told she had bronchitis and put on prednisone , azithromycin  and Promethazine  DM.  Patient states that since waking up this morning she is feeling a lot worse and has more shortness of breath, and can barely walk a couple steps without getting short of breath and lightheaded.  No unilateral leg swelling or history of DVT.  She has been also having a progressive bitemporal headache that worsens with cough of these last 3 weeks as well.  She was given a breathing treatment in triage and states she temporarily felt a little better but is basically back to where she was.  No vomiting except occasional posttussive emesis.  No weakness or numbness in her extremities.  No neck stiffness.  Home Medications Prior to Admission medications   Medication Sig Start Date End Date Taking? Authorizing Provider  amoxicillin -clavulanate (AUGMENTIN ) 875-125 MG tablet Take 1 tablet by mouth every 12 (twelve) hours. 06/24/23  Yes Jerilynn Montenegro, MD  acetaminophen  (TYLENOL ) 325 MG tablet Take 2 tablets (650 mg total) by mouth every 6 (six) hours as needed for moderate pain (pain score 4-6). 02/15/23   Adolph Hoop, [name]  azithromycin  (ZITHROMAX ) 250 MG tablet Take 1 tablet (250 mg total) by mouth daily. Take first 2 tablets together, then 1 every day until finished. 06/23/23   Mayer, Jodi R, NP  cetirizine  (ZYRTEC  ALLERGY) 10 MG tablet Take 1 tablet (10 mg total) by mouth daily. 02/15/23   Adolph Hoop, [name]  fluticasone   (FLONASE ) 50 MCG/ACT nasal spray Place 2 sprays into both nostrils daily. 02/15/23   Adolph Hoop, [name]  ibuprofen  (ADVIL ) 600 MG tablet Take 1 tablet (600 mg total) by mouth every 6 (six) hours as needed. 02/19/23   Almond Army, CNM  lidocaine  (LIDODERM ) 5 % Place 1 patch onto the skin daily. Remove & Discard patch within 12 hours or as directed by MD 11/25/22   Henderly, Britni A, [name]  predniSONE  (DELTASONE ) 20 MG tablet Take 2 tablets (40 mg total) by mouth daily with breakfast for 3 days. 06/23/23 06/26/23  Mayer, Jodi R, NP  promethazine -dextromethorphan (PROMETHAZINE -DM) 6.25-15 MG/5ML syrup Take 5 mLs by mouth 4 (four) times daily as needed for cough. 06/23/23   Mayer, Jodi R, NP  Vitamin D , Ergocalciferol , (DRISDOL ) 1.25 MG (50000 UNIT) CAPS capsule Take 50,000 Units by mouth once a week. 12/10/19   [provider]      Allergies    Mushroom extract complex (obsolete)    Review of Systems   Review of Systems  Constitutional:  Negative for fever.  Respiratory:  Positive for cough and shortness of breath.   Cardiovascular:  Negative for chest pain and leg swelling.  Gastrointestinal:  Negative for vomiting.  Musculoskeletal:  Negative for neck pain and neck stiffness.  Neurological:  Positive for headaches. Negative for weakness and numbness.    Physical Exam Updated Vital Signs BP (!) 149/104   Pulse 70   Temp 98.3 F (  36.8 C) (Oral)   Resp 13   Ht 5\' 11"  (1.803 m)   Wt 111.1 kg   LMP 06/06/2023 (Exact Date)   SpO2 97%   BMI 34.17 kg/m  Physical Exam Vitals and nursing note reviewed.  Constitutional:      General: She is not in acute distress.    Appearance: She is well-developed. She is not ill-appearing or diaphoretic.  HENT:     Head: Normocephalic and atraumatic.  Cardiovascular:     Rate and Rhythm: Normal rate and regular rhythm.     Heart sounds: Normal heart sounds.  Pulmonary:     Effort: Pulmonary effort is normal.     Breath sounds: Normal breath  sounds. No wheezing.  Abdominal:     Palpations: Abdomen is soft.     Tenderness: There is no abdominal tenderness.  Musculoskeletal:     Cervical back: Normal range of motion. No rigidity.     Right lower leg: No edema.     Left lower leg: No edema.  Skin:    General: Skin is warm and dry.  Neurological:     Mental Status: She is alert.     Comments: CN 3-12 grossly intact. 5/5 strength in all 4 extremities. Grossly normal sensation. Normal finger to nose.      ED Results / Procedures / Treatments   Labs (all labs ordered are listed, but only abnormal results are displayed) Labs Reviewed  BASIC METABOLIC PANEL WITH GFR - Abnormal; Notable for the following components:      Result Value   CO2 19 (*)    Glucose, Bld 200 (*)    All other components within normal limits  CBC - Abnormal; Notable for the following components:   WBC 23.1 (*)    RBC 3.69 (*)    Hemoglobin 10.7 (*)    HCT 29.8 (*)    RDW 16.7 (*)    Platelets 462 (*)    nRBC 0.5 (*)    All other components within normal limits  CBC WITH DIFFERENTIAL/PLATELET - Abnormal; Notable for the following components:   WBC 24.1 (*)    RBC 3.66 (*)    Hemoglobin 10.9 (*)    HCT 29.9 (*)    MCHC 36.5 (*)    RDW 16.6 (*)    Platelets 476 (*)    nRBC 0.3 (*)    Neutro Abs 20.0 (*)    Monocytes Absolute 1.4 (*)    Abs Immature Granulocytes 0.16 (*)    All other components within normal limits  CULTURE, BLOOD (ROUTINE X 2)  CULTURE, BLOOD (ROUTINE X 2)  HCG, SERUM, QUALITATIVE  LACTIC ACID, PLASMA  TROPONIN I (HIGH SENSITIVITY)    EKG None  Radiology CT Angio Chest PE W and/or Wo Contrast Result Date: 06/24/2023 CLINICAL DATA:  Pulmonary embolism suspected, high probability. EXAM: CT ANGIOGRAPHY CHEST WITH CONTRAST TECHNIQUE: Multidetector CT imaging of the chest was performed using the standard protocol during bolus administration of intravenous contrast. Multiplanar CT image reconstructions and MIPs were obtained  to evaluate the vascular anatomy. RADIATION DOSE REDUCTION: This exam was performed according to the departmental dose-optimization program which includes automated exposure control, adjustment of the mA and/or kV according to patient size and/or use of iterative reconstruction technique. CONTRAST:  75mL OMNIPAQUE IOHEXOL 350 MG/ML SOLN COMPARISON:  06/28/2015. FINDINGS: Cardiovascular: The heart is enlarged and there is a trace pericardial effusion. The aorta and pulmonary trunk are normal in caliber. No pulmonary embolism is seen. Mediastinum/Nodes:  No enlarged mediastinal, hilar, or axillary lymph nodes. Thyroid  gland, trachea, and esophagus demonstrate no significant findings. Lungs/Pleura: Strandy atelectasis or infiltrate is noted in the right middle lobe. No effusion or pneumothorax is seen. Upper Abdomen: No acute abnormality. Musculoskeletal: Old healed rib fractures are noted on the right. No acute osseous abnormality. Review of the MIP images confirms the above findings. IMPRESSION: 1. No evidence of pulmonary embolism. 2. Minimal atelectasis or infiltrate in the right middle lobe. 3. Cardiomegaly. Electronically Signed   By: Wyvonnia Heimlich M.D.   On: 06/24/2023 19:45   CT Head Wo Contrast Result Date: 06/24/2023 CLINICAL DATA:  Headache, increasing frequency or severity EXAM: CT HEAD WITHOUT CONTRAST TECHNIQUE: Contiguous axial images were obtained from the base of the skull through the vertex without intravenous contrast. RADIATION DOSE REDUCTION: This exam was performed according to the departmental dose-optimization program which includes automated exposure control, adjustment of the mA and/or kV according to patient size and/or use of iterative reconstruction technique. COMPARISON:  CT head 06/28/2015 FINDINGS: Brain: No evidence of large-territorial acute infarction. No parenchymal hemorrhage. No mass lesion. No extra-axial collection. No mass effect or midline shift. No hydrocephalus. Basilar  cisterns are patent. Vascular: No hyperdense vessel. Skull: No acute fracture or focal lesion. Sinuses/Orbits: Paranasal sinuses and mastoid air cells are clear. The orbits are unremarkable. Other: None. IMPRESSION: No acute intracranial abnormality. Electronically Signed   By: Morgane  Naveau M.D.   On: 06/24/2023 19:40   DG Chest 2 View Result Date: 06/23/2023 CLINICAL DATA:  Cough EXAM: CHEST - 2 VIEW COMPARISON:  None Available. FINDINGS: The heart size and mediastinal contours are within normal limits. Both lungs are clear. The visualized skeletal structures are unremarkable. IMPRESSION: No active cardiopulmonary disease.  No evidence of lobar infiltrate. Electronically Signed   By: Reagan Camera M.D.   On: 06/23/2023 12:40    Procedures Procedures    Medications Ordered in ED Medications  cefTRIAXone  (ROCEPHIN ) 1 g in sodium chloride  0.9 % 100 mL IVPB (has no administration in time range)  albuterol  (PROVENTIL ) (2.5 MG/3ML) 0.083% nebulizer solution 2.5 mg (2.5 mg Nebulization Given 06/24/23 1443)  sodium chloride  0.9 % bolus 1,000 mL (1,000 mLs Intravenous New Bag/Given 06/24/23 1809)  ketorolac  (TORADOL ) 15 MG/ML injection 15 mg (15 mg Intravenous Given 06/24/23 1809)  iohexol (OMNIPAQUE) 350 MG/ML injection 75 mL (75 mLs Intravenous Contrast Given 06/24/23 1934)    ED Course/ Medical Decision Making/ A&P                                 Medical Decision Making Amount and/or Complexity of Data Reviewed Labs: ordered.    Details: Leukocytosis.  Normal lactate and troponin. Radiology: ordered and independent interpretation performed.    Details: No pulmonary embolism. ECG/medicine tests: ordered and independent interpretation performed.    Details: No ischemia.  Risk Prescription drug management.   Patient's headache is improved with some Toradol  and fluids.  CT head without head liter swelling.  No neurofindings on exam.  Low suspicion for subarachnoid hemorrhage, stroke,  meningitis/encephalitis, etc.  She does have questionable pneumonia on her CTA.  No PE.  Her leukocytosis is impressive but she is also had 2 doses of steroids and this might be reactive to that.  She is already on azithromycin  by think it is reasonable to add Augmentin .  She will be given a dose of Rocephin  here.  She was able to ambulate and  not desaturate or become tachycardic.  She felt better than she has been and feels comfortable going home.  I think this is pretty reasonable.  Her curb-65 score is 0.  She feels well enough for discharge, will give return precautions but otherwise recommend close PCP follow-up.        Final Clinical Impression(s) / ED Diagnoses Final diagnoses:  Community acquired pneumonia of right middle lobe of lung    Rx / DC Orders ED Discharge Orders          Ordered    amoxicillin -clavulanate (AUGMENTIN ) 875-125 MG tablet  Every 12 hours        06/24/23 2009              Jerilynn Montenegro, MD 06/24/23 2033

## 2023-06-24 NOTE — ED Triage Notes (Signed)
 Pt c.o sob and cold like symptoms for the past 3 weeks. Pt seen at [facility] yesterday and was told she had bronchitis. Pt tachypneic on exertion

## 2023-06-24 NOTE — ED Notes (Signed)
 Patient ambulated without difficulty in the milieu. Patient HR remained in the 90's and oxygen saturation remained at 100% on RA. Patient did not express any dizziness or weakness while walking. Patient did complain of worsening headache, in which MD Aldean Amass was notified.

## 2023-06-24 NOTE — Discharge Instructions (Signed)
 Continue to take the azithromycin  that you were given at urgent care.  We are also prescribing you a new antibiotic, amoxicillin -clavulanate.  Take both of these and finish their courses.  This should cover the typical pathogens for pneumonia.  You may also take ibuprofen  and Tylenol  to help with pain and be sure to drink plenty of fluids.  If you develop fever, new or worsening shortness of breath, coughing up blood, chest pain, or any other new/concerning symptoms then return to the ER.  Otherwise follow-up closely with your primary care physician.

## 2023-06-26 ENCOUNTER — Ambulatory Visit (HOSPITAL_COMMUNITY): Payer: Self-pay

## 2023-06-29 LAB — CULTURE, BLOOD (ROUTINE X 2)
Culture: NO GROWTH
Culture: NO GROWTH

## 2023-07-10 DIAGNOSIS — Z79899 Other long term (current) drug therapy: Secondary | ICD-10-CM | POA: Diagnosis not present

## 2023-07-10 DIAGNOSIS — E669 Obesity, unspecified: Secondary | ICD-10-CM | POA: Diagnosis not present

## 2023-07-10 DIAGNOSIS — M79605 Pain in left leg: Secondary | ICD-10-CM | POA: Diagnosis not present

## 2023-07-10 DIAGNOSIS — M62838 Other muscle spasm: Secondary | ICD-10-CM | POA: Diagnosis not present

## 2023-07-10 DIAGNOSIS — Z6835 Body mass index (BMI) 35.0-35.9, adult: Secondary | ICD-10-CM | POA: Diagnosis not present

## 2023-07-10 DIAGNOSIS — N914 Secondary oligomenorrhea: Secondary | ICD-10-CM | POA: Diagnosis not present

## 2023-07-10 DIAGNOSIS — G629 Polyneuropathy, unspecified: Secondary | ICD-10-CM | POA: Diagnosis not present

## 2023-07-18 DIAGNOSIS — Z79899 Other long term (current) drug therapy: Secondary | ICD-10-CM | POA: Diagnosis not present

## 2023-08-09 DIAGNOSIS — E559 Vitamin D deficiency, unspecified: Secondary | ICD-10-CM | POA: Diagnosis not present

## 2023-08-09 DIAGNOSIS — G629 Polyneuropathy, unspecified: Secondary | ICD-10-CM | POA: Diagnosis not present

## 2023-08-09 DIAGNOSIS — M62838 Other muscle spasm: Secondary | ICD-10-CM | POA: Diagnosis not present

## 2023-08-09 DIAGNOSIS — N914 Secondary oligomenorrhea: Secondary | ICD-10-CM | POA: Diagnosis not present

## 2023-08-09 DIAGNOSIS — R0602 Shortness of breath: Secondary | ICD-10-CM | POA: Diagnosis not present

## 2023-08-09 DIAGNOSIS — E669 Obesity, unspecified: Secondary | ICD-10-CM | POA: Diagnosis not present

## 2023-08-09 DIAGNOSIS — M79605 Pain in left leg: Secondary | ICD-10-CM | POA: Diagnosis not present

## 2023-08-09 DIAGNOSIS — Z79899 Other long term (current) drug therapy: Secondary | ICD-10-CM | POA: Diagnosis not present

## 2023-08-11 DIAGNOSIS — Z79899 Other long term (current) drug therapy: Secondary | ICD-10-CM | POA: Diagnosis not present

## 2023-08-22 DIAGNOSIS — Z113 Encounter for screening for infections with a predominantly sexual mode of transmission: Secondary | ICD-10-CM | POA: Diagnosis not present

## 2023-09-13 DIAGNOSIS — E669 Obesity, unspecified: Secondary | ICD-10-CM | POA: Diagnosis not present

## 2023-09-13 DIAGNOSIS — Z7689 Persons encountering health services in other specified circumstances: Secondary | ICD-10-CM | POA: Diagnosis not present

## 2023-09-13 DIAGNOSIS — G629 Polyneuropathy, unspecified: Secondary | ICD-10-CM | POA: Diagnosis not present

## 2023-09-13 DIAGNOSIS — N914 Secondary oligomenorrhea: Secondary | ICD-10-CM | POA: Diagnosis not present

## 2023-09-13 DIAGNOSIS — Z79899 Other long term (current) drug therapy: Secondary | ICD-10-CM | POA: Diagnosis not present

## 2023-09-13 DIAGNOSIS — M79605 Pain in left leg: Secondary | ICD-10-CM | POA: Diagnosis not present

## 2023-09-13 DIAGNOSIS — E559 Vitamin D deficiency, unspecified: Secondary | ICD-10-CM | POA: Diagnosis not present

## 2023-09-13 DIAGNOSIS — M62838 Other muscle spasm: Secondary | ICD-10-CM | POA: Diagnosis not present

## 2023-09-15 DIAGNOSIS — Z79899 Other long term (current) drug therapy: Secondary | ICD-10-CM | POA: Diagnosis not present

## 2023-10-18 DIAGNOSIS — E669 Obesity, unspecified: Secondary | ICD-10-CM | POA: Diagnosis not present

## 2023-10-18 DIAGNOSIS — E559 Vitamin D deficiency, unspecified: Secondary | ICD-10-CM | POA: Diagnosis not present

## 2023-10-18 DIAGNOSIS — M79605 Pain in left leg: Secondary | ICD-10-CM | POA: Diagnosis not present

## 2023-10-18 DIAGNOSIS — M62838 Other muscle spasm: Secondary | ICD-10-CM | POA: Diagnosis not present

## 2023-10-18 DIAGNOSIS — G629 Polyneuropathy, unspecified: Secondary | ICD-10-CM | POA: Diagnosis not present

## 2023-10-18 DIAGNOSIS — Z79899 Other long term (current) drug therapy: Secondary | ICD-10-CM | POA: Diagnosis not present

## 2023-10-18 DIAGNOSIS — N914 Secondary oligomenorrhea: Secondary | ICD-10-CM | POA: Diagnosis not present

## 2023-10-20 ENCOUNTER — Emergency Department (HOSPITAL_COMMUNITY)

## 2023-10-20 ENCOUNTER — Emergency Department (HOSPITAL_COMMUNITY): Admission: EM | Admit: 2023-10-20 | Discharge: 2023-10-21 | Discharge status: Against Medical Advice

## 2023-10-20 ENCOUNTER — Encounter (HOSPITAL_COMMUNITY): Payer: Self-pay | Admitting: *Deleted

## 2023-10-20 ENCOUNTER — Other Ambulatory Visit: Payer: Self-pay

## 2023-10-20 DIAGNOSIS — H53149 Visual discomfort, unspecified: Secondary | ICD-10-CM | POA: Insufficient documentation

## 2023-10-20 DIAGNOSIS — Z79899 Other long term (current) drug therapy: Secondary | ICD-10-CM | POA: Diagnosis not present

## 2023-10-20 DIAGNOSIS — R519 Headache, unspecified: Secondary | ICD-10-CM | POA: Diagnosis present

## 2023-10-20 DIAGNOSIS — Z5321 Procedure and treatment not carried out due to patient leaving prior to being seen by health care provider: Secondary | ICD-10-CM | POA: Diagnosis not present

## 2023-10-20 LAB — URINALYSIS, W/ REFLEX TO CULTURE (INFECTION SUSPECTED)
Bilirubin Urine: NEGATIVE
Glucose, UA: NEGATIVE mg/dL
Ketones, ur: NEGATIVE mg/dL
Nitrite: NEGATIVE
Protein, ur: 100 mg/dL — AB
Specific Gravity, Urine: 1.012 (ref 1.005–1.030)
WBC, UA: >50 WBC/hpf (ref 0–5)
pH: 5 (ref 5.0–8.0)

## 2023-10-20 LAB — CBC WITH DIFFERENTIAL/PLATELET
Abs Immature Granulocytes: 0.06 K/uL (ref 0.00–0.07)
Basophils Absolute: 0.1 K/uL (ref 0.0–0.1)
Basophils Relative: 1 %
Eosinophils Absolute: 0.1 K/uL (ref 0.0–0.5)
Eosinophils Relative: 0 %
HCT: 27.4 % — ABNORMAL LOW (ref 36.0–46.0)
Hemoglobin: 9.8 g/dL — ABNORMAL LOW (ref 12.0–15.0)
Immature Granulocytes: 0 %
Lymphocytes Relative: 29 %
Lymphs Abs: 4.2 K/uL — ABNORMAL HIGH (ref 0.7–4.0)
MCH: 28.2 pg (ref 26.0–34.0)
MCHC: 35.8 g/dL (ref 30.0–36.0)
MCV: 79 fL — ABNORMAL LOW (ref 80.0–100.0)
Monocytes Absolute: 1.4 K/uL — ABNORMAL HIGH (ref 0.1–1.0)
Monocytes Relative: 9 %
Neutro Abs: 8.8 K/uL — ABNORMAL HIGH (ref 1.7–7.7)
Neutrophils Relative %: 61 %
Platelets: 396 K/uL (ref 150–400)
RBC: 3.47 MIL/uL — ABNORMAL LOW (ref 3.87–5.11)
RDW: 16.9 % — ABNORMAL HIGH (ref 11.5–15.5)
WBC: 14.5 K/uL — ABNORMAL HIGH (ref 4.0–10.5)
nRBC: 0.2 % (ref 0.0–0.2)

## 2023-10-20 LAB — HCG, SERUM, QUALITATIVE: Preg, Serum: NEGATIVE

## 2023-10-20 LAB — COMPREHENSIVE METABOLIC PANEL WITH GFR
ALT: 36 U/L (ref 0–44)
AST: 39 U/L (ref 15–41)
Albumin: 3.8 g/dL (ref 3.5–5.0)
Alkaline Phosphatase: 62 U/L (ref 38–126)
Anion gap: 13 (ref 5–15)
BUN: 8 mg/dL (ref 6–20)
CO2: 20 mmol/L — ABNORMAL LOW (ref 22–32)
Calcium: 8.8 mg/dL — ABNORMAL LOW (ref 8.9–10.3)
Chloride: 102 mmol/L (ref 98–111)
Creatinine, Ser: 1.05 mg/dL — ABNORMAL HIGH (ref 0.44–1.00)
GFR, Estimated: >60 mL/min (ref >60)
Glucose, Bld: 95 mg/dL (ref 70–99)
Potassium: 3.5 mmol/L (ref 3.5–5.1)
Sodium: 135 mmol/L (ref 135–145)
Total Bilirubin: 2.4 mg/dL — ABNORMAL HIGH (ref 0.0–1.2)
Total Protein: 7.6 g/dL (ref 6.5–8.1)

## 2023-10-20 LAB — PROTIME-INR
INR: 1 (ref 0.8–1.2)
Prothrombin Time: 14.2 s (ref 11.4–15.2)

## 2023-10-20 LAB — RESP PANEL BY RT-PCR (RSV, FLU A&B, COVID)  RVPGX2
Influenza A by PCR: NEGATIVE
Influenza B by PCR: NEGATIVE
Resp Syncytial Virus by PCR: NEGATIVE
SARS Coronavirus 2 by RT PCR: NEGATIVE

## 2023-10-20 LAB — I-STAT CG4 LACTIC ACID, ED: Lactic Acid, Venous: 1.1 mmol/L (ref 0.5–1.9)

## 2023-10-20 MED ORDER — ACETAMINOPHEN 325 MG PO TABS
650.0000 mg | ORAL_TABLET | Freq: Once | ORAL | Status: AC
  Administered 2023-10-20: 650 mg via ORAL
  Filled 2023-10-20: qty 2

## 2023-10-20 NOTE — ED Provider Triage Note (Signed)
 Emergency Medicine Provider Triage Evaluation Note  [name] , a 33 y.o. female  was evaluated in triage.  Pt complains of migraine that has been ongoing for a few weeks that has worsened today. She reports photophobia. She states she feels febrile. No other medical conditions. No daily medications.  Review of Systems  Positive: headache Negative:   Physical Exam  BP (!) 135/91   Pulse (!) 111   Temp (!) 101.1 F (38.4 C)   Resp 18   Ht 5' 11 (1.803 m)   Wt 111.1 kg   LMP  (LMP Unknown)   SpO2 98%   BMI 34.16 kg/m  Gen:   Awake, no distress. Febrile and tachycardic in triage Resp:  Normal effort. tachycardic MSK:   Moves extremities without difficulty Other:    Medical Decision Making  Medically screening exam initiated at 7:41 PM.  Appropriate orders placed.  [name] was informed that the remainder of the evaluation will be completed by another provider, this initial triage assessment does not replace that evaluation, and the importance of remaining in the ED until their evaluation is complete.     [name], [place] 10/20/23 1943

## 2023-10-20 NOTE — ED Triage Notes (Signed)
 The pt is here for a headache she is huffing  and puffing about being asked the same questions over  she refused to answer questions said she was feeling like she was going to pass out and was going to call her mother

## 2023-10-20 NOTE — ED Notes (Signed)
 Pt notified urine sample is needed and provided with urinalysis cup.

## 2023-10-21 NOTE — ED Notes (Signed)
 Pt name called for updated vitals, no response

## 2023-10-22 LAB — URINE CULTURE: Culture: >=100000 — AB

## 2023-10-23 ENCOUNTER — Telehealth (HOSPITAL_BASED_OUTPATIENT_CLINIC_OR_DEPARTMENT_OTHER): Payer: Self-pay | Admitting: *Deleted

## 2023-10-23 NOTE — Telephone Encounter (Signed)
 Post ED Visit - Positive Culture Follow-up: Successful Patient Follow-Up  Culture assessed and recommendations reviewed by:  [x]  Sharyne Glatter, Pharm.D. []  Venetia Gully, Pharm.D., BCPS AQ-ID []  Garrel Crews, Pharm.D., BCPS []  Almarie Lunger, Pharm.D., BCPS []  [place], [address].D., BCPS, AAHIVP []  Rosaline Bihari, Pharm.D., BCPS, AAHIVP []  Vernell Meier, PharmD, BCPS []  Latanya Hint, PharmD, BCPS []  Donald Medley, PharmD, BCPS []  Rocky Bold, PharmD  Positive urine culture  [x]  Patient discharged without antimicrobial prescription and treatment is now indicated []  Organism is resistant to prescribed ED discharge antimicrobial []  Patient with positive blood cultures  Changes discussed with ED provider: Rolan Quale, DO New antibiotic prescription: Cephalexin 500 mg po Q 12 hrs x 5 days Called to [facility] in [place], [place]  Contacted patient, date 10/23/23, time 1125   [name] 10/23/2023, 12:19 PM

## 2023-10-25 LAB — CULTURE, BLOOD (ROUTINE X 2)
Culture: NO GROWTH
Special Requests: ADEQUATE

## 2023-11-03 DIAGNOSIS — Z1331 Encounter for screening for depression: Secondary | ICD-10-CM | POA: Diagnosis not present

## 2023-11-03 DIAGNOSIS — E785 Hyperlipidemia, unspecified: Secondary | ICD-10-CM | POA: Diagnosis not present

## 2023-11-03 DIAGNOSIS — R0602 Shortness of breath: Secondary | ICD-10-CM | POA: Diagnosis not present

## 2023-11-03 DIAGNOSIS — Z79899 Other long term (current) drug therapy: Secondary | ICD-10-CM | POA: Diagnosis not present

## 2023-11-03 DIAGNOSIS — Z6835 Body mass index (BMI) 35.0-35.9, adult: Secondary | ICD-10-CM | POA: Diagnosis not present

## 2023-11-03 DIAGNOSIS — Z1339 Encounter for screening examination for other mental health and behavioral disorders: Secondary | ICD-10-CM | POA: Diagnosis not present

## 2023-11-03 DIAGNOSIS — D539 Nutritional anemia, unspecified: Secondary | ICD-10-CM | POA: Diagnosis not present

## 2023-11-03 DIAGNOSIS — N914 Secondary oligomenorrhea: Secondary | ICD-10-CM | POA: Diagnosis not present

## 2023-11-15 DIAGNOSIS — E669 Obesity, unspecified: Secondary | ICD-10-CM | POA: Diagnosis not present

## 2023-11-15 DIAGNOSIS — M79605 Pain in left leg: Secondary | ICD-10-CM | POA: Diagnosis not present

## 2023-11-15 DIAGNOSIS — N914 Secondary oligomenorrhea: Secondary | ICD-10-CM | POA: Diagnosis not present

## 2023-11-15 DIAGNOSIS — G629 Polyneuropathy, unspecified: Secondary | ICD-10-CM | POA: Diagnosis not present

## 2023-11-15 DIAGNOSIS — Z79899 Other long term (current) drug therapy: Secondary | ICD-10-CM | POA: Diagnosis not present

## 2023-11-15 DIAGNOSIS — E6609 Other obesity due to excess calories: Secondary | ICD-10-CM | POA: Diagnosis not present

## 2023-11-15 DIAGNOSIS — M62838 Other muscle spasm: Secondary | ICD-10-CM | POA: Diagnosis not present

## 2023-11-27 DIAGNOSIS — E7800 Pure hypercholesterolemia, unspecified: Secondary | ICD-10-CM | POA: Diagnosis not present

## 2023-11-27 DIAGNOSIS — Z6836 Body mass index (BMI) 36.0-36.9, adult: Secondary | ICD-10-CM | POA: Diagnosis not present

## 2023-11-27 DIAGNOSIS — R300 Dysuria: Secondary | ICD-10-CM | POA: Diagnosis not present

## 2023-11-27 DIAGNOSIS — E6609 Other obesity due to excess calories: Secondary | ICD-10-CM | POA: Diagnosis not present

## 2023-11-27 DIAGNOSIS — R030 Elevated blood-pressure reading, without diagnosis of hypertension: Secondary | ICD-10-CM | POA: Diagnosis not present

## 2023-11-27 DIAGNOSIS — N390 Urinary tract infection, site not specified: Secondary | ICD-10-CM | POA: Diagnosis not present

## 2023-11-27 DIAGNOSIS — R319 Hematuria, unspecified: Secondary | ICD-10-CM | POA: Diagnosis not present

## 2023-11-27 DIAGNOSIS — Z3200 Encounter for pregnancy test, result unknown: Secondary | ICD-10-CM | POA: Diagnosis not present

## 2023-11-27 DIAGNOSIS — R7401 Elevation of levels of liver transaminase levels: Secondary | ICD-10-CM | POA: Diagnosis not present

## 2023-11-27 DIAGNOSIS — D539 Nutritional anemia, unspecified: Secondary | ICD-10-CM | POA: Diagnosis not present

## 2023-12-09 ENCOUNTER — Ambulatory Visit

## 2023-12-09 ENCOUNTER — Ambulatory Visit: Admission: EM | Admit: 2023-12-09 | Discharge: 2023-12-09 | Disposition: A | Discharge status: Home / Self Care

## 2023-12-09 ENCOUNTER — Other Ambulatory Visit: Payer: Self-pay

## 2023-12-09 DIAGNOSIS — S6991XA Unspecified injury of right wrist, hand and finger(s), initial encounter: Secondary | ICD-10-CM | POA: Diagnosis not present

## 2023-12-09 DIAGNOSIS — M79641 Pain in right hand: Secondary | ICD-10-CM

## 2023-12-09 DIAGNOSIS — M7989 Other specified soft tissue disorders: Secondary | ICD-10-CM

## 2023-12-09 MED ORDER — NAPROXEN 500 MG PO TABS: 500.0000 mg | ORAL_TABLET | Freq: Every day | ORAL | 0 refills | Status: AC | PRN

## 2023-12-09 NOTE — Discharge Instructions (Addendum)
 X-ray of the right hand done today.  Final evaluation by the radiologist shows no acute fracture.  Symptoms and physical exam findings are most consistent with swelling and pain around the knuckles.  As there is no fracture present we will use an Ace wrap to help with the swelling and support for the hand.  Recommend wearing this during the day and elevating the hand to help with swelling.  May remove this at night and with bathing. Ice the area 2-3 times daily for 10-15 minutes to help with pain and swelling. Do not apply ice directly to the skin.  May use naproxen  500 mg once daily for pain.  A refill of this has been called to your pharmacy.  If you have no improvement in your symptoms in 5 to 7 days then recommend following up with an orthopedist.
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
## 2023-12-12 ENCOUNTER — Inpatient Hospital Stay

## 2023-12-14 DIAGNOSIS — E559 Vitamin D deficiency, unspecified: Secondary | ICD-10-CM | POA: Diagnosis not present

## 2023-12-14 DIAGNOSIS — N914 Secondary oligomenorrhea: Secondary | ICD-10-CM | POA: Diagnosis not present

## 2023-12-14 DIAGNOSIS — M62838 Other muscle spasm: Secondary | ICD-10-CM | POA: Diagnosis not present

## 2023-12-14 DIAGNOSIS — G629 Polyneuropathy, unspecified: Secondary | ICD-10-CM | POA: Diagnosis not present

## 2023-12-14 DIAGNOSIS — M79605 Pain in left leg: Secondary | ICD-10-CM | POA: Diagnosis not present

## 2023-12-14 DIAGNOSIS — Z79899 Other long term (current) drug therapy: Secondary | ICD-10-CM | POA: Diagnosis not present

## 2023-12-14 DIAGNOSIS — E669 Obesity, unspecified: Secondary | ICD-10-CM | POA: Diagnosis not present

## 2023-12-18 DIAGNOSIS — Z79899 Other long term (current) drug therapy: Secondary | ICD-10-CM | POA: Diagnosis not present

## 2024-01-08 ENCOUNTER — Other Ambulatory Visit: Payer: Self-pay

## 2024-01-08 DIAGNOSIS — D539 Nutritional anemia, unspecified: Secondary | ICD-10-CM

## 2024-01-09 ENCOUNTER — Inpatient Hospital Stay

## 2024-01-09 ENCOUNTER — Inpatient Hospital Stay: Attending: Internal Medicine | Admitting: Internal Medicine

## 2024-01-09 VITALS — BP 135/87 | HR 62 | Temp 97.8°F | Resp 17 | Ht 71.0 in | Wt 250.0 lb

## 2024-01-09 DIAGNOSIS — Z87891 Personal history of nicotine dependence: Secondary | ICD-10-CM | POA: Insufficient documentation

## 2024-01-09 DIAGNOSIS — Z139 Encounter for screening, unspecified: Secondary | ICD-10-CM

## 2024-01-09 DIAGNOSIS — D509 Iron deficiency anemia, unspecified: Secondary | ICD-10-CM | POA: Diagnosis present

## 2024-01-09 DIAGNOSIS — D573 Sickle-cell trait: Secondary | ICD-10-CM | POA: Diagnosis not present

## 2024-01-09 DIAGNOSIS — D539 Nutritional anemia, unspecified: Secondary | ICD-10-CM

## 2024-01-09 DIAGNOSIS — Z79899 Other long term (current) drug therapy: Secondary | ICD-10-CM | POA: Insufficient documentation

## 2024-01-09 LAB — CMP (CANCER CENTER ONLY)
ALT: 23 U/L (ref 0–44)
AST: 31 U/L (ref 15–41)
Albumin: 4.7 g/dL (ref 3.5–5.0)
Alkaline Phosphatase: 59 U/L (ref 38–126)
Anion gap: 13 (ref 5–15)
BUN: 9 mg/dL (ref 6–20)
CO2: 23 mmol/L (ref 22–32)
Calcium: 9.4 mg/dL (ref 8.9–10.3)
Chloride: 103 mmol/L (ref 98–111)
Creatinine: 0.95 mg/dL (ref 0.44–1.00)
GFR, Estimated: >60 mL/min (ref >60)
Glucose, Bld: 92 mg/dL (ref 70–99)
Potassium: 3.8 mmol/L (ref 3.5–5.1)
Sodium: 138 mmol/L (ref 135–145)
Total Bilirubin: 1.5 mg/dL — ABNORMAL HIGH (ref 0.0–1.2)
Total Protein: 8.3 g/dL — ABNORMAL HIGH (ref 6.5–8.1)

## 2024-01-09 LAB — CBC WITH DIFFERENTIAL (CANCER CENTER ONLY)
Abs Immature Granulocytes: 0.03 K/uL (ref 0.00–0.07)
Basophils Absolute: 0.1 K/uL (ref 0.0–0.1)
Basophils Relative: 1 %
Eosinophils Absolute: 0.2 K/uL (ref 0.0–0.5)
Eosinophils Relative: 2 %
HCT: 29.7 % — ABNORMAL LOW (ref 36.0–46.0)
Hemoglobin: 10.8 g/dL — ABNORMAL LOW (ref 12.0–15.0)
Immature Granulocytes: 0 %
Lymphocytes Relative: 29 %
Lymphs Abs: 3.6 K/uL (ref 0.7–4.0)
MCH: 27.6 pg (ref 26.0–34.0)
MCHC: 36.4 g/dL — ABNORMAL HIGH (ref 30.0–36.0)
MCV: 76 fL — ABNORMAL LOW (ref 80.0–100.0)
Monocytes Absolute: 0.8 K/uL (ref 0.1–1.0)
Monocytes Relative: 6 %
Neutro Abs: 8 K/uL — ABNORMAL HIGH (ref 1.7–7.7)
Neutrophils Relative %: 62 %
Platelet Count: 493 K/uL — ABNORMAL HIGH (ref 150–400)
RBC: 3.91 MIL/uL (ref 3.87–5.11)
RDW: 18.9 % — ABNORMAL HIGH (ref 11.5–15.5)
WBC Count: 12.7 K/uL — ABNORMAL HIGH (ref 4.0–10.5)
nRBC: 0.9 % — ABNORMAL HIGH (ref 0.0–0.2)

## 2024-01-09 LAB — IRON AND IRON BINDING CAPACITY (CC-WL,HP ONLY)
Iron: 57 ug/dL (ref 28–170)
Saturation Ratios: 17 % (ref 10.4–31.8)
TIBC: 347 ug/dL (ref 250–450)
UIBC: 290 ug/dL

## 2024-01-09 LAB — FERRITIN: Ferritin: 306 ng/mL (ref 11–307)

## 2024-01-09 LAB — VITAMIN B12: Vitamin B-12: 284 pg/mL (ref 180–914)

## 2024-01-09 NOTE — Progress Notes (Signed)
 [facility] [phone]   [phone]  CONSULT NOTE  REFERRING PHYSICIAN: Bernarda Das.NP  REASON FOR CONSULTATION:  33 years old African-American female with microcytic anemia  HPI [name] is a 33 y.o. female.   HPI  Discussed the use of AI scribe software for clinical note transcription with the patient, who gave verbal consent to proceed.  History of Present Illness [name] is a 33 year old female with chronic iron deficiency anemia and sickle cell trait who presents for evaluation of persistent anemia.  She has a history of persistent anemia with documented low hemoglobin and hematocrit since at least 2017, recently identified as iron deficiency anemia. She initiated oral ferrous sulfate therapy within the past few months and has been adherent, taking the medication with vitamin C to enhance absorption. She is unsure of the exact dose and frequency but follows her provider's instructions.  Her menstrual cycles are irregular since a pregnancy loss earlier this year, with menses occurring monthly and lasting 5 to 7 days. Recent periods have been notably heavy, with significant blood clots and only the last two days being lighter. She denies gastrointestinal, nasal, or gingival bleeding, and has not observed blood in stool or urine.  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 She also notes a prior episode of bronchitis and pneumonia. She denies pica, chest pain, shortness of breath, or bleeding symptoms outside of menstruation.  She was diagnosed with sickle cell trait earlier in life and recalls joint pain and frequent blood draws as a child. Family history significant for father with sickle cell and mother  Condition. The patient is single and has 1 daughter age 52.  She has a history of smoking tobacco  and marijuana.  She drinks alcohol occasionally and no history of drug abuse.    Past Medical History:  Diagnosis Date   Abdominal pain affecting pregnancy 10/02/2017   Abnormal glucose tolerance test (GTT) during pregnancy, antepartum 09/13/2017   Results for STELLAH, DONOVAN (MRN [id number]) as of 09/13/2017 15:11  Ref. Range 08/16/2017 10:22 Glucose, 1 hour Latest Ref Range: 65 - 179 mg/dL 880 Glucose, Fasting Latest Ref Range: 65 - 91 mg/dL 92 (H) Glucose, 2 hour Latest Ref Range: 65 - 152 mg/dL 83    Abnormal hemoglobin 05/02/2017   HGBS-C [X ] heme referral [X]  genetics referral GALERIUS.GANT ] baseline CBC, CMP, urinalysis, UPC/24-hr urine protein GALERIUS.GANT ] folic acid  5 mg/day    Adjustment disorder with depressed mood    Antepartum low grade squamous intraepithelial cervical dysplasia complicating pregnancy    Cervical transverse process fracture (HCC)    Complication of anesthesia    Gestational diabetes 10/19/2017   Lumbar transverse process fracture (HCC)    No pertinent past medical history    Pelvic fracture (HCC)    PTSD (post-traumatic stress disorder)    Sickle cell trait    Supervision of normal first pregnancy, antepartum 04/26/2017   .SABRA Nursing Staff Provider Office Location [facility] Dating  U/S Language  English Anatomy US   Normal anatomy; female fetus  F/u growth ordered  Flu Vaccine  Declined 09/27/17 Genetic Screen  NIPS: Panaroma:  Neg  AFP:   First Screen:  Quad:   TDaP vaccine   Declined 08-30-17 Hgb A1C or  GTT Early  Third trimester normal Rhogam  N/a A+   LAB RESULTS  Feeding  Plan Breast Blood Type A/Positive/-- (04/03   SVD (spontaneous vaginal delivery) 11/06/2017   TBI (traumatic brain injury) [facility])       Past Surgical History:  Procedure Laterality Date   APPLICATION OF WOUND VAC Left 06/30/2015   Procedure: APPLICATION OF WOUND VAC ;  Surgeon: Ozell Bruch, MD;  Location: [facility] OR;  Service: Orthopedics;  Laterality: Left;  L ankle and L calf   CLOSED REDUCTION NASAL FRACTURE N/A  06/28/2015   Procedure: CLOSED REDUCTION NASoSepto FRACTURE;  Surgeon: Daniel Moccasin, MD;  Location: [facility] OR;  Service: ENT;  Laterality: N/A;   ESOPHAGOSCOPY N/A 06/28/2015   Procedure: ESOPHAGOSCOPY;  Surgeon: Daniel Moccasin, MD;  Location: [facility] OR;  Service: ENT;  Laterality: N/A;   EXTERNAL FIXATION LEG Left 06/28/2015   Procedure: EXTERNAL FIXATION LEG;  Surgeon: Redell Shoals, MD;  Location: [facility] OR;  Service: Orthopedics;  Laterality: Left;   EXTERNAL FIXATION LEG Left 07/03/2015   Procedure: REMOVAL OF EXTERNAL FIXATION LEFT LEG;  Surgeon: Ozell Bruch, MD;  Location: [facility] OR;  Service: Orthopedics;  Laterality: Left;   FEMUR IM NAIL Left 07/03/2015   Procedure: INTRAMEDULLARY (IM) NAIL FEMORAL;  Surgeon: Ozell Bruch, MD;  Location: [facility] OR;  Service: Orthopedics;  Laterality: Left;   I & D EXTREMITY Left 06/28/2015   Procedure: IRRIGATION AND DEBRIDEMENT EXTREMITY;  Surgeon: Redell Shoals, MD;  Location: [facility] OR;  Service: Orthopedics;  Laterality: Left;   I & D EXTREMITY Left 06/30/2015   Procedure: IRRIGATION AND DEBRIDEMENT 3A TIBIA PROXIMAL PLATEAU AND SHAFT WITH  EXTERNAL FIXATOR ADJUSTMENT;  Surgeon: Ozell Bruch, MD;  Location: [facility] OR;  Service: Orthopedics;  Laterality: Left;   I & D EXTREMITY Left 07/03/2015   Procedure: IRRIGATION AND DEBRIDEMENT LEFT OPEN TIBIA;  Surgeon: Ozell Bruch, MD;  Location: [facility] OR;  Service: Orthopedics;  Laterality: Left;   IRRIGATION AND DEBRIDEMENT FOOT Left 06/30/2015   Procedure: IRRIGATION AND DEBRIDEMENT OPEN 3A LEFT ANKLE JOINT DISLOCATION AND OPEN LEFT BIMALLEOUS FRACTURE ;  Surgeon: Ozell Bruch, MD;  Location: [facility] OR;  Service: Orthopedics;  Laterality: Left;   LARYNGOSCOPY AND BRONCHOSCOPY N/A 06/28/2015   Procedure: LARYNGOSCOPY AND BRONCHOSCOPY;  Surgeon: Daniel Moccasin, MD;  Location: [facility] OR;  Service: ENT;  Laterality: N/A;   ORIF ACETABULAR FRACTURE N/A 07/03/2015   Procedure: OPEN REDUCTION INTERNAL FIXATION (ORIF) ACETABULAR FRACTURE;  Surgeon: Ozell Bruch, MD;  Location: [facility]  OR;  Service: Orthopedics;  Laterality: N/A;   ORIF ANKLE FRACTURE Left 06/30/2015   Procedure: OPEN REDUCTION INTERNAL FIXATION (ORIF) ANTERIOR COLUMN ACETABULUM;  Surgeon: Ozell Bruch, MD;  Location: [facility];  Service: Orthopedics;  Laterality: Left;   ORIF PELVIC FRACTURE Bilateral 06/30/2015   Procedure:  TRANS-SACRAL SCREWS ;  Surgeon: Ozell Bruch, MD;  Location: [facility] OR;  Service: Orthopedics;  Laterality: Bilateral;   ORIF TIBIA PLATEAU Left 07/03/2015   Procedure: OPEN REDUCTION INTERNAL FIXATION (ORIF) TIBIAL PLATEAU;  Surgeon: Ozell Bruch, MD;  Location: [facility] OR;  Service: Orthopedics;  Laterality: Left;    Family History  Problem Relation Age of Onset   ADD / ADHD Neg Hx    Alcohol abuse Neg Hx    Arthritis Neg Hx    Anxiety disorder Neg Hx    Asthma Neg Hx    Birth defects Neg Hx    Cancer Neg Hx    COPD Neg Hx    Depression Neg Hx    Diabetes Neg Hx    Drug abuse Neg Hx    Early death Neg Hx  Hearing loss Neg Hx    Heart disease Neg Hx    Hyperlipidemia Neg Hx    Hypertension Neg Hx    Intellectual disability Neg Hx    Kidney disease Neg Hx    Miscarriages / Stillbirths Neg Hx    Learning disabilities Neg Hx    Obesity Neg Hx    Vision loss Neg Hx    Varicose Veins Neg Hx    Stroke Neg Hx     Social History Social History[1]  Allergies[2]  Current Outpatient Medications  Medication Sig Dispense Refill   acetaminophen  (TYLENOL ) 325 MG tablet Take 2 tablets (650 mg total) by mouth every 6 (six) hours as needed for moderate pain (pain score 4-6). 30 tablet 0   amoxicillin -clavulanate (AUGMENTIN ) 875-125 MG tablet Take 1 tablet by mouth every 12 (twelve) hours. 10 tablet 0   atorvastatin (LIPITOR) 10 MG tablet Take 10 mg by mouth at bedtime.     azithromycin  (ZITHROMAX ) 250 MG tablet Take 1 tablet (250 mg total) by mouth daily. Take first 2 tablets together, then 1 every day until finished. 6 tablet 0   cetirizine  (ZYRTEC  ALLERGY) 10 MG tablet Take 1 tablet (10 mg  total) by mouth daily. 30 tablet 0   cyclobenzaprine (FLEXERIL) 10 MG tablet Take 10 mg by mouth 3 (three) times daily as needed.     fenofibrate 54 MG tablet Take 54 mg by mouth daily.     fluticasone  (FLONASE ) 50 MCG/ACT nasal spray Place 2 sprays into both nostrils daily. 16 g 12   ibuprofen  (ADVIL ) 600 MG tablet Take 1 tablet (600 mg total) by mouth every 6 (six) hours as needed. 60 tablet 0   lidocaine  (LIDODERM ) 5 % Place 1 patch onto the skin daily. Remove & Discard patch within 12 hours or as directed by MD 30 patch 0   naproxen  (NAPROSYN ) 500 MG tablet Take 1 tablet (500 mg total) by mouth daily as needed for moderate pain (pain score 4-6). 20 tablet 0   oxyCODONE  (OXY IR/ROXICODONE ) 5 MG immediate release tablet Take 5 mg by mouth 4 (four) times daily as needed.     promethazine -dextromethorphan (PROMETHAZINE -DM) 6.25-15 MG/5ML syrup Take 5 mLs by mouth 4 (four) times daily as needed for cough. 118 mL 0   Vitamin D , Ergocalciferol , (DRISDOL ) 1.25 MG (50000 UNIT) CAPS capsule Take 50,000 Units by mouth once a week.     No current facility-administered medications for this visit.    Review of Systems  Constitutional: positive for fatigue Eyes: negative Ears, nose, mouth, throat, and face: negative Respiratory: negative Cardiovascular: negative Gastrointestinal: negative Genitourinary:negative Integument/breast: negative Hematologic/lymphatic: negative Musculoskeletal:negative Neurological: negative Behavioral/Psych: negative Endocrine: negative Allergic/Immunologic: negative  Physical Exam  MJO:jozmu, healthy, no distress, well nourished, and well developed SKIN: skin color, texture, turgor are normal, no rashes or significant lesions HEAD: Normocephalic, No masses, lesions, tenderness or abnormalities EYES: normal, PERRLA EARS: External ears normal, Canals clear OROPHARYNX:no exudate and no erythema  NECK: supple, no adenopathy, no JVD LYMPH:  no palpable  lymphadenopathy, no hepatosplenomegaly BREAST:not examined LUNGS: clear to auscultation , and palpation HEART: regular rate & rhythm, no murmurs, and no gallops ABDOMEN:abdomen soft, non-tender, normal bowel sounds, and no masses or organomegaly BACK: Back symmetric, no curvature., No CVA tenderness EXTREMITIES:no joint deformities, effusion, or inflammation, no edema  NEURO: alert & oriented x 3 with fluent speech, no focal motor/sensory deficits  PERFORMANCE STATUS: ECOG 1  LABORATORY DATA: Lab Results  Component Value Date   WBC  12.7 (H) 01/09/2024   HGB 10.8 (L) 01/09/2024   HCT 29.7 (L) 01/09/2024   MCV 76.0 (L) 01/09/2024   PLT 493 (H) 01/09/2024      Chemistry      Component Value Date/Time   NA 135 10/20/2023 1949   NA 141 01/16/2019 1628   K 3.5 10/20/2023 1949   CL 102 10/20/2023 1949   CO2 20 (L) 10/20/2023 1949   BUN 8 10/20/2023 1949   BUN 6 01/16/2019 1628   CREATININE 1.05 (H) 10/20/2023 1949      Component Value Date/Time   CALCIUM 8.8 (L) 10/20/2023 1949   ALKPHOS 62 10/20/2023 1949   AST 39 10/20/2023 1949   ALT 36 10/20/2023 1949   BILITOT 2.4 (H) 10/20/2023 1949   BILITOT 1.3 (H) 01/16/2019 1628       RADIOGRAPHIC STUDIES: No results found.  ASSESSMENT AND PLAN: This is a very pleasant 33 years old African-American female with microcytic anemia secondary to iron deficiency as well as sickle cell trait.  She is currently started on oral iron supplement and she is responding very well to this treatment. Assessment and Plan Assessment & Plan Iron deficiency anemia Chronic iron deficiency anemia with persistently low hemoglobin and hematocrit over several years, currently improved to 10.8 g/dL. Iron studies demonstrate adequate iron stores and appropriate response to oral supplementation. Menorrhagia and irregular menses are likely contributing factors. No evidence of bleeding outside of menses and no symptoms of severe anemia. Iron infusion is not  indicated given current laboratory values and clinical status. - Continued oral iron supplementation every other day with vitamin C or orange juice to enhance absorption. - Advised against iron infusion due to normal iron studies and adequate response to oral therapy. - Planned repeat complete blood count in three months to monitor hemoglobin and hematocrit. - Requested she bring the name of her iron supplement to the next visit for documentation. - Provided reassurance regarding current management and laboratory improvement.  Sickle cell trait Sickle cell trait, which may contribute to a lower baseline hemoglobin. Hemoglobin levels are consistent with expectations for individuals with sickle cell trait. - Educated her that sickle cell trait is associated with lower baseline hemoglobin. - No additional interventions required at this time. She was advised to call immediately if she has any other concerning symptoms in the interval.  The patient voices understanding of current disease status and treatment options and is in agreement with the current care plan.  All questions were answered. The patient knows to call the clinic with any problems, questions or concerns. We can certainly see the patient much sooner if necessary.  Thank you so much for allowing me to participate in the care of [name]. I will continue to follow up the patient with you and assist in her care.  The total time spent in the appointment was 60 minutes including review of chart and various tests results, discussions about plan of care and coordination of care plan .  Disclaimer: This note was dictated with voice recognition software. Similar sounding words can inadvertently be transcribed and may not be corrected upon review.   Sherrod MARLA Sherrod January 09, 2024, 12:19 PM       [1]  Social History Tobacco Use   Smoking status: Former    Current packs/day: 0.00    Average packs/day: 0.5 packs/day     Types: Cigarettes    Quit date: 02/2017    Years since quitting: 6.8   Smokeless tobacco:  Never  Vaping Use   Vaping status: Never Used  Substance Use Topics   Alcohol use: No    Alcohol/week: 0.0 standard drinks of alcohol   Drug use: No  [2]  Allergies Allergen Reactions   Mushroom Extract Complex (Obsolete) Hives

## 2024-01-09 NOTE — Addendum Note (Signed)
 Addended by: KOLEEN RENSHAW R on: 01/09/2024 03:11 PM   Modules accepted: Orders

## 2024-01-11 ENCOUNTER — Inpatient Hospital Stay: Admitting: Licensed Clinical Social Worker

## 2024-01-11 DIAGNOSIS — D509 Iron deficiency anemia, unspecified: Secondary | ICD-10-CM

## 2024-01-11 LAB — PROTEIN ELECTROPHORESIS, SERUM
A/G Ratio: 1.2 (ref 0.7–1.7)
Albumin ELP: 4.1 g/dL (ref 2.9–4.4)
Alpha-1-Globulin: 0.2 g/dL (ref 0.0–0.4)
Alpha-2-Globulin: 0.6 g/dL (ref 0.4–1.0)
Beta Globulin: 1.3 g/dL (ref 0.7–1.3)
Gamma Globulin: 1.3 g/dL (ref 0.4–1.8)
Globulin, Total: 3.5 g/dL (ref 2.2–3.9)
Total Protein ELP: 7.6 g/dL (ref 6.0–8.5)

## 2024-01-11 NOTE — Progress Notes (Signed)
 [facility] CSW Progress Note    Interventions: CSW received a referral to contact pt regarding anxiety/depression and resources.  CSW called the number on file to reach pt.  The call could not be completed as dialed.  Resources sent to pt via the email on file.        Follow Up Plan:  Patient will contact CSW with any support or resource needs    Devere JONELLE Manna, LCSW Clinical Social Worker [facility]    Patient is participating in a Managed Medicaid Plan:  Yes

## 2024-04-15 ENCOUNTER — Inpatient Hospital Stay

## 2024-04-15 ENCOUNTER — Inpatient Hospital Stay: Admitting: Internal Medicine
